# Patient Record
Sex: Female | Born: 1990 | Race: White | Hispanic: No | Marital: Single | Smoking: Current every day smoker
Health system: Southern US, Community
[De-identification: ages and names within clinical notes are randomized; demographics above are authoritative.]

## PROBLEM LIST (undated history)

## (undated) ENCOUNTER — Emergency Department (HOSPITAL_BASED_OUTPATIENT_CLINIC_OR_DEPARTMENT_OTHER): Admission: EM | Payer: Self-pay | Source: Home / Self Care

## (undated) DIAGNOSIS — F4310 Post-traumatic stress disorder, unspecified: Secondary | ICD-10-CM

## (undated) DIAGNOSIS — R569 Unspecified convulsions: Secondary | ICD-10-CM

## (undated) DIAGNOSIS — G039 Meningitis, unspecified: Secondary | ICD-10-CM

## (undated) DIAGNOSIS — G9389 Other specified disorders of brain: Secondary | ICD-10-CM

## (undated) DIAGNOSIS — F319 Bipolar disorder, unspecified: Secondary | ICD-10-CM

## (undated) DIAGNOSIS — F419 Anxiety disorder, unspecified: Secondary | ICD-10-CM

## (undated) HISTORY — PX: WISDOM TOOTH EXTRACTION: SHX21

---
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
## 2016-10-10 ENCOUNTER — Emergency Department (HOSPITAL_COMMUNITY): Payer: BLUE CROSS/BLUE SHIELD

## 2016-10-10 ENCOUNTER — Encounter (HOSPITAL_COMMUNITY): Payer: Self-pay | Admitting: Emergency Medicine

## 2016-10-10 ENCOUNTER — Emergency Department (HOSPITAL_COMMUNITY)
Admission: EM | Admit: 2016-10-10 | Discharge: 2016-10-11 | Disposition: A | Discharge status: Home / Self Care | Payer: BLUE CROSS/BLUE SHIELD | Attending: Emergency Medicine | Admitting: Emergency Medicine

## 2016-10-10 DIAGNOSIS — Y92410 Unspecified street and highway as the place of occurrence of the external cause: Secondary | ICD-10-CM | POA: Insufficient documentation

## 2016-10-10 DIAGNOSIS — M542 Cervicalgia: Secondary | ICD-10-CM

## 2016-10-10 DIAGNOSIS — M546 Pain in thoracic spine: Secondary | ICD-10-CM | POA: Insufficient documentation

## 2016-10-10 DIAGNOSIS — R1011 Right upper quadrant pain: Secondary | ICD-10-CM | POA: Diagnosis not present

## 2016-10-10 DIAGNOSIS — R0789 Other chest pain: Secondary | ICD-10-CM | POA: Insufficient documentation

## 2016-10-10 DIAGNOSIS — Y9389 Activity, other specified: Secondary | ICD-10-CM | POA: Insufficient documentation

## 2016-10-10 DIAGNOSIS — S7001XA Contusion of right hip, initial encounter: Secondary | ICD-10-CM | POA: Insufficient documentation

## 2016-10-10 DIAGNOSIS — Y999 Unspecified external cause status: Secondary | ICD-10-CM | POA: Insufficient documentation

## 2016-10-10 DIAGNOSIS — R1031 Right lower quadrant pain: Secondary | ICD-10-CM | POA: Diagnosis not present

## 2016-10-10 DIAGNOSIS — J982 Interstitial emphysema: Secondary | ICD-10-CM | POA: Diagnosis not present

## 2016-10-10 DIAGNOSIS — S0990XA Unspecified injury of head, initial encounter: Secondary | ICD-10-CM | POA: Diagnosis present

## 2016-10-10 HISTORY — DX: Anxiety disorder, unspecified: F41.9

## 2016-10-10 HISTORY — DX: Meningitis, unspecified: G03.9

## 2016-10-10 LAB — PREGNANCY, URINE: Preg Test, Ur: NEGATIVE
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
## 2016-10-10 MED ORDER — MORPHINE SULFATE (PF) 4 MG/ML IV SOLN
4.0000 mg | Freq: Once | INTRAVENOUS | Status: AC
  Administered 2016-10-10: 4 mg via INTRAVENOUS
  Filled 2016-10-10: qty 1

## 2016-10-10 MED ORDER — IOPAMIDOL (ISOVUE-300) INJECTION 61%
100.0000 mL | Freq: Once | INTRAVENOUS | Status: AC | PRN
  Administered 2016-10-10: 100 mL via INTRAVENOUS
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
## 2016-10-11 MED ORDER — HYDROCODONE-ACETAMINOPHEN 5-325 MG PO TABS: 2.0000 | ORAL_TABLET | ORAL | 0 refills | Status: DC | PRN

## 2016-10-11 MED ORDER — NAPROXEN 500 MG PO TABS: 500.0000 mg | ORAL_TABLET | Freq: Two times a day (BID) | ORAL | 0 refills | Status: DC

## 2016-10-11 MED ORDER — CYCLOBENZAPRINE HCL 10 MG PO TABS: 10.0000 mg | ORAL_TABLET | Freq: Two times a day (BID) | ORAL | 0 refills | Status: DC | PRN

## 2016-10-11 MED ORDER — MORPHINE SULFATE (PF) 4 MG/ML IV SOLN
4.0000 mg | Freq: Once | INTRAVENOUS | Status: AC
  Administered 2016-10-11: 4 mg via INTRAVENOUS
  Filled 2016-10-11: qty 1
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
## 2016-10-11 NOTE — Discharge Instructions (Signed)
Your xrays are negative for any sign of broken bones. You do have a small pneumomediastinum which should resolve on its own. Apply ice to affected area. Increase your mobility. Take Naprosyn and Flexeril as needed for pain and inflammation. Follow up with your primary care provider if your symptoms do not improve. Return to the ED if you experience loss of consciousness, blurry vision, vomiting, loss of control of your bowel and bladder, numbness or tingling in your lower extremities, difficulty breathing/swallowing, worsening chest pain.

## 2016-10-11 NOTE — ED Notes (Signed)
Pt ambulated in hallway with standby assist. Pt tolerated well. Pt had complaints of pain and stiffness on the left side of neck, but was able to move all extremities.

## 2017-03-16 ENCOUNTER — Emergency Department (HOSPITAL_COMMUNITY)
Admission: EM | Admit: 2017-03-16 | Discharge: 2017-03-17 | Disposition: A | Discharge status: Home / Self Care | Payer: BLUE CROSS/BLUE SHIELD | Attending: Emergency Medicine | Admitting: Emergency Medicine

## 2017-03-16 ENCOUNTER — Encounter (HOSPITAL_COMMUNITY): Payer: Self-pay | Admitting: Emergency Medicine

## 2017-03-16 DIAGNOSIS — S92424A Nondisplaced fracture of distal phalanx of right great toe, initial encounter for closed fracture: Secondary | ICD-10-CM | POA: Diagnosis not present

## 2017-03-16 DIAGNOSIS — Y929 Unspecified place or not applicable: Secondary | ICD-10-CM | POA: Diagnosis not present

## 2017-03-16 DIAGNOSIS — Y9389 Activity, other specified: Secondary | ICD-10-CM | POA: Insufficient documentation

## 2017-03-16 DIAGNOSIS — S99921A Unspecified injury of right foot, initial encounter: Secondary | ICD-10-CM | POA: Diagnosis present

## 2017-03-16 DIAGNOSIS — W2203XA Walked into furniture, initial encounter: Secondary | ICD-10-CM | POA: Diagnosis not present

## 2017-03-16 DIAGNOSIS — Y999 Unspecified external cause status: Secondary | ICD-10-CM | POA: Insufficient documentation

## 2017-03-16 MED ORDER — OXYCODONE-ACETAMINOPHEN 5-325 MG PO TABS
1.0000 | ORAL_TABLET | ORAL | Status: DC | PRN
  Administered 2017-03-17: 1 via ORAL

## 2017-03-16 MED ORDER — OXYCODONE-ACETAMINOPHEN 5-325 MG PO TABS
ORAL_TABLET | ORAL | Status: DC
Start: 2017-03-16 — End: 2017-03-17
  Filled 2017-03-16: qty 1
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
## 2017-03-17 ENCOUNTER — Emergency Department (HOSPITAL_COMMUNITY): Payer: BLUE CROSS/BLUE SHIELD

## 2017-03-17 IMAGING — DX DG FOOT COMPLETE 3+V*R*
3 series · 3 of 3 positions shown · non-contrast
Comparison: None.

CLINICAL DATA: Great toe injury

EXAM:
RIGHT FOOT COMPLETE - 3+ VIEW

[foot ap]
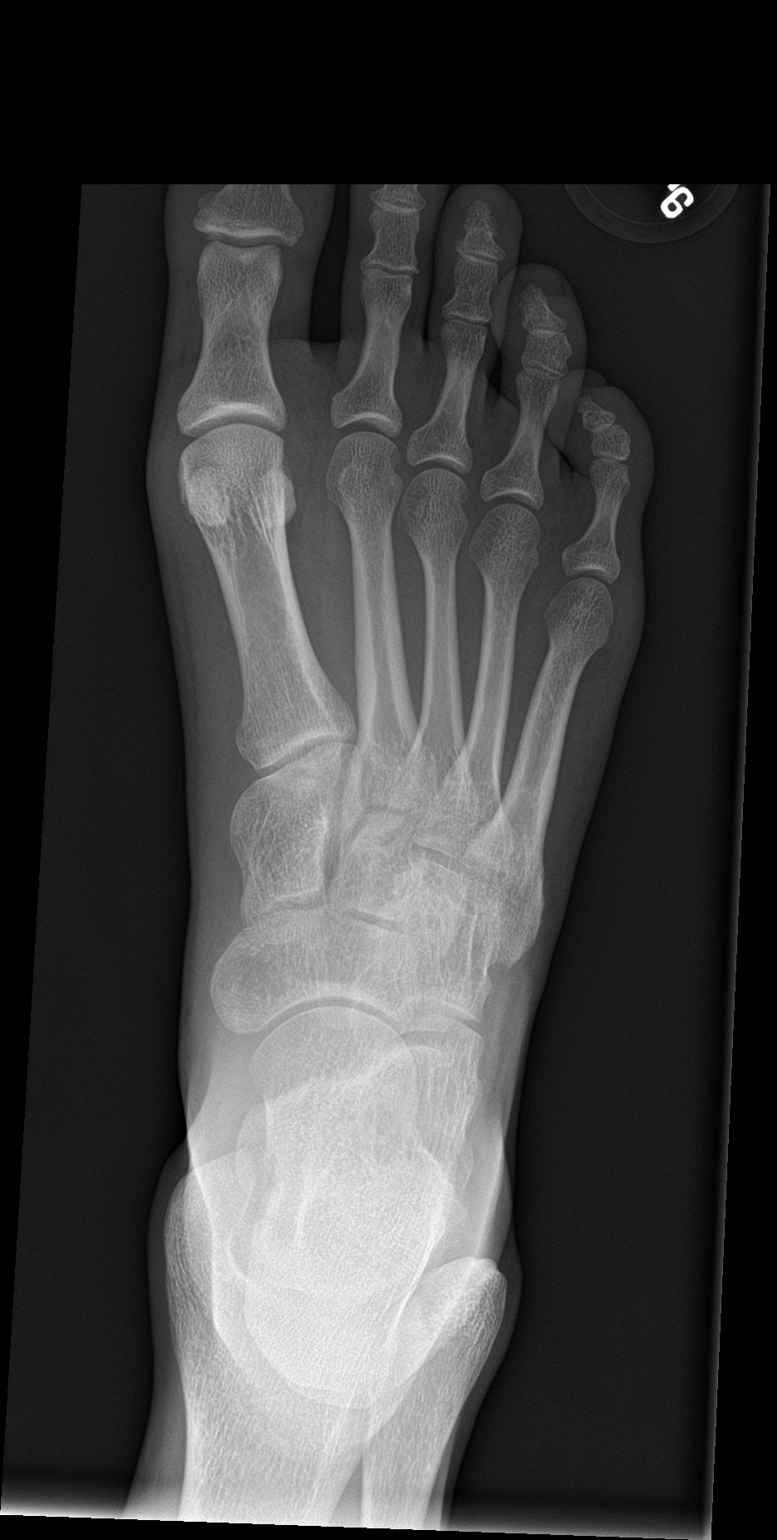

[foot obl]
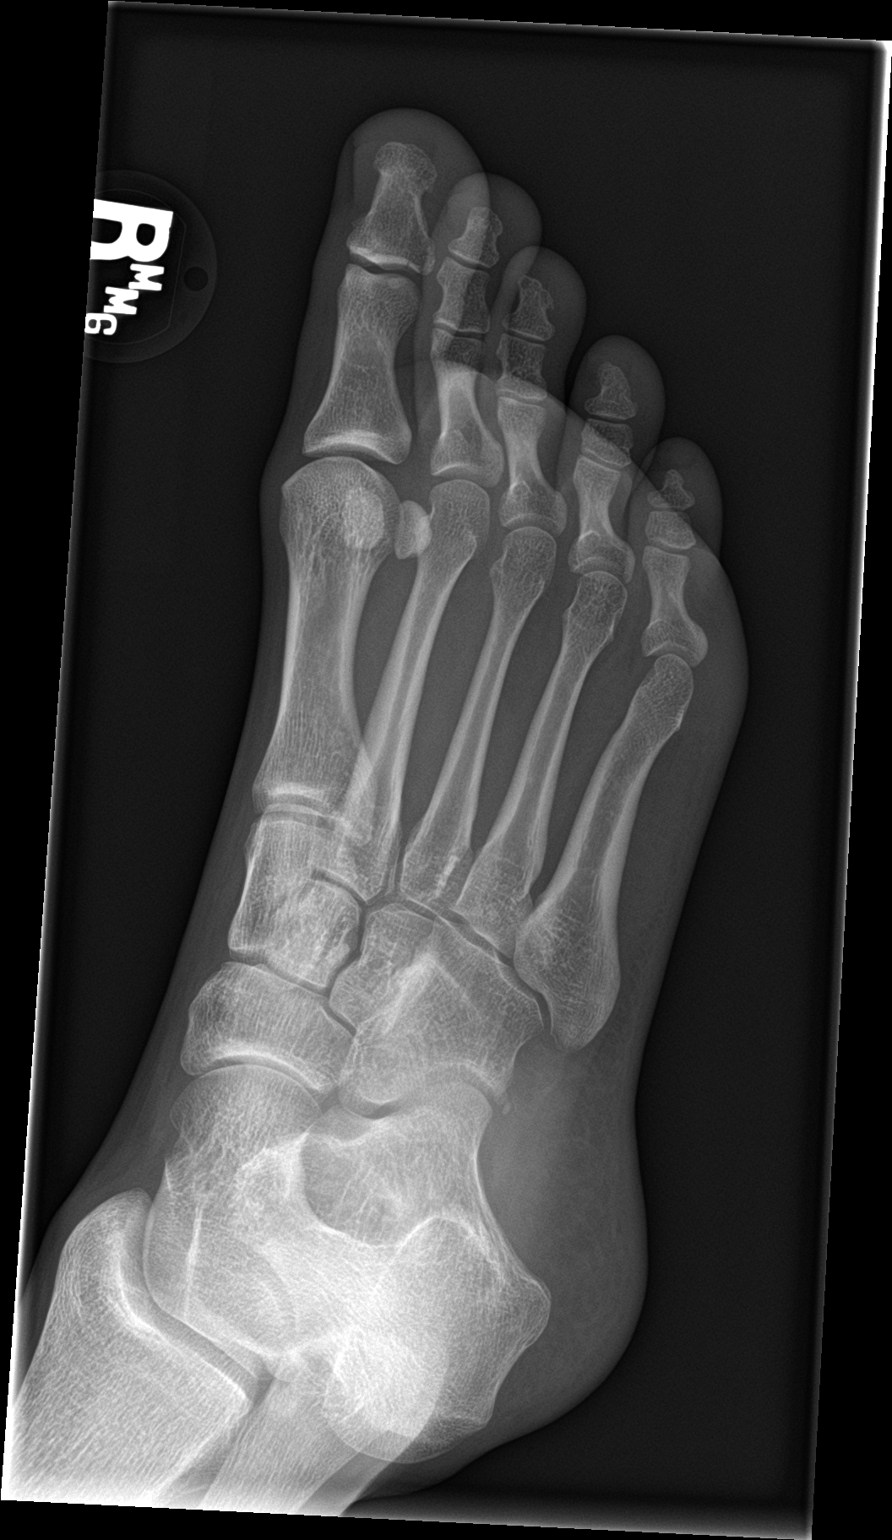

[foot lat]
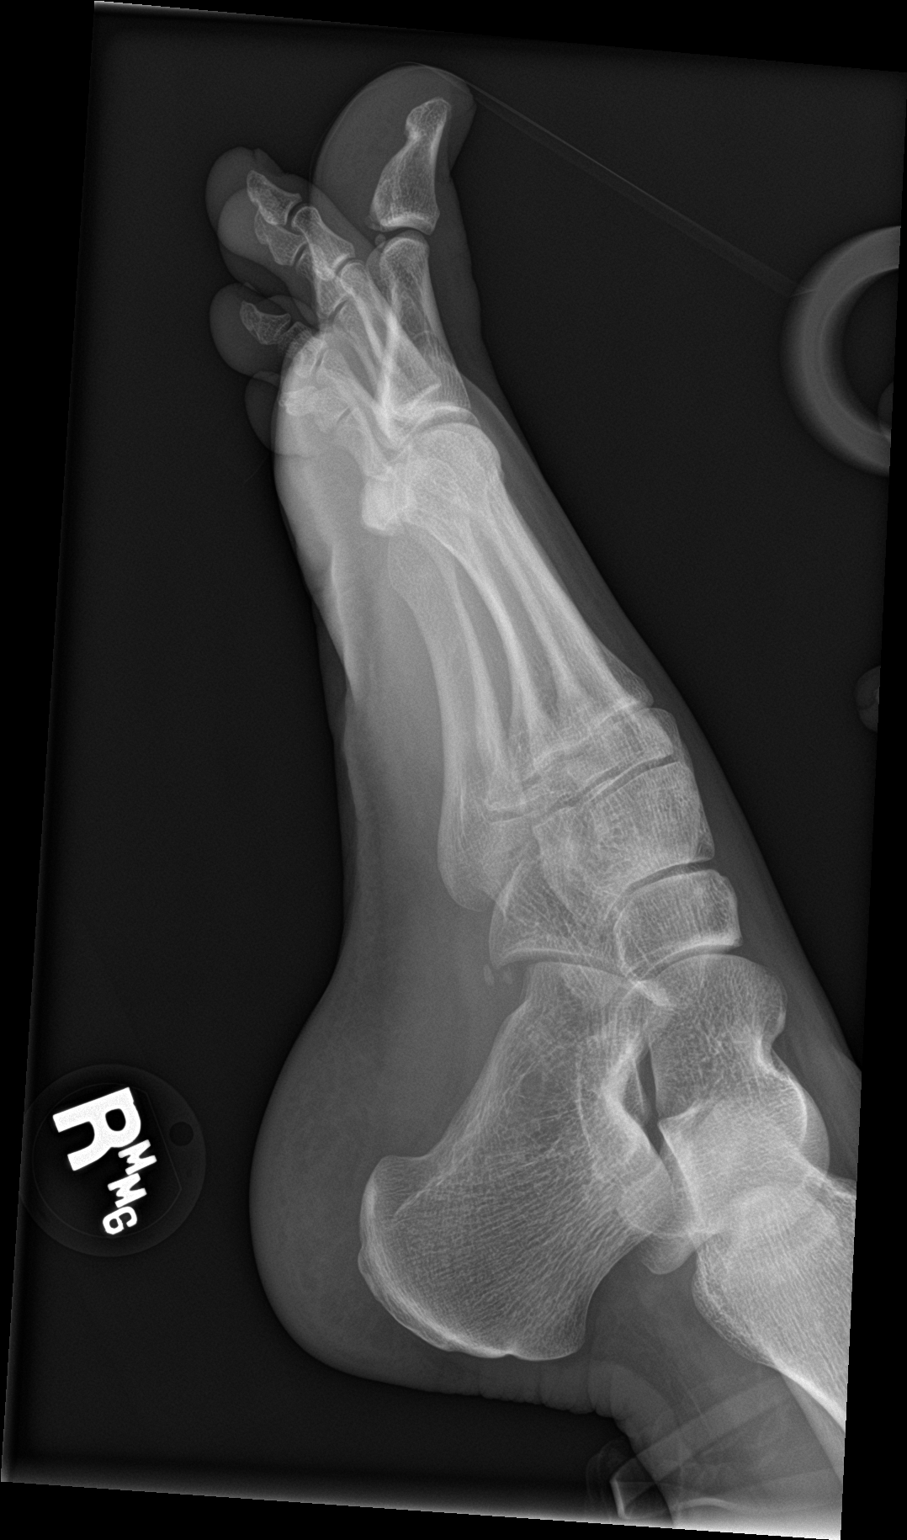

[3 of 3 positions shown; findings below may reference images not displayed]

FINDINGS: Nondisplaced intra-articular fracture involving the lateral aspect
of the first distal phalanx. No subluxation. No radiopaque foreign
body.
IMPRESSION: Nondisplaced intra-articular fracture involving the base of the
first distal phalanx

## 2017-03-17 MED ORDER — OXYCODONE-ACETAMINOPHEN 5-325 MG PO TABS: 1.0000 | ORAL_TABLET | Freq: Three times a day (TID) | ORAL | 0 refills | Status: DC | PRN

## 2017-03-17 NOTE — ED Notes (Signed)
Patient transported to X-ray 

## 2017-03-17 NOTE — ED Notes (Signed)
ED Provider at bedside. 

## 2017-03-17 NOTE — Progress Notes (Signed)
Orthopedic Tech Progress Note Patient Details:  Sonya Prince 17-Dec-1990 [id number]  Ortho Devices Type of Ortho Device: Buddy tape, Postop shoe/boot Ortho Device/Splint Location: rle Ortho Device/Splint Interventions: Ordered, Application   Karolee Stamps 03/17/2017, 2:01 AM

## 2017-03-17 NOTE — ED Notes (Signed)
Pt was wheeled to fast track; before pt got to room pt states she does not like hospitals and was going to leave; Pt took of BP cuff and signed AMA; Pt got half way down the hallway and Yasemia,RN brought pt back; pt is in tears and states she will stay; Pt redirected to assessment room;

## 2017-03-17 NOTE — ED Notes (Signed)
Pt refused crutches at discharge

## 2017-03-17 NOTE — ED Notes (Signed)
Pt request family member to stay in waiting room; Pt very tearful

## 2017-03-17 NOTE — ED Provider Notes (Signed)
[facility] DEPT Provider Note   CSN: [id number] Arrival date & time: 03/16/17  2341     History   Chief Complaint Chief Complaint  Patient presents with  . Toe Pain    HPI Sonya Prince is a 26 y.o. female.  The history is provided by the patient. No language interpreter was used.  Toe Pain  This is a new problem. The current episode started yesterday. The problem occurs constantly. The problem has not changed since onset.She has tried rest (naproxen) for the symptoms. The treatment provided no relief.    Past Medical History:  Diagnosis Date  . Anxiety   . Meningitis     There are no active problems to display for this patient.   Past Surgical History:  Procedure Laterality Date  . WISDOM TOOTH EXTRACTION      OB History    No data available       Home Medications    Prior to Admission medications   Medication Sig Start Date End Date Taking? Authorizing Provider  cyclobenzaprine (FLEXERIL) 10 MG tablet Take 1 tablet (10 mg total) by mouth 2 (two) times daily as needed for muscle spasms. 10/11/16   Samantha Tripp Dowless, PA-C  HYDROcodone-acetaminophen (NORCO/VICODIN) 5-325 MG tablet Take 2 tablets by mouth every 4 (four) hours as needed. 10/11/16   Samantha Tripp Dowless, PA-C  naproxen (NAPROSYN) 500 MG tablet Take 1 tablet (500 mg total) by mouth 2 (two) times daily. 10/11/16   Carlos Levering, PA-C    Family History History reviewed. No pertinent family history.  Social History Social History  Substance Use Topics  . Smoking status: Never Smoker  . Smokeless tobacco: Never Used  . Alcohol use No     Comment: occassionally     Allergies   Patient has no known allergies.   Review of Systems Review of Systems  Musculoskeletal: Positive for arthralgias.  All other systems reviewed and are negative.    Physical Exam Updated Vital Signs BP 121/79 (BP Location: Right Arm)   Pulse 88   Temp 98.3 F (36.8 C) (Oral)   Resp 18    Ht 5\' 2"  (1.575 m)   Wt 46.7 kg   LMP 02/03/2017   SpO2 100%   BMI 18.84 kg/m   Physical Exam  Constitutional: She is oriented to person, place, and time. She appears well-developed and well-nourished.  HENT:  Head: Normocephalic.  Eyes: Conjunctivae are normal.  Neck: Neck supple.  Cardiovascular: Normal rate and regular rhythm.   Pulmonary/Chest: Effort normal and breath sounds normal.  Abdominal: Soft.  Musculoskeletal: She exhibits tenderness.       Right foot: There is tenderness.       Feet:  Lymphadenopathy:    She has no cervical adenopathy.  Neurological: She is alert and oriented to person, place, and time.  Skin: Skin is warm and dry.  Psychiatric: She has a normal mood and affect.  Nursing note and vitals reviewed.    ED Treatments / Results  Labs (all labs ordered are listed, but only abnormal results are displayed) Labs Reviewed - No data to display  EKG  EKG Interpretation None       Radiology Dg Foot Complete Right  Result Date: 03/17/2017 CLINICAL DATA:  Great toe injury EXAM: RIGHT FOOT COMPLETE - 3+ VIEW COMPARISON:  None. FINDINGS: Nondisplaced intra-articular fracture involving the lateral aspect of the first distal phalanx. No subluxation. No radiopaque foreign body. IMPRESSION: Nondisplaced intra-articular fracture involving the base of  the first distal phalanx Electronically Signed   By: Donavan Foil M.D.   On: 03/17/2017 01:15    Procedures Procedures (including critical care time)  SPLINT APPLICATION Date/Time: 2:63 AM Authorized by: Norman Herrlich Consent: Verbal consent obtained. Risks and benefits: risks, benefits and alternatives were discussed Consent given by: patient Splint applied by: Orthopedic technician Location details: distal phalanx right great toe Splint type: buddy taped  1:54 AM  Post-procedure: The splinted body part was neurovascularly unchanged following the procedure. Patient tolerance: Patient tolerated  the procedure well with no immediate complications.    Medications Ordered in ED Medications  oxyCODONE-acetaminophen (PERCOCET/ROXICET) 5-325 MG per tablet 1 tablet (1 tablet Oral Given 03/17/17 0005)  oxyCODONE-acetaminophen (PERCOCET/ROXICET) 5-325 MG per tablet (not administered)     Initial Impression / Assessment and Plan / ED Course  I have reviewed the triage vital signs and the nursing notes.  Pertinent labs & imaging results that were available during my care of the patient were reviewed by me and considered in my medical decision making (see chart for details).     Patient with non-displaced intra-articular distal phalanx fracture of the right great toe. Buddy tape, post-op shoe, crutches with non-weight bearing.  Pt advised to follow up with orthopedics. Care instructions provided.  Patient will be discharged home & is agreeable with above plan. Returns precautions discussed. Pt appears safe for discharge.  Final Clinical Impressions(s) / ED Diagnoses   Final diagnoses:  Closed nondisplaced fracture of distal phalanx of right great toe, initial encounter    New Prescriptions New Prescriptions   OXYCODONE-ACETAMINOPHEN (PERCOCET/ROXICET) 5-325 MG TABLET    Take 1 tablet by mouth every 8 (eight) hours as needed for severe pain.     [name], NP 03/17/17 7858    Merrily Pew, MD 03/17/17 1154

## 2017-04-09 ENCOUNTER — Emergency Department (HOSPITAL_COMMUNITY)
Admission: EM | Admit: 2017-04-09 | Discharge: 2017-04-09 | Disposition: A | Discharge status: Home / Self Care | Payer: BLUE CROSS/BLUE SHIELD | Attending: Emergency Medicine | Admitting: Emergency Medicine

## 2017-04-09 ENCOUNTER — Encounter (HOSPITAL_COMMUNITY): Payer: Self-pay

## 2017-04-09 ENCOUNTER — Emergency Department (HOSPITAL_COMMUNITY): Payer: BLUE CROSS/BLUE SHIELD

## 2017-04-09 DIAGNOSIS — M79671 Pain in right foot: Secondary | ICD-10-CM

## 2017-04-09 DIAGNOSIS — Y92009 Unspecified place in unspecified non-institutional (private) residence as the place of occurrence of the external cause: Secondary | ICD-10-CM | POA: Insufficient documentation

## 2017-04-09 DIAGNOSIS — M25561 Pain in right knee: Secondary | ICD-10-CM

## 2017-04-09 DIAGNOSIS — Y999 Unspecified external cause status: Secondary | ICD-10-CM | POA: Insufficient documentation

## 2017-04-09 DIAGNOSIS — Y939 Activity, unspecified: Secondary | ICD-10-CM | POA: Insufficient documentation

## 2017-04-09 IMAGING — DX DG FOOT COMPLETE 3+V*R*
3 series · 3 of 3 positions shown · non-contrast
Comparison: Right foot radiograph [DATE]

CLINICAL DATA: Right toe pain.

EXAM:
RIGHT FOOT COMPLETE - 3+ VIEW

[foot ap]
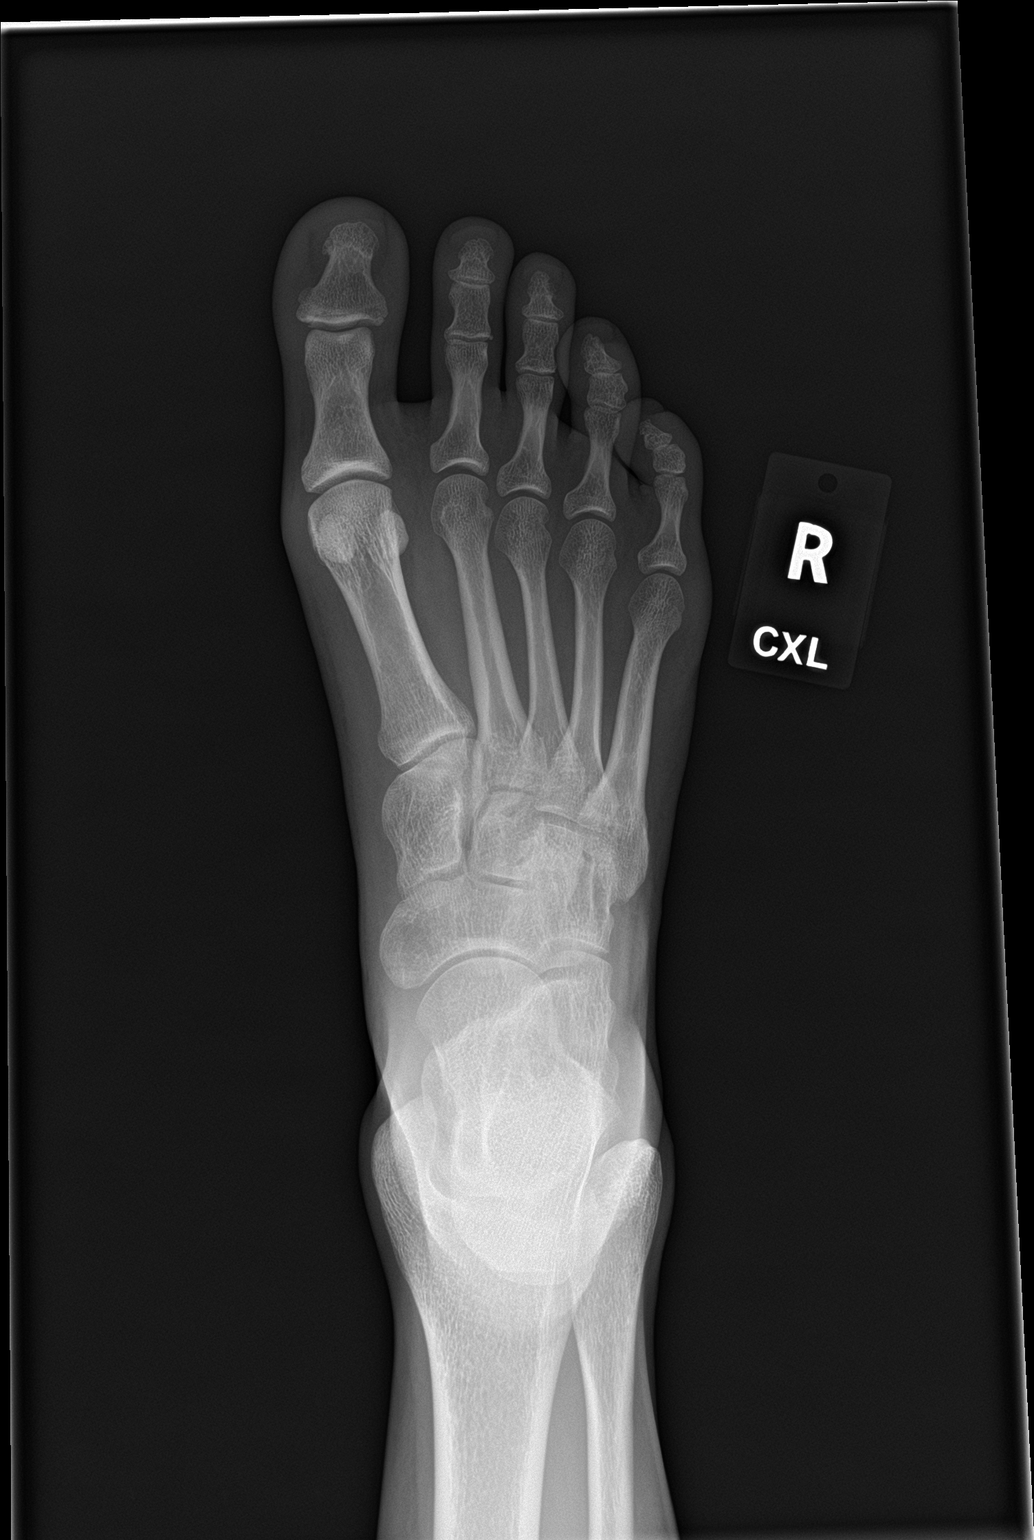

[foot obl]
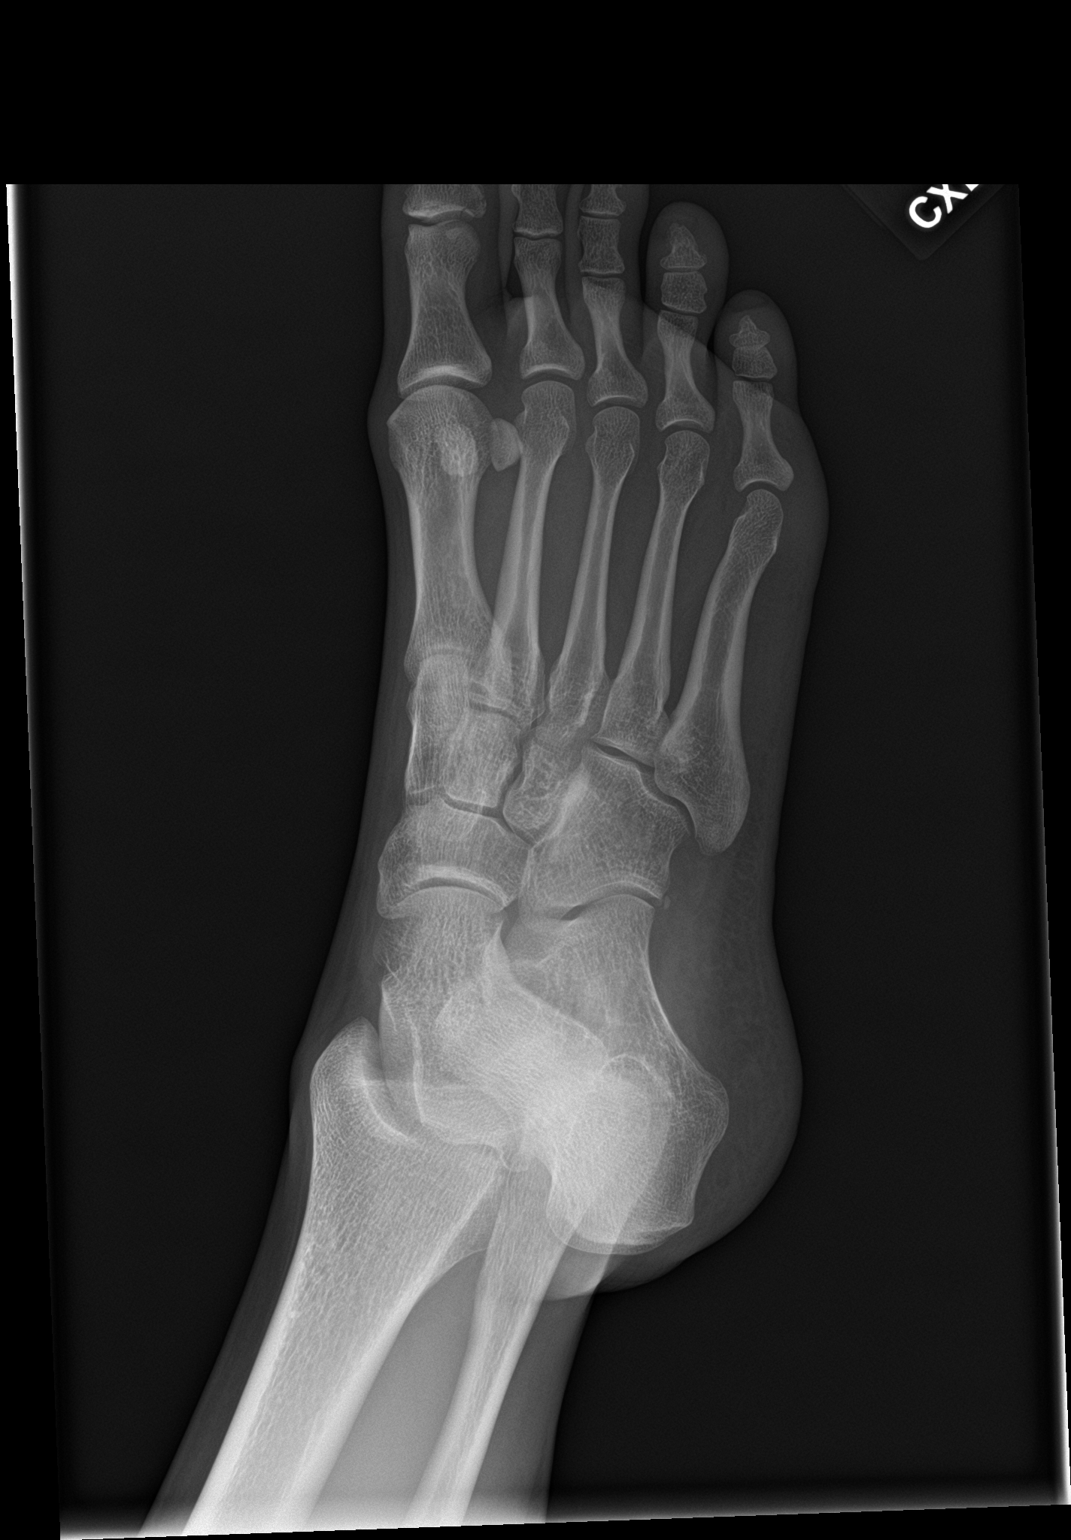

[foot lat]
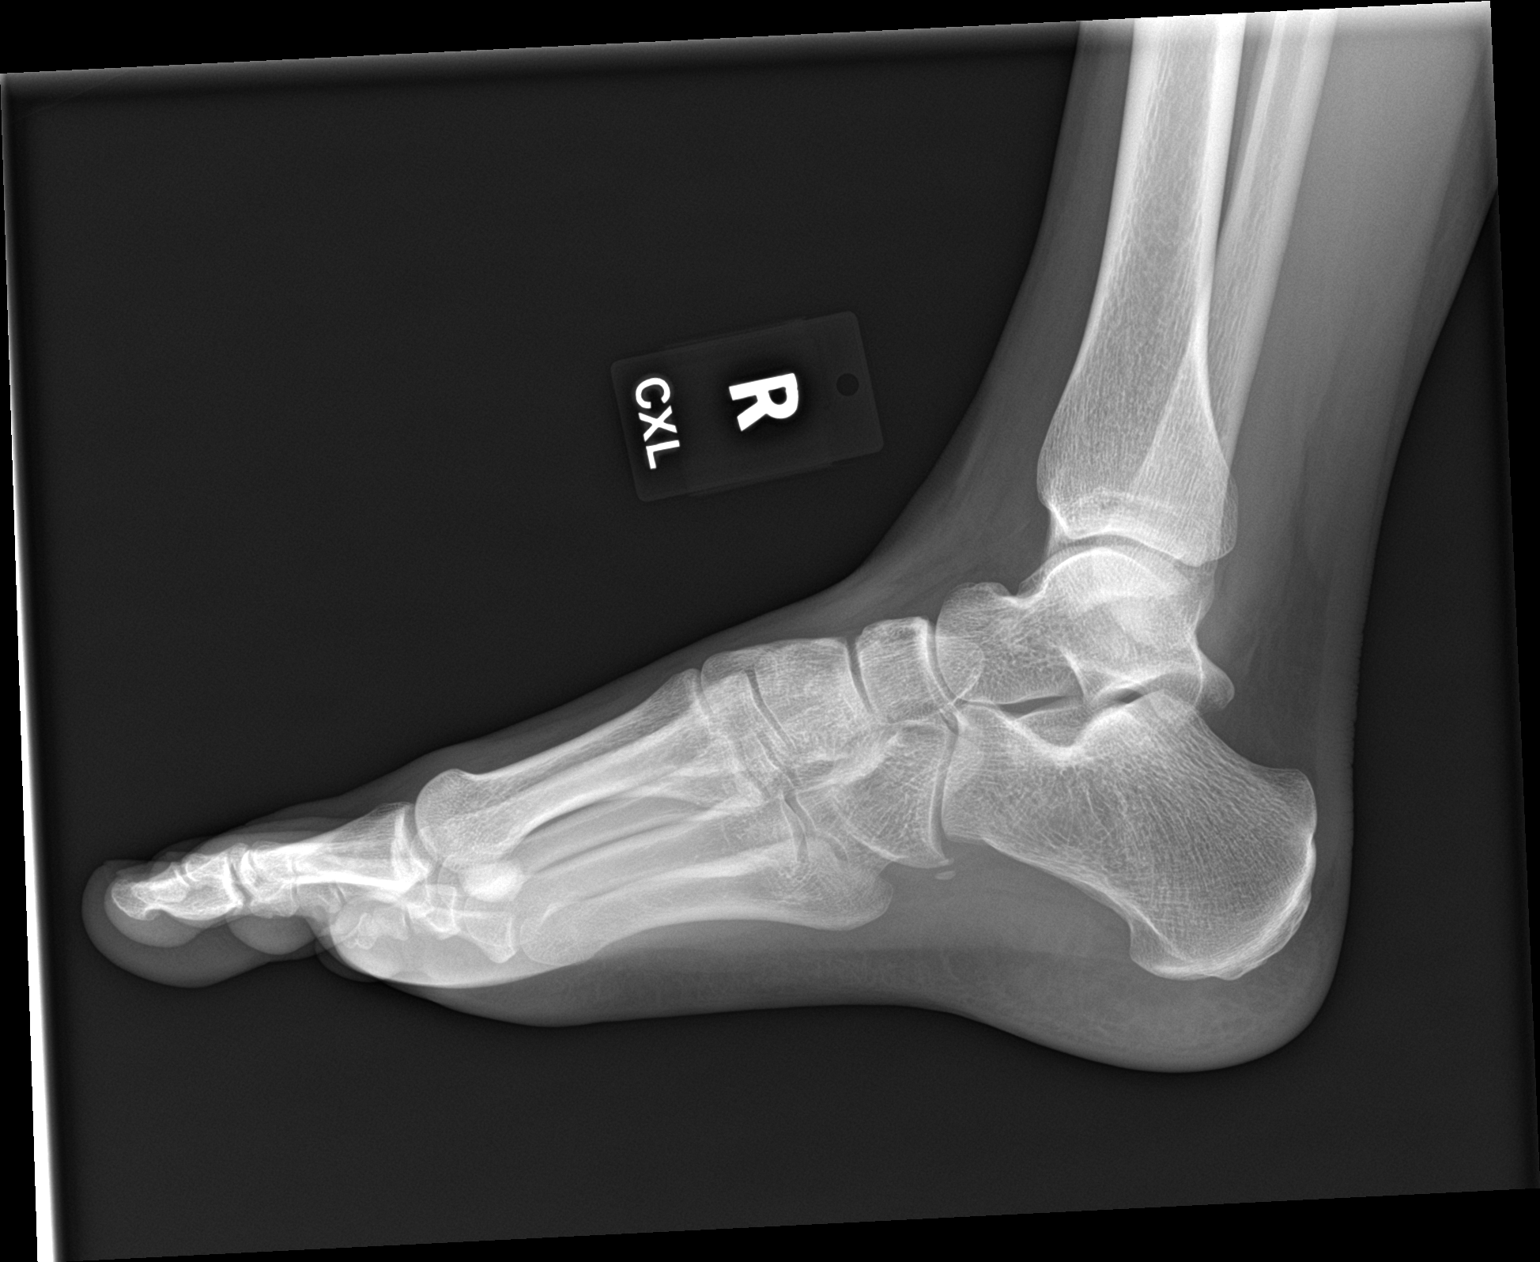

[3 of 3 positions shown; findings below may reference images not displayed]

FINDINGS: Nondisplaced intra-articular fracture at the base of the first
distal phalanx is unchanged. There is no new fracture. The remainder
of the bones of the right foot are normal.
IMPRESSION: Unchanged appearance of nondisplaced intra-articular fracture of the
base of the first distal phalanx of the right foot. No new fracture.
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
## 2017-04-09 MED ORDER — NAPROXEN 375 MG PO TABS: 375.0000 mg | ORAL_TABLET | Freq: Two times a day (BID) | ORAL | 0 refills | Status: DC

## 2017-04-09 MED ORDER — CYCLOBENZAPRINE HCL 10 MG PO TABS: 10.0000 mg | ORAL_TABLET | Freq: Two times a day (BID) | ORAL | 0 refills | Status: DC | PRN

## 2017-04-09 MED ORDER — HYDROCODONE-ACETAMINOPHEN 5-325 MG PO TABS: 1.0000 | ORAL_TABLET | Freq: Four times a day (QID) | ORAL | 0 refills | Status: DC | PRN

## 2017-04-09 MED ORDER — NAPROXEN 250 MG PO TABS
375.0000 mg | ORAL_TABLET | Freq: Once | ORAL | Status: AC
  Administered 2017-04-09: 375 mg via ORAL
  Filled 2017-04-09: qty 2
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
## 2017-04-09 NOTE — Progress Notes (Signed)
Orthopedic Tech Progress Note Patient Details:  Sonya Prince 1991/07/11 [id number]  Ortho Devices Type of Ortho Device: Knee Immobilizer Ortho Device/Splint Location: RLE Ortho Device/Splint Interventions: Ordered, Application   Braulio Bosch 04/09/2017, 5:33 PM
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
## 2017-04-09 NOTE — ED Notes (Addendum)
NP at bedside.

## 2017-04-16 ENCOUNTER — Ambulatory Visit (HOSPITAL_COMMUNITY)
Admission: EM | Admit: 2017-04-16 | Discharge: 2017-04-16 | Disposition: A | Discharge status: Home / Self Care | Payer: BLUE CROSS/BLUE SHIELD | Attending: Internal Medicine | Admitting: Internal Medicine

## 2017-04-16 ENCOUNTER — Encounter (HOSPITAL_COMMUNITY): Payer: Self-pay | Admitting: *Deleted

## 2017-04-16 DIAGNOSIS — S61411A Laceration without foreign body of right hand, initial encounter: Secondary | ICD-10-CM | POA: Diagnosis not present

## 2017-04-16 DIAGNOSIS — S61219A Laceration without foreign body of unspecified finger without damage to nail, initial encounter: Secondary | ICD-10-CM

## 2017-04-16 MED ORDER — LIDOCAINE-EPINEPHRINE-TETRACAINE (LET) SOLUTION
3.0000 mL | Freq: Once | NASAL | Status: AC
  Administered 2017-04-16: 3 mL via TOPICAL

## 2017-04-16 MED ORDER — LIDOCAINE-EPINEPHRINE-TETRACAINE (LET) SOLUTION
NASAL | Status: AC
  Filled 2017-04-16: qty 3

## 2017-04-16 MED ORDER — LIDOCAINE HCL 2 % IJ SOLN
INTRAMUSCULAR | Status: AC
  Filled 2017-04-16: qty 20
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
## 2017-04-16 NOTE — Discharge Instructions (Addendum)
Finger wounds were cleaned today.  The wounds are small and will heal fine.  Wash gently with soap/water 1-2 times daily and apply antibiotic ointment and bandage.  Press firmly for 5-10 minutes, without letting up, to control bleeding, if bleeding occurs in the next 24 hours or so.  Recheck for any increasing redness/swelling/drainage/pain or new fever >100.5.  Anticipate gradual healing over the couple weeks.

## 2017-04-19 NOTE — ED Provider Notes (Signed)
[facility]    CSN: [id number] Arrival date & time: 04/16/17  1443     History   Chief Complaint Chief Complaint  Patient presents with  . Laceration    HPI Sonya Prince is a 26 y.o. female.  Presents today with small lacerations to dorsal right 3rd and 4th PIPs with potato peeler.  Distraught.  No bruising/swelling.  Good ROM hand.  Expresses a lot of pain.  No other injuries reported.    HPI  Past Medical History:  Diagnosis Date  . Anxiety   . Meningitis     Past Surgical History:  Procedure Laterality Date  . WISDOM TOOTH EXTRACTION       Home Medications    Prior to Admission medications   Medication Sig Start Date End Date Taking? Authorizing Provider  cyclobenzaprine (FLEXERIL) 10 MG tablet Take 1 tablet (10 mg total) by mouth 2 (two) times daily as needed for muscle spasms. 04/09/17   [name], NP  HYDROcodone-acetaminophen (NORCO/VICODIN) 5-325 MG tablet Take 1 tablet by mouth every 6 (six) hours as needed for severe pain. 04/09/17   [name], NP  naproxen (NAPROSYN) 375 MG tablet Take 1 tablet (375 mg total) by mouth 2 (two) times daily. 04/09/17   [name], NP  oxyCODONE-acetaminophen (PERCOCET/ROXICET) 5-325 MG tablet Take 1 tablet by mouth every 8 (eight) hours as needed for severe pain. 03/17/17   [name], NP    Family History History reviewed. No pertinent family history.  Social History Social History  Substance Use Topics  . Smoking status: Never Smoker  . Smokeless tobacco: Never Used  . Alcohol use No     Comment: occassionally     Allergies   Patient has no known allergies.   Review of Systems Review of Systems  All other systems reviewed and are negative.    Physical Exam Triage Vital Signs ED Triage Vitals  Enc Vitals Group     BP 04/16/17 1538 (!) 115/94     Pulse Rate 04/16/17 1538 97     Resp 04/16/17 1538 16     Temp 04/16/17 1538 98.3 F (36.8 C)     Temp Source 04/16/17 1538 Oral   SpO2 04/16/17 1538 100 %     Weight --      Height --      Pain Score 04/16/17 1543 3     Pain Loc --    Updated Vital Signs BP (!) 115/94 (BP Location: Left Arm) Comment: rn notified  Pulse 97   Temp 98.3 F (36.8 C) (Oral)   Resp 16   LMP 04/02/2017 (Approximate)   SpO2 100%   Physical Exam  Constitutional: She is oriented to person, place, and time. No distress.  HENT:  Head: Atraumatic.  Eyes:  Conjugate gaze observed, no eye redness/discharge  Neck: Neck supple.  Cardiovascular: Normal rate.   Pulmonary/Chest: No respiratory distress.  Abdominal: She exhibits no distension.  Musculoskeletal: Normal range of motion.  Neurological: She is alert and oriented to person, place, and time.  Skin: Skin is warm and dry.  Able to move hand/fingers/wrist.  Fingers warm with good cap refill.  No bruising/swelling.   Small full thickness avulsions to dorsal right 3rd, 4th PIPs, 81mm at 4th finger, 7-58mm at 3rd finger.    Nursing note and vitals reviewed.    UC Treatments / Results   Procedures Procedures (including critical care time)  Medications Ordered in UC Medications  lidocaine-EPINEPHrine-tetracaine (LET) solution (3 mLs  Topical Given 04/16/17 1608)   Wounds washed thoroughly with hibiclens/saline.  Scant debris removed from 3rd finger wound.  Patient not able to tolerate wound infiltration with lidocaine/suturing.  Antibiotic ointment/bulky dressings applied.    Final Clinical Impressions(s) / UC Diagnoses   Final diagnoses:  Finger laceration, initial encounter   Finger wounds were cleaned today.  The wounds are small and will heal fine.  Wash gently with soap/water 1-2 times daily and apply antibiotic ointment and bandage.  Press firmly for 5-10 minutes, without letting up, to control bleeding, if bleeding occurs in the next 24 hours or so.  Recheck for any increasing redness/swelling/drainage/pain or new fever >100.5.  Anticipate gradual healing over the couple  weeks.   Sherlene Shams, MD 04/19/17 [phone]

## 2017-07-04 ENCOUNTER — Encounter (HOSPITAL_COMMUNITY): Payer: Self-pay | Admitting: Emergency Medicine

## 2017-07-04 ENCOUNTER — Ambulatory Visit (HOSPITAL_COMMUNITY)
Admission: AD | Admit: 2017-07-04 | Discharge: 2017-07-04 | Disposition: A | Discharge status: Home / Self Care | Payer: Self-pay | Attending: Psychiatry | Admitting: Psychiatry

## 2017-07-04 ENCOUNTER — Emergency Department (HOSPITAL_COMMUNITY)
Admission: EM | Admit: 2017-07-04 | Discharge: 2017-07-05 | Disposition: A | Discharge status: Home / Self Care | Payer: Self-pay | Attending: Emergency Medicine | Admitting: Emergency Medicine

## 2017-07-04 DIAGNOSIS — F309 Manic episode, unspecified: Secondary | ICD-10-CM | POA: Insufficient documentation

## 2017-07-04 DIAGNOSIS — R45851 Suicidal ideations: Secondary | ICD-10-CM | POA: Insufficient documentation

## 2017-07-04 DIAGNOSIS — Z79899 Other long term (current) drug therapy: Secondary | ICD-10-CM | POA: Insufficient documentation

## 2017-07-04 DIAGNOSIS — R109 Unspecified abdominal pain: Secondary | ICD-10-CM | POA: Insufficient documentation

## 2017-07-04 DIAGNOSIS — F329 Major depressive disorder, single episode, unspecified: Secondary | ICD-10-CM | POA: Insufficient documentation

## 2017-07-04 DIAGNOSIS — F909 Attention-deficit hyperactivity disorder, unspecified type: Secondary | ICD-10-CM | POA: Insufficient documentation

## 2017-07-04 DIAGNOSIS — F3181 Bipolar II disorder: Secondary | ICD-10-CM | POA: Diagnosis present

## 2017-07-04 HISTORY — DX: Post-traumatic stress disorder, unspecified: F43.10

## 2017-07-04 LAB — COMPREHENSIVE METABOLIC PANEL
ALBUMIN: 4.8 g/dL (ref 3.5–5.0)
ALT: 27 U/L (ref 14–54)
ANION GAP: 8 (ref 5–15)
AST: 24 U/L (ref 15–41)
Alkaline Phosphatase: 43 U/L (ref 38–126)
BILIRUBIN TOTAL: 0.5 mg/dL (ref 0.3–1.2)
BUN: 15 mg/dL (ref 6–20)
CO2: 26 mmol/L (ref 22–32)
Calcium: 9.6 mg/dL (ref 8.9–10.3)
Chloride: 105 mmol/L (ref 101–111)
Creatinine, Ser: 0.58 mg/dL (ref 0.44–1.00)
GFR calc Af Amer: >60 mL/min (ref >60)
GFR calc non Af Amer: >60 mL/min (ref >60)
GLUCOSE: 120 mg/dL — AB (ref 65–99)
POTASSIUM: 3.4 mmol/L — AB (ref 3.5–5.1)
Sodium: 139 mmol/L (ref 135–145)
TOTAL PROTEIN: 7.8 g/dL (ref 6.5–8.1)

## 2017-07-04 LAB — URINALYSIS, ROUTINE W REFLEX MICROSCOPIC
BILIRUBIN URINE: NEGATIVE
GLUCOSE, UA: NEGATIVE mg/dL
HGB URINE DIPSTICK: NEGATIVE
Ketones, ur: NEGATIVE mg/dL
Leukocytes, UA: NEGATIVE
Nitrite: NEGATIVE
PROTEIN: NEGATIVE mg/dL
Specific Gravity, Urine: 1.014 (ref 1.005–1.030)
pH: 6 (ref 5.0–8.0)

## 2017-07-04 LAB — WET PREP, GENITAL
Clue Cells Wet Prep HPF POC: NONE SEEN
Sperm: NONE SEEN
Trich, Wet Prep: NONE SEEN
Yeast Wet Prep HPF POC: NONE SEEN

## 2017-07-04 LAB — RAPID URINE DRUG SCREEN, HOSP PERFORMED
Amphetamines: NOT DETECTED
Barbiturates: NOT DETECTED
Benzodiazepines: NOT DETECTED
Cocaine: NOT DETECTED
Opiates: NOT DETECTED
Tetrahydrocannabinol: NOT DETECTED

## 2017-07-04 LAB — CBC
HCT: 40.7 % (ref 36.0–46.0)
Hemoglobin: 14.2 g/dL (ref 12.0–15.0)
MCH: 32.2 pg (ref 26.0–34.0)
MCHC: 34.9 g/dL (ref 30.0–36.0)
MCV: 92.3 fL (ref 78.0–100.0)
Platelets: 222 10*3/uL (ref 150–400)
RBC: 4.41 MIL/uL (ref 3.87–5.11)
RDW: 12.1 % (ref 11.5–15.5)
WBC: 10 10*3/uL (ref 4.0–10.5)

## 2017-07-04 LAB — ACETAMINOPHEN LEVEL: Acetaminophen (Tylenol), Serum: <10 ug/mL — ABNORMAL LOW (ref 10–30)

## 2017-07-04 LAB — SYPHILIS: RPR W/REFLEX TO RPR TITER AND TREPONEMAL ANTIBODIES, TRADITIONAL SCREENING AND DIAGNOSIS ALGORITHM: RPR Ser Ql: NONREACTIVE

## 2017-07-04 LAB — SALICYLATE LEVEL

## 2017-07-04 LAB — POC URINE PREG, ED: Preg Test, Ur: NEGATIVE

## 2017-07-04 LAB — ETHANOL: Alcohol, Ethyl (B): <5 mg/dL

## 2017-07-04 MED ORDER — OLANZAPINE 2.5 MG PO TABS
2.5000 mg | ORAL_TABLET | Freq: Once | ORAL | Status: AC
  Administered 2017-07-04: 2.5 mg via ORAL
  Filled 2017-07-04: qty 1
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
## 2017-07-04 NOTE — ED Notes (Signed)
SBAR Report received from previous nurse. Pt received calm and visible on unit. Pt denies current SI/ HI, A/V H, depression, anxiety, or pain at this time, and appears otherwise stable and free of distress though pt speaking rapidly and pressured. Pt reminded of camera surveillance, q 15 min rounds, and rules of the milieu. Will continue to assess.

## 2017-07-04 NOTE — ED Provider Notes (Addendum)
TIME SEEN: 5:36 AM  CHIEF COMPLAINT: Medical clearance  HPI: Patient is a 26 year old female with history of PTSD, anxiety who was sent over from behavioral health hospital for medical clearance.  Patient went to behavioral health because she states she has had a lot of stress in her life. She clearly appears to be manic and they have recommended inpatient psychiatric treatment which she agrees to voluntarily. Was sent here for medical clearance. She states she has had some lower pelvic pain over the past several days, abnormal vaginal discharge. Was just treated for bacterial vaginosis and finished antibodies 2 days ago. Denies fevers, chills, dysuria, vomiting, diarrhea, vaginal bleeding. No abdominal surgeries.  ROS: See HPI Constitutional: no fever  Eyes: no drainage  ENT: no runny nose   Cardiovascular:  no chest pain  Resp: no SOB  GI: no vomiting GU: no dysuria Integumentary: no rash  Allergy: no hives  Musculoskeletal: no leg swelling  Neurological: no slurred speech ROS otherwise negative  PAST MEDICAL HISTORY/PAST SURGICAL HISTORY:  Past Medical History:  Diagnosis Date  . Anxiety   . Meningitis   . PTSD (post-traumatic stress disorder)     MEDICATIONS:  Prior to Admission medications   Medication Sig Start Date End Date Taking? Authorizing Provider  cyclobenzaprine (FLEXERIL) 10 MG tablet Take 1 tablet (10 mg total) by mouth 2 (two) times daily as needed for muscle spasms. 04/09/17   [name], NP  HYDROcodone-acetaminophen (NORCO/VICODIN) 5-325 MG tablet Take 1 tablet by mouth every 6 (six) hours as needed for severe pain. 04/09/17   [name], NP  naproxen (NAPROSYN) 375 MG tablet Take 1 tablet (375 mg total) by mouth 2 (two) times daily. 04/09/17   [name], NP  oxyCODONE-acetaminophen (PERCOCET/ROXICET) 5-325 MG tablet Take 1 tablet by mouth every 8 (eight) hours as needed for severe pain. 03/17/17   [name], NP    ALLERGIES:  No Known  Allergies  SOCIAL HISTORY:  Social History  Substance Use Topics  . Smoking status: Never Smoker  . Smokeless tobacco: Never Used  . Alcohol use Yes     Comment: occassionally    FAMILY HISTORY: History reviewed. No pertinent family history.  EXAM: BP 127/76 (BP Location: Right Arm)   Pulse 79   Temp 98.2 F (36.8 C) (Oral)   Resp 18   SpO2 96%  CONSTITUTIONAL: Alert and oriented and responds appropriately to questions. Well-appearing; well-nourished HEAD: Normocephalic EYES: Conjunctivae clear, pupils appear equal, EOMI ENT: normal nose; moist mucous membranes NECK: Supple, no meningismus, no nuchal rigidity, no LAD  CARD: RRR; S1 and S2 appreciated; no murmurs, no clicks, no rubs, no gallops RESP: Normal chest excursion without splinting or tachypnea; breath sounds clear and equal bilaterally; no wheezes, no rhonchi, no rales, no hypoxia or respiratory distress, speaking full sentences ABD/GI: Normal bowel sounds; non-distended; soft, non-tender, no rebound, no guarding, no peritoneal signs, no hepatosplenomegaly GU:  Normal external genitalia. No lesions, rashes noted. Patient has no vaginal bleeding on exam. Mild amount of thin white vaginal discharge.  No adnexal tenderness, mass or fullness, no cervical motion tenderness. Cervix is not appear friable.  Cervix is closed.  Chaperone present for exam. BACK:  The back appears normal and is non-tender to palpation, there is no CVA tenderness EXT: Normal ROM in all joints; non-tender to palpation; no edema; normal capillary refill; no cyanosis, no calf tenderness or swelling    SKIN: Normal color for age and race; warm; no rash NEURO: Moves  all extremities equally PSYCH: Patient has poor insight. She has tangential thought process, rapid speech and appears manic. She denies SI, HI or hallucinations.  MEDICAL DECISION MAKING: Patient several here from behavioral health for medical clearance. She was seen there for manic behavior  and they recommended inpatient treatment. She agrees to go voluntarily and she is comfort with this plan. She is complaining of pain with vaginal discharge. Agrees to pelvic exam. Will obtain screening labs, urine and perform pelvic cultures, HIV and syphilis testing today. Abdominal exam is benign. Doubt appendicitis, colitis, diverticulitis, bowel obstruction, perforation based on her benign exam.  ED PROGRESS: Patient's pelvic exam is unremarkable. Labs also normal. Urine shows no sign of infection. Pregnancy test negative. Wet prep is unremarkable. I do not think that she has a TOA, PID, torsion based on her benign exam. She had no cervical motion tenderness, adnexal tenderness. I feel she is medically cleared and can be placed to inpatient psychiatric facility. Recommended follow up with her outpatient OBGYN follow up.  I do not feel any life threatening process is present.  She is here voluntarily. She states she wants psychiatric placement. She states she feels that she does not get psychiatric help she will "snap".  I reviewed all nursing notes, vitals, pertinent previous records, EKGs, lab and urine results, imaging (as available).      Katrinna Travieso, Delice Bison, DO 07/04/17 [place], Delice Bison, DO 07/04/17 [phone]

## 2017-07-04 NOTE — ED Notes (Signed)
Pt tangential on approach, speech rapid, pressured,tearful at times, laughing inappropriately at times. Pt requesting inpatient admission.  Pt reports "I'm so stressed out." Encouragement and support provided. When this nurse was exiting pt room pt states "I just want some chocolate." Snack offered to pt. Will continue to monitor.

## 2017-07-04 NOTE — H&P (Signed)
Behavioral Health Medical Screening Exam  Sonya Prince is an 26 y.o. female.  Total Time spent with patient: 30 minutes  Psychiatric Specialty Exam: Physical Exam  Constitutional: She is oriented to person, place, and time. She appears well-developed and well-nourished. No distress.  HENT:  Head: Normocephalic and atraumatic.  Right Ear: External ear normal.  Left Ear: External ear normal.  Eyes: Conjunctivae are normal. Right eye exhibits no discharge. Left eye exhibits no discharge. No scleral icterus.  Neck: Normal range of motion.  Cardiovascular: Normal rate.   Respiratory: Effort normal. No respiratory distress.  GI: There is tenderness.  Musculoskeletal: Normal range of motion.  Neurological: She is alert and oriented to person, place, and time.  Skin: Skin is warm and dry. She is not diaphoretic.  Psychiatric: Her mood appears anxious. Her affect is labile. Her speech is rapid and/or pressured. She is hyperactive. She is not actively hallucinating. Thought content is not paranoid and not delusional. Cognition and memory are normal. She expresses impulsivity and inappropriate judgment. She exhibits a depressed mood. She expresses suicidal ideation. She expresses no homicidal ideation. She expresses no suicidal plans.    Review of Systems  Constitutional: Negative for chills and fever.  Gastrointestinal: Positive for abdominal pain. Negative for nausea and vomiting.       Patient reports lower abdominal and vaginal pain. States that she completed treatment for BV two days ago, but feels she may have waited too long to seek treatment.   Psychiatric/Behavioral: Positive for depression and suicidal ideas. Negative for hallucinations, memory loss and substance abuse. The patient is nervous/anxious and has insomnia.   All other systems reviewed and are negative.   Blood pressure 127/89, pulse 84, temperature 98.6 F (37 C), resp. rate 20, SpO2 100 %.There is no height or weight on  file to calculate BMI.  General Appearance: Disheveled  Eye Contact:  Fair  Speech:  Pressured  Volume:  Increased  Mood:  Angry, Anxious, Depressed, Hopeless, Irritable and Worthless  Affect:  Congruent and Labile  Thought Process:  Disorganized  Orientation:  Full (Time, Place, and Person)  Thought Content:  Hallucinations: None, Rumination and Tangential  Suicidal Thoughts:  Denies current suicidal thoughts. reprots passive suicidal thoughts with no intent or plan.   Homicidal Thoughts:  No  Memory:  Immediate;   Good Recent;   Fair Remote;   Fair  Judgement:  Fair  Insight:  Lacking  Psychomotor Activity:  Increased and Restlessness  Concentration: Concentration: Poor and Attention Span: Poor  Recall:  [employer] of Knowledge:Good  Language: Good  Akathisia:  NA  Handed:  Right  AIMS (if indicated):     Assets:  Communication Skills Desire for Improvement Housing Physical Health Transportation  Sleep:       Musculoskeletal: Strength & Muscle Tone: within normal limits Gait & Station: normal   Blood pressure 127/89, pulse 84, temperature 98.6 F (37 C), resp. rate 20, SpO2 100 %.  Recommendations:  Based on my evaluation the patient does not appear to have an emergency medical condition. Recommend inpatient after medical clearance.  Rozetta Nunnery, NP 07/04/2017, 4:54 AM

## 2017-07-04 NOTE — Progress Notes (Signed)
Per Lindon Romp, NP pt meets criteria for inpt hospitalization. Pt will need to be sent to [facility] for medical clearance and to await placement. Charge nurse Terri, RN contacted and notified of the pt being sent to [facility]. Per [facility], RN [facility] does not currently have any beds available for the pt. TTS to seek placement.  Lind Covert, MSW, [place] TTS Specialist [phone]

## 2017-07-04 NOTE — ED Notes (Signed)
Pt b/f at bedside visiting with pt.
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
## 2017-07-04 NOTE — ED Notes (Signed)
She is awake, alert and in no distress. She had been napping. She eats breakfast without difficulty. 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

## 2017-07-04 NOTE — ED Notes (Signed)
Pt admitted to room #39. Pt drowsy, reports she is tired. Pt reports she has not been sleeping good. "I need a new fuckin life." Pt reports "having difficulty thinking clearly.". Speech pressured, tangential on approach. Pt denies SI/HI/AVH. Special checks Q 15 mins in place for safety, Video monitoring in place. Will continue to monitor.

## 2017-07-05 ENCOUNTER — Encounter (HOSPITAL_COMMUNITY): Payer: Self-pay | Admitting: Registered Nurse

## 2017-07-05 DIAGNOSIS — F309 Manic episode, unspecified: Secondary | ICD-10-CM

## 2017-07-05 DIAGNOSIS — F3181 Bipolar II disorder: Secondary | ICD-10-CM

## 2017-07-05 DIAGNOSIS — F1290 Cannabis use, unspecified, uncomplicated: Secondary | ICD-10-CM

## 2017-07-05 DIAGNOSIS — F1099 Alcohol use, unspecified with unspecified alcohol-induced disorder: Secondary | ICD-10-CM

## 2017-07-05 MED ORDER — LAMOTRIGINE 25 MG PO TABS
25.0000 mg | ORAL_TABLET | Freq: Every day | ORAL | Status: DC
  Administered 2017-07-05: 25 mg via ORAL
  Filled 2017-07-05: qty 1

## 2017-07-05 MED ORDER — OLANZAPINE 5 MG PO TABS: 5.0000 mg | ORAL_TABLET | Freq: Every day | ORAL | Status: DC

## 2017-07-05 NOTE — Consult Note (Signed)
[facility] Consult   Reason for Consult:  Anxious/Depression Referring Physician:  EDP Patient Identification: Sonya Prince MRN:  [id number] Principal Diagnosis: Bipolar 2 disorder, major depressive episode [facility]) Diagnosis:   Patient Active Problem List   Diagnosis Date Noted  . Bipolar 2 disorder, major depressive episode ([place]) [F31.81] 07/05/2017    Total Time spent with patient: 45 minutes  Subjective:   Sonya Prince is a 26 y.o. female patient presents to [facility] with complaints depression and anxiety.  HPI:  patient seen by Dr. Darleene Cleaver and this provider.  Chart reviewed and face to face evaluation on 07/05/17.   On evaluation:  Sonya Prince reports that she was feeling overwhelmed related to "I am just under a lot of stress,  feeling overwhelmed and burnt out on my job."  Patient reports that she wants to have adjustments done to her medications.  Outpatient services with Dr Toy Care and has an upcoming appointment in one month but doesn't want to wait that long.  Wants to be started on Zyprexa.  Patient denies suicidal/homicidal ideation, psychosis, and paranoia.     Past Psychiatric History: Prior history of substance abuse, ADHD, PTSD, PMDD, Bipolar disorder  Risk to Self: Is patient at risk for suicide?: No, but patient needs Medical Clearance Risk to Others:   Prior Inpatient Therapy:   Prior Outpatient Therapy:    Past Medical History:  Past Medical History:  Diagnosis Date  . Anxiety   . Meningitis   . PTSD (post-traumatic stress disorder)     Past Surgical History:  Procedure Laterality Date  . WISDOM TOOTH EXTRACTION     Family History: History reviewed. No pertinent family history. Family Psychiatric  History: Denies Social History:  History  Alcohol Use  . Yes    Comment: occassionally     History  Drug Use  . Types: Marijuana    Social History   Social History  . Marital status: Single    Spouse name: N/A  . Number of  children: N/A  . Years of education: N/A   Social History Main Topics  . Smoking status: Never Smoker  . Smokeless tobacco: Never Used  . Alcohol use Yes     Comment: occassionally  . Drug use: Yes    Types: Marijuana  . Sexual activity: Yes   Other Topics Concern  . None   Social History Narrative  . None   Additional Social History:    Allergies:  No Known Allergies  Labs:  Results for orders placed or performed during the hospital encounter of 07/04/17 (from the past 48 hour(s))  Comprehensive metabolic panel     Status: Abnormal   Collection Time: 07/04/17  5:41 AM  Result Value Ref Range   Sodium 139 135 - 145 mmol/L   Potassium 3.4 (L) 3.5 - 5.1 mmol/L   Chloride 105 101 - 111 mmol/L   CO2 26 22 - 32 mmol/L   Glucose, Bld 120 (H) 65 - 99 mg/dL   BUN 15 6 - 20 mg/dL   Creatinine, Ser 0.58 0.44 - 1.00 mg/dL   Calcium 9.6 8.9 - 10.3 mg/dL   Total Protein 7.8 6.5 - 8.1 g/dL   Albumin 4.8 3.5 - 5.0 g/dL   AST 24 15 - 41 U/L   ALT 27 14 - 54 U/L   Alkaline Phosphatase 43 38 - 126 U/L   Total Bilirubin 0.5 0.3 - 1.2 mg/dL   GFR calc non Af Amer >60 >60 mL/min  GFR calc Af Amer >60 >60 mL/min    Comment: (NOTE) The eGFR has been calculated using the CKD EPI equation. This calculation has not been validated in all clinical situations. eGFR's persistently <60 mL/min signify possible Chronic Kidney Disease.    Anion gap 8 5 - 15  Ethanol     Status: None   Collection Time: 07/04/17  5:41 AM  Result Value Ref Range   Alcohol, Ethyl (B) <5 <5 mg/dL    Comment:        LOWEST DETECTABLE LIMIT FOR SERUM ALCOHOL IS 5 mg/dL FOR MEDICAL PURPOSES ONLY   Salicylate level     Status: None   Collection Time: 07/04/17  5:41 AM  Result Value Ref Range   Salicylate Lvl <6.6 2.8 - 30.0 mg/dL  Acetaminophen level     Status: Abnormal   Collection Time: 07/04/17  5:41 AM  Result Value Ref Range   Acetaminophen (Tylenol), Serum <10 (L) 10 - 30 ug/mL    Comment:         THERAPEUTIC CONCENTRATIONS VARY SIGNIFICANTLY. A RANGE OF 10-30 ug/mL MAY BE AN EFFECTIVE CONCENTRATION FOR MANY PATIENTS. HOWEVER, SOME ARE BEST TREATED AT CONCENTRATIONS OUTSIDE THIS RANGE. ACETAMINOPHEN CONCENTRATIONS >150 ug/mL AT 4 HOURS AFTER INGESTION AND >50 ug/mL AT 12 HOURS AFTER INGESTION ARE OFTEN ASSOCIATED WITH TOXIC REACTIONS.   cbc     Status: None   Collection Time: 07/04/17  5:41 AM  Result Value Ref Range   WBC 10.0 4.0 - 10.5 K/uL   RBC 4.41 3.87 - 5.11 MIL/uL   Hemoglobin 14.2 12.0 - 15.0 g/dL   HCT 40.7 36.0 - 46.0 %   MCV 92.3 78.0 - 100.0 fL   MCH 32.2 26.0 - 34.0 pg   MCHC 34.9 30.0 - 36.0 g/dL   RDW 12.1 11.5 - 15.5 %   Platelets 222 150 - 400 K/uL  RPR     Status: None   Collection Time: 07/04/17  5:41 AM  Result Value Ref Range   RPR Ser Ql Non Reactive Non Reactive    Comment: (NOTE) Performed At: [facility] [place], [place] [id number] Lindon Romp MD [id number]   Rapid urine drug screen (hospital performed)     Status: None   Collection Time: 07/04/17  5:44 AM  Result Value Ref Range   Opiates NONE DETECTED NONE DETECTED   Cocaine NONE DETECTED NONE DETECTED   Benzodiazepines NONE DETECTED NONE DETECTED   Amphetamines NONE DETECTED NONE DETECTED   Tetrahydrocannabinol NONE DETECTED NONE DETECTED   Barbiturates NONE DETECTED NONE DETECTED    Comment:        DRUG SCREEN FOR MEDICAL PURPOSES ONLY.  IF CONFIRMATION IS NEEDED FOR ANY PURPOSE, NOTIFY LAB WITHIN 5 DAYS.        LOWEST DETECTABLE LIMITS FOR URINE DRUG SCREEN Drug Class       Cutoff (ng/mL) Amphetamine      1000 Barbiturate      200 Benzodiazepine   951 Tricyclics       884 Opiates          300 Cocaine          300 THC              50   Urinalysis, Routine w reflex microscopic     Status: None   Collection Time: 07/04/17  5:44 AM  Result Value Ref Range   Color, Urine YELLOW YELLOW   APPearance CLEAR CLEAR  Specific Gravity, Urine  1.014 1.005 - 1.030   pH 6.0 5.0 - 8.0   Glucose, UA NEGATIVE NEGATIVE mg/dL   Hgb urine dipstick NEGATIVE NEGATIVE   Bilirubin Urine NEGATIVE NEGATIVE   Ketones, ur NEGATIVE NEGATIVE mg/dL   Protein, ur NEGATIVE NEGATIVE mg/dL   Nitrite NEGATIVE NEGATIVE   Leukocytes, UA NEGATIVE NEGATIVE  POC urine preg, ED     Status: None   Collection Time: 07/04/17  5:54 AM  Result Value Ref Range   Preg Test, Ur NEGATIVE NEGATIVE    Comment:        THE SENSITIVITY OF THIS METHODOLOGY IS >24 mIU/mL   Wet prep, genital     Status: Abnormal   Collection Time: 07/04/17  6:20 AM  Result Value Ref Range   Yeast Wet Prep HPF POC NONE SEEN NONE SEEN   Trich, Wet Prep NONE SEEN NONE SEEN   Clue Cells Wet Prep HPF POC NONE SEEN NONE SEEN   WBC, Wet Prep HPF POC RARE (A) NONE SEEN   Sperm NONE SEEN     Current Facility-Administered Medications  Medication Dose Route Frequency Provider Last Rate Last Dose  . lamoTRIgine (LAMICTAL) tablet 25 mg  25 mg Oral Daily Kora Groom, MD   25 mg at 07/05/17 1223  . OLANZapine (ZYPREXA) tablet 5 mg  5 mg Oral QHS Corena Pilgrim, MD       Current Outpatient Prescriptions  Medication Sig Dispense Refill  . ALPRAZolam (XANAX) 0.5 MG tablet Take 0.5 mg by mouth 3 (three) times daily as needed for anxiety.      Musculoskeletal: Strength & Muscle Tone: within normal limits Gait & Station: normal Patient leans: N/A  Psychiatric Specialty Exam: Physical Exam  Nursing note and vitals reviewed. Constitutional: She is oriented to person, place, and time.  Neck: Normal range of motion.  Respiratory: Effort normal.  Musculoskeletal: Normal range of motion.  Neurological: She is alert and oriented to person, place, and time.  Psychiatric: Her mood appears anxious. Her speech is rapid and/or pressured. She is not agitated, not aggressive and not actively hallucinating. Thought content is not paranoid and not delusional. Cognition and memory are normal. She  expresses impulsivity. She expresses no homicidal and no suicidal ideation.    Review of Systems  Psychiatric/Behavioral: Positive for depression. Negative for hallucinations, memory loss, substance abuse (Past history of polysubstance abuse) and suicidal ideas. The patient is nervous/anxious. The patient does not have insomnia.   All other systems reviewed and are negative.   Blood pressure (!) 98/56, pulse 64, temperature 98.7 F (37.1 C), temperature source Oral, resp. rate 16, SpO2 100 %.There is no height or weight on file to calculate BMI.  General Appearance: Casual  Eye Contact:  Good  Speech:  Pressured  Volume:  Normal  Mood:  Anxious  Affect:  Appropriate  Thought Process:  Goal Directed  Orientation:  Full (Time, Place, and Person)  Thought Content:  Logical  Suicidal Thoughts:  No  Homicidal Thoughts:  No  Memory:  Immediate;   Good Recent;   Good Remote;   Good  Judgement:  Fair  Insight:  Fair  Psychomotor Activity:  Normal  Concentration:  Concentration: Good and Attention Span: Good  Recall:  Good  Fund of Knowledge:  Fair  Language:  Good  Akathisia:  No  Handed:  Right  AIMS (if indicated):     Assets:  Communication Skills Desire for Improvement Housing Social Support  ADL's:  Intact  Cognition:  WNL  Sleep:        Treatment Plan Summary: Daily contact with patient to assess and evaluate symptoms and progress in treatment and Plan Discharge home follow up with primary outpatient psychiatrist  Disposition: No evidence of imminent risk to self or others at present.   Patient does not meet criteria for psychiatric inpatient admission.  Rankin, Delphia Grates, NP 07/05/2017 12:47 PM  Patient seen face-to-face for psychiatric evaluation, chart reviewed and case discussed with the physician extender and developed treatment plan. Reviewed the information documented and agree with the treatment plan. Corena Pilgrim, MD

## 2017-07-05 NOTE — ED Notes (Signed)
Boyfriend visiting.  Patient is sobbing.  Patient states, "now my boyfriend has lost his job because he is up here taking care of me!"  Explained to patient that if she decides to be hospitalized, she should let us know because Dr. Loni Muse. Is not going to be here all day.  Patient again complained about how she could not make a decision in such a short amount of time.

## 2017-07-05 NOTE — ED Notes (Signed)
Patient is undecided if she wants to go to the hospital or be discharged.  Patient was extremely talkative with doctor and would not let him get a word in.  Patient is irritable and states, "that doctor is an asshole.  He expects me to make up my mind just like that.  I need to think about this.  When would I go?  In the next 30 minutes?"  Explained to patient that I don't have a time that she would be transferred.  Informed patient that she could let us know what she wants to do, as she originally stated that she wanted to be hospitalized.  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

## 2017-07-05 NOTE — ED Notes (Signed)
Patient has rapid, pressured speech.  Patient states she "has lots of stressors."  She states, "I'm always like this.  I'm not bipolar.  I brought myself in.  I've been on a lot of different medications."  Patient thought process appears organized.  She is talkative and fidgety at times.  She denies any thoughts of self harm.

## 2017-07-05 NOTE — [facility] Suicide Risk Assessment (Cosign Needed)
Suicide Risk Assessment  Discharge Assessment   [facility] Discharge Suicide Risk Assessment   Principal Problem: Bipolar 2 disorder, major depressive episode [facility]) Discharge Diagnoses:  Patient Active Problem List   Diagnosis Date Noted  . Bipolar 2 disorder, major depressive episode ([place]) [F31.81] 07/05/2017  . Mania ([place]) [F30.9]     Total Time spent with patient: 30 minutes  Musculoskeletal: Strength & Muscle Tone: within normal limits Gait & Station: normal Patient leans: N/A  Psychiatric Specialty Exam: General Appearance: Casual  Eye Contact:  Good  Speech:  Pressured  Volume:  Normal  Mood:  Anxious  Affect:  Appropriate  Thought Process:  Goal Directed  Orientation:  Full (Time, Place, and Person)  Thought Content:  Logical  Suicidal Thoughts:  No  Homicidal Thoughts:  No  Memory:  Immediate;   Good Recent;   Good Remote;   Good  Judgement:  Fair  Insight:  Fair  Psychomotor Activity:  Normal  Concentration:  Concentration: Good and Attention Span: Good  Recall:  Good  Fund of Knowledge:  Fair  Language:  Good  Akathisia:  No  Handed:  Right  AIMS (if indicated):     Assets:  Communication Skills Desire for Improvement Housing Social Support  ADL's:  Intact  Cognition:  WNL  Sleep:        Mental Status Per Nursing Assessment::   On Admission:     Demographic Factors:  Caucasian  Loss Factors: NA  Historical Factors: NA  Risk Reduction Factors:   Positive social support and Positive therapeutic relationship  Continued Clinical Symptoms:  Previous Psychiatric Diagnoses and Treatments  Cognitive Features That Contribute To Risk:  None    Suicide Risk:  Minimal: No identifiable suicidal ideation.  Patients presenting with no risk factors but with morbid ruminations; may be classified as minimal risk based on the severity of the depressive symptoms    Plan Of Care/Follow-up recommendations:  Activity:  As tolerated Diet:  Heart  healthy  Coretha Creswell, NP 07/05/2017, 1:39 PM

## 2017-07-05 NOTE — Progress Notes (Signed)
TTS faxed the pt to the following inpt facilities for admission:  [facility], [place], [place], [place], [place], [place], [place], [place]  Lind Covert, MSW, [employer] TTS Specialist [phone]

## 2017-07-05 NOTE — ED Notes (Signed)
Patient decided she wanted to be discharged home.  Patient's BF is lying across the bed snoring.  Informed MD and NP that patient requests discharge.  Also informed patient that BF will have to wake up because he cannot sleep in her bed.

## 2017-07-06 LAB — GC/CHLAMYDIA PROBE AMP (~~LOC~~) NOT AT ARMC
Chlamydia: NEGATIVE
Neisseria Gonorrhea: NEGATIVE

## 2017-07-06 LAB — HIV ANTIBODY (ROUTINE TESTING W REFLEX): HIV Screen 4th Generation wRfx: NONREACTIVE

## 2017-09-22 ENCOUNTER — Ambulatory Visit (HOSPITAL_COMMUNITY)
Admission: EM | Admit: 2017-09-22 | Discharge: 2017-09-22 | Disposition: A | Discharge status: Home / Self Care | Payer: Self-pay | Attending: Emergency Medicine | Admitting: Emergency Medicine

## 2017-09-22 ENCOUNTER — Encounter (HOSPITAL_COMMUNITY): Payer: Self-pay | Admitting: Emergency Medicine

## 2017-09-22 DIAGNOSIS — S86911A Strain of unspecified muscle(s) and tendon(s) at lower leg level, right leg, initial encounter: Secondary | ICD-10-CM

## 2017-09-22 DIAGNOSIS — B373 Candidiasis of vulva and vagina: Secondary | ICD-10-CM | POA: Insufficient documentation

## 2017-09-22 DIAGNOSIS — Z113 Encounter for screening for infections with a predominantly sexual mode of transmission: Secondary | ICD-10-CM

## 2017-09-22 DIAGNOSIS — N898 Other specified noninflammatory disorders of vagina: Secondary | ICD-10-CM

## 2017-09-22 DIAGNOSIS — B9689 Other specified bacterial agents as the cause of diseases classified elsewhere: Secondary | ICD-10-CM | POA: Insufficient documentation

## 2017-09-22 MED ORDER — FLUCONAZOLE 200 MG PO TABS: 200.0000 mg | ORAL_TABLET | Freq: Once | ORAL | 0 refills | Status: AC
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
## 2017-09-23 LAB — CERVICOVAGINAL ANCILLARY ONLY
BACTERIAL VAGINITIS: NEGATIVE
Candida vaginitis: POSITIVE — AB
Chlamydia: NEGATIVE
Neisseria Gonorrhea: NEGATIVE
TRICH (WINDOWPATH): NEGATIVE

## 2017-11-25 ENCOUNTER — Ambulatory Visit: Payer: Self-pay | Admitting: Emergency Medicine

## 2017-11-25 VITALS — BP 102/70 | HR 98 | Temp 98.3°F | Resp 20

## 2017-11-25 DIAGNOSIS — R112 Nausea with vomiting, unspecified: Secondary | ICD-10-CM

## 2017-11-25 DIAGNOSIS — J0101 Acute recurrent maxillary sinusitis: Secondary | ICD-10-CM

## 2017-11-25 MED ORDER — DOXYCYCLINE HYCLATE 100 MG PO TABS: 100.0000 mg | ORAL_TABLET | Freq: Two times a day (BID) | ORAL | 0 refills | Status: DC

## 2017-11-25 MED ORDER — ONDANSETRON HCL 4 MG PO TABS: 4.0000 mg | ORAL_TABLET | Freq: Three times a day (TID) | ORAL | 1 refills | Status: DC | PRN

## 2017-11-25 MED ORDER — FLUCONAZOLE 200 MG PO TABS: ORAL_TABLET | ORAL | 1 refills | Status: DC

## 2017-11-25 MED ORDER — PREDNISONE 50 MG PO TABS: ORAL_TABLET | ORAL | 0 refills | Status: DC

## 2017-11-25 NOTE — Progress Notes (Signed)
Sonya Prince is a 26 y.o. female who presents for evaluation of possible sinusitis.  Symptoms include congestion, facial pain, fever: low grade fevers, nausea with vomiting, non productive cough and diarrhea.  Onset of symptoms was 10 days ago, and has been gradually worsening since that time.  Treatment to date:  antibiotics, antihistamines, cough suppressants, decongestants and rest.  Was previously seen at [facility] for similar symptoms in October, reports she never got 100% better from that visit. High risk factors for influenza complications:  none.  The following portions of the patient's history were reviewed and updated as appropriate:  allergies, current medications and past medical history.  Review of Systems  Constitutional: Positive for chills and fever.  HENT: Positive for ear pain and sinus pain.   Respiratory: Positive for cough. Negative for shortness of breath and wheezing.   Cardiovascular: Negative for chest pain and palpitations.  Gastrointestinal: Positive for diarrhea, nausea and vomiting. Negative for constipation.  Genitourinary: Negative.   Musculoskeletal: Negative.   Skin: Negative.     Objective:   Vitals:   11/25/17 1827  BP: 102/70  Pulse: 98  Resp: 20  Temp: 98.3 F (36.8 C)  SpO2: 99%    Physical Exam  Constitutional: She is well-developed, well-nourished, and in no distress. No distress.  HENT:  Head: Normocephalic and atraumatic.  Right Ear: Tympanic membrane and external ear normal.  Left Ear: Tympanic membrane normal.  Nose: Right sinus exhibits maxillary sinus tenderness. Left sinus exhibits maxillary sinus tenderness.  Mouth/Throat: Uvula is midline and oropharynx is clear and moist. No oropharyngeal exudate.  Eyes: Conjunctivae are normal.  Neck: Normal range of motion. Neck supple.  Cardiovascular: Normal rate and normal heart sounds.  Pulmonary/Chest: Effort normal and breath sounds normal.  Abdominal: Soft.  Lymphadenopathy:    She  has no cervical adenopathy.  Neurological: She is alert.  Skin: Skin is warm and dry. She is not diaphoretic.  Nursing note and vitals reviewed.    Assessment:   1. Acute recurrent maxillary sinusitis   2. Non-intractable vomiting with nausea, unspecified vomiting type       Plan:   1. Acute recurrent maxillary sinusitis  - doxycycline (VIBRA-TABS) 100 MG tablet; Take 1 tablet (100 mg total) by mouth 2 (two) times daily.  Dispense: 20 tablet; Refill: 0 - predniSONE (DELTASONE) 50 MG tablet; Take 1 tablet daily with food  Dispense: 5 tablet; Refill: 0 -Provided Rx for Diflucan due to potential for yeast vaginitis, provided counseling on its use and indication.  2. Non-intractable vomiting with nausea, unspecified vomiting type  - ondansetron (ZOFRAN) 4 MG tablet; Take 1 tablet (4 mg total) by mouth every 8 (eight) hours as needed for nausea or vomiting.  Dispense: 30 tablet; Refill: 1

## 2017-11-25 NOTE — Patient Instructions (Addendum)
Nausea, Adult Feeling sick to your stomach (nausea) means that your stomach is upset or you feel like you have to throw up (vomit). Feeling sick to your stomach is usually not serious, but it may be an early sign of a more serious medical problem. As you feel sicker to your stomach, it can lead to throwing up (vomiting). If you throw up, or if you are not able to drink enough fluids, there is a risk of dehydration. Dehydration can make you feel tired and thirsty, have a dry mouth, and pee (urinate) less often. Older adults and people who have other diseases or a weak defense (immune) system have a higher risk of dehydration. The main goal of treating this condition is to:  Limit how often you feel sick to your stomach.  Prevent throwing up and dehydration.  Follow these instructions at home: Follow instructions from your doctor about how to care for yourself at home. Eating and drinking Follow these recommendations as told by your doctor:  Take an oral rehydration solution (ORS). This is a drink that is sold at pharmacies and stores.  Drink clear fluids in small amounts as you are able, such as: ? Water. ? Ice chips. ? Fruit juice that has water added (diluted fruit juice). ? Low-calorie sports drinks.  Eat bland, easy to digest foods in small amounts as you are able, such as: ? Bananas. ? Applesauce. ? Rice. ? Lean meats. ? Toast. ? Crackers.  Avoid drinking fluids that contain a lot of sugar or caffeine.  Avoid alcohol.  Avoid spicy or fatty foods.  General instructions  Drink enough fluid to keep your pee (urine) clear or pale yellow.  Wash your hands often. If you cannot use soap and water, use hand sanitizer.  Make sure that all people in your household wash their hands well and often.  Rest at home while you get better.  Take over-the-counter and prescription medicines only as told by your doctor.  Breathe slowly and deeply when you feel sick to your  stomach.  Watch your condition for any changes.  Keep all follow-up visits as told by your doctor. This is important. Contact a doctor if:  You have a headache.  You have new symptoms.  You feel sicker to your stomach.  You have a fever.  You feel light-headed or dizzy.  You throw up.  You are not able to keep fluids down. Get help right away if:  You have pain in your chest, neck, arm, or jaw.  You feel very weak or you pass out (faint).  You have throw up that is bright red or looks like coffee grounds.  You have bloody or black poop (stools), or poop that looks like tar.  You have a very bad headache, a stiff neck, or both.  You have very bad pain, cramping, or bloating in your belly.  You have a rash.  You have trouble breathing or you are breathing very quickly.  Your heart is beating very quickly.  Your skin feels cold and clammy.  You feel confused.  You have pain while peeing.  You have signs of dehydration, such as: ? Dark pee, or very little or no pee. ? Cracked lips. ? Dry mouth. ? Sunken eyes. ? Sleepiness. ? Weakness. These symptoms may be an emergency. Do not wait to see if the symptoms will go away. Get medical help right away. Call your local emergency services (911 in the [place] Do not drive yourself to   the hospital. This information is not intended to replace advice given to you by your health care provider. Make sure you discuss any questions you have with your health care provider. Document Released: 11/06/2011 Document Revised: 04/24/2016 Document Reviewed: 07/24/2015 Elsevier Interactive Patient Education  2018 [employer]. Sinusitis, Adult Sinusitis is soreness and inflammation of your sinuses. Sinuses are hollow spaces in the bones around your face. They are located:  Around your eyes.  In the middle of your forehead.  Behind your nose.  In your cheekbones.  Your sinuses and nasal passages are lined with a stringy fluid  (mucus). Mucus normally drains out of your sinuses. When your nasal tissues get inflamed or swollen, the mucus can get trapped or blocked so air cannot flow through your sinuses. This lets bacteria, viruses, and funguses grow, and that leads to infection. Follow these instructions at home: Medicines  Take, use, or apply over-the-counter and prescription medicines only as told by your doctor. These may include nasal sprays.  If you were prescribed an antibiotic medicine, take it as told by your doctor. Do not stop taking the antibiotic even if you start to feel better. Hydrate and Humidify  Drink enough water to keep your pee (urine) clear or pale yellow.  Use a cool mist humidifier to keep the humidity level in your home above 50%.  Breathe in steam for 10-15 minutes, 3-4 times a day or as told by your doctor. You can do this in the bathroom while a hot shower is running.  Try not to spend time in cool or dry air. Rest  Rest as much as possible.  Sleep with your head raised (elevated).  Make sure to get enough sleep each night. General instructions  Put a warm, moist washcloth on your face 3-4 times a day or as told by your doctor. This will help with discomfort.  Wash your hands often with soap and water. If there is no soap and water, use hand sanitizer.  Do not smoke. Avoid being around people who are smoking (secondhand smoke).  Keep all follow-up visits as told by your doctor. This is important. Contact a doctor if:  You have a fever.  Your symptoms get worse.  Your symptoms do not get better within 10 days. Get help right away if:  You have a very bad headache.  You cannot stop throwing up (vomiting).  You have pain or swelling around your face or eyes.  You have trouble seeing.  You feel confused.  Your neck is stiff.  You have trouble breathing. This information is not intended to replace advice given to you by your health care provider. Make sure you  discuss any questions you have with your health care provider. Document Released: 05/05/2008 Document Revised: 07/13/2016 Document Reviewed: 09/12/2015 Elsevier Interactive Patient Education  [employer].

## 2017-11-28 ENCOUNTER — Telehealth: Payer: Self-pay

## 2017-11-28 NOTE — Telephone Encounter (Signed)
I spoke to the patient on the phone and she  said, shes going to call back later.

## 2018-02-03 ENCOUNTER — Other Ambulatory Visit: Payer: Self-pay

## 2018-02-03 ENCOUNTER — Emergency Department (HOSPITAL_COMMUNITY): Admission: EM | Admit: 2018-02-03 | Discharge: 2018-02-03 | Discharge status: Against Medical Advice | Payer: Self-pay

## 2018-02-03 NOTE — ED Notes (Signed)
No answer in the waiting room

## 2018-02-03 NOTE — ED Notes (Signed)
Pt's name called for triage no answer 

## 2018-02-04 ENCOUNTER — Encounter (HOSPITAL_COMMUNITY): Payer: Self-pay

## 2018-02-04 ENCOUNTER — Other Ambulatory Visit: Payer: Self-pay

## 2018-02-04 ENCOUNTER — Emergency Department (HOSPITAL_COMMUNITY)
Admission: EM | Admit: 2018-02-04 | Discharge: 2018-02-04 | Disposition: A | Discharge status: Home / Self Care | Payer: Self-pay | Attending: Physician Assistant | Admitting: Physician Assistant

## 2018-02-04 DIAGNOSIS — Z79899 Other long term (current) drug therapy: Secondary | ICD-10-CM | POA: Insufficient documentation

## 2018-02-04 DIAGNOSIS — R112 Nausea with vomiting, unspecified: Secondary | ICD-10-CM | POA: Insufficient documentation

## 2018-02-04 DIAGNOSIS — R1013 Epigastric pain: Secondary | ICD-10-CM | POA: Insufficient documentation

## 2018-02-04 LAB — CBC
HCT: 43.8 % (ref 36.0–46.0)
HEMOGLOBIN: 14.7 g/dL (ref 12.0–15.0)
MCH: 32.5 pg (ref 26.0–34.0)
MCHC: 33.6 g/dL (ref 30.0–36.0)
MCV: 96.7 fL (ref 78.0–100.0)
PLATELETS: 239 10*3/uL (ref 150–400)
RBC: 4.53 MIL/uL (ref 3.87–5.11)
RDW: 13 % (ref 11.5–15.5)
WBC: 10 10*3/uL (ref 4.0–10.5)

## 2018-02-04 LAB — COMPREHENSIVE METABOLIC PANEL
ALT: 24 U/L (ref 14–54)
AST: 28 U/L (ref 15–41)
Albumin: 4.3 g/dL (ref 3.5–5.0)
Alkaline Phosphatase: 49 U/L (ref 38–126)
Anion gap: 10 (ref 5–15)
BUN: 8 mg/dL (ref 6–20)
CHLORIDE: 104 mmol/L (ref 101–111)
CO2: 25 mmol/L (ref 22–32)
CREATININE: 0.63 mg/dL (ref 0.44–1.00)
Calcium: 9.6 mg/dL (ref 8.9–10.3)
GFR calc Af Amer: >60 mL/min (ref >60)
GFR calc non Af Amer: >60 mL/min (ref >60)
Glucose, Bld: 78 mg/dL (ref 65–99)
Potassium: 3.9 mmol/L (ref 3.5–5.1)
SODIUM: 139 mmol/L (ref 135–145)
Total Bilirubin: 0.7 mg/dL (ref 0.3–1.2)
Total Protein: 7.1 g/dL (ref 6.5–8.1)

## 2018-02-04 LAB — URINALYSIS, ROUTINE W REFLEX MICROSCOPIC
Bilirubin Urine: NEGATIVE
GLUCOSE, UA: NEGATIVE mg/dL
HGB URINE DIPSTICK: NEGATIVE
Ketones, ur: NEGATIVE mg/dL
Leukocytes, UA: NEGATIVE
Nitrite: NEGATIVE
Protein, ur: NEGATIVE mg/dL
SPECIFIC GRAVITY, URINE: 1.01 (ref 1.005–1.030)
pH: 8 (ref 5.0–8.0)

## 2018-02-04 LAB — I-STAT BETA HCG BLOOD, ED (MC, WL, AP ONLY): I-stat hCG, quantitative: <5 m[IU]/mL (ref <5)

## 2018-02-04 LAB — LIPASE, BLOOD: LIPASE: 26 U/L (ref 11–51)

## 2018-02-04 NOTE — ED Notes (Signed)
Patient left prior to PA completion of exam/prior to being discharged by EDP and receiving discharge papers.

## 2018-02-04 NOTE — ED Provider Notes (Signed)
[facility] EMERGENCY DEPARTMENT Provider Note   CSN: [id number] Arrival date & time: 02/04/18  1406     History   Chief Complaint Chief Complaint  Patient presents with  . Emesis    HPI Sonya Prince is a 27 y.o. female.  HPI   Sonya Prince is a 27yo female with a history of bipolar disorder who presents to the emergency department for evaluation of nausea and vomiting for the past two months. She states that she intermittently has epigastric abdominal pain which feels "like acid." She has associated nausea and occasionally vomits. States that she vomits about twice a week with the nausea. Last episode of vomiting was yesterday. She also states that she has regular daily bowel movements which seem softer than usual. She tried taking tums and has had some relief. She denies nausea/vomiting at this time and is drinking a cup of water. Currently she denies abdominal pain, dysuria, urinary frequency, vaginal discharge, fever/chills, chest pain, shortness of breath. States that she does not have a PCP due to cost issues. She denies previous abdominal surgeries. LMP a week ago and normal. States "are you going to tell me I'm okay because I'm ready to leave."   Past Medical History:  Diagnosis Date  . Anxiety   . Meningitis   . PTSD (post-traumatic stress disorder)     Patient Active Problem List   Diagnosis Date Noted  . Bipolar 2 disorder, major depressive episode ([place]) 07/05/2017  . [facility])     Past Surgical History:  Procedure Laterality Date  . WISDOM TOOTH EXTRACTION      OB History    No data available       Home Medications    Prior to Admission medications   Medication Sig Start Date End Date Taking? Authorizing Provider  ALPRAZolam Duanne Moron) 0.5 MG tablet Take 0.5 mg by mouth 3 (three) times daily as needed for anxiety.    [provider]  doxycycline (VIBRA-TABS) 100 MG tablet Take 1 tablet (100 mg total) by mouth 2 (two) times  daily. 11/25/17   Barnet Glasgow, NP  fluconazole (DIFLUCAN) 200 MG tablet Take 1 tablet now, than wait 3 days and take the second tablet. 11/25/17   Barnet Glasgow, NP  ondansetron (ZOFRAN) 4 MG tablet Take 1 tablet (4 mg total) by mouth every 8 (eight) hours as needed for nausea or vomiting. 11/25/17   Barnet Glasgow, NP  predniSONE (DELTASONE) 50 MG tablet Take 1 tablet daily with food 11/25/17   Barnet Glasgow, NP    Family History History reviewed. No pertinent family history.  Social History Social History   Tobacco Use  . Smoking status: Never Smoker  . Smokeless tobacco: Never Used  Substance Use Topics  . Alcohol use: Yes    Comment: occassionally  . Drug use: Yes    Types: Marijuana     Allergies   Doxycycline   Review of Systems Review of Systems  Constitutional: Negative for chills and fever.  Eyes: Negative for visual disturbance.  Respiratory: Negative for shortness of breath.   Cardiovascular: Negative for chest pain.  Gastrointestinal: Positive for abdominal pain (epigastric discomfort at times), nausea (intermittently, last reported yesterday) and vomiting (intermittently, last reported yesterday).  Genitourinary: Negative for difficulty urinating, dysuria, flank pain, menstrual problem, vaginal bleeding and vaginal discharge.  Musculoskeletal: Negative for gait problem.  Skin: Negative for rash.  Neurological: Negative for headaches.  Psychiatric/Behavioral: Negative for agitation.     Physical Exam Updated  Vital Signs BP 112/75 (BP Location: Right Arm)   Pulse 98   Temp 98.6 F (37 C) (Oral)   Resp 14   Ht 5' 2.5" (1.588 m)   LMP 01/28/2018 (Within Days)   SpO2 100%   BMI 18.00 kg/m   Physical Exam  Constitutional: She is oriented to person, place, and time. She appears well-developed and well-nourished. No distress.  HENT:  Head: Normocephalic and atraumatic.  Mouth/Throat: Oropharynx is clear and moist. No oropharyngeal  exudate.  Mucous membranes moist  Eyes: Conjunctivae are normal. Pupils are equal, round, and reactive to light. Right eye exhibits no discharge. Left eye exhibits no discharge.  Neck: Normal range of motion. Neck supple.  Cardiovascular: Normal rate, regular rhythm and intact distal pulses. Exam reveals no friction rub.  No murmur heard. Pulmonary/Chest: Effort normal and breath sounds normal. No stridor. No respiratory distress. She has no wheezes. She has no rales.  Abdominal: Soft. Bowel sounds are normal. There is no tenderness. There is no guarding.  Abdomen soft and non-tender  Musculoskeletal: Normal range of motion.  Neurological: She is alert and oriented to person, place, and time. Coordination normal.  Skin: Skin is warm and dry. She is not diaphoretic.  Psychiatric: She has a normal mood and affect. Her behavior is normal.  Nursing note and vitals reviewed.    ED Treatments / Results  Labs (all labs ordered are listed, but only abnormal results are displayed) Labs Reviewed  URINALYSIS, ROUTINE W REFLEX MICROSCOPIC - Abnormal; Notable for the following components:      Result Value   Color, Urine STRAW (*)    All other components within normal limits  LIPASE, BLOOD  COMPREHENSIVE METABOLIC PANEL  CBC  I-STAT BETA HCG BLOOD, ED ([facility], [facility], [facility] ONLY)    EKG  EKG Interpretation None       Radiology No results found.  Procedures Procedures (including critical care time)  Medications Ordered in ED Medications - No data to display   Initial Impression / Assessment and Plan / ED Course  I have reviewed the triage vital signs and the nursing notes.  Pertinent labs & imaging results that were available during my care of the patient were reviewed by me and considered in my medical decision making (see chart for details).     Presents with intermittent vomiting for the past two months associated with epigastric discomfort. Has tried TUMs and has had some relief.  Patient denies symptoms today. Is drinking a cup of water as we speak.   On exam patient afebrile, non-toxic and in NAD. Abdomen non-tender. Results reviewed. CMP, CBC and Lipase WNL. BhCG negative. UA WNL. Suspect her symptoms are related to gastritis given location of pain and improved with TUMs.   No concern for acute appendicitis, cholecystitis, pancreatitis, perforated viscus, bowel obstruction, diverticulitis, nephrolithiasis, ectopic pregnancy, ovarian torsion given exam and lab results.   Began to discuss results with patient who states "I'd like to just leave if all the blood work is fine." Have counseled patient that she can use Zantac for further acid control. She declines information to find PCP. She walks out of the department prior to receiving her discharge paperwork.   Final Clinical Impressions(s) / ED Diagnoses   Final diagnoses:  Non-intractable vomiting with nausea, unspecified vomiting type    ED Discharge Orders    None       Glyn Ade, PA-C 02/04/18 1732    Macarthur Critchley, MD 02/04/18 2354

## 2018-02-04 NOTE — ED Notes (Signed)
PA at bedside at this time.  

## 2018-02-04 NOTE — ED Notes (Signed)
Lab called and reports blood tubes spilled in bag when tubed. Need to be recollected.

## 2018-02-04 NOTE — ED Triage Notes (Signed)
Pt states she has been vomiting in the morning X1 month, reports it is like acid. LMP: last week. Pt also reports some intermittent diarrhea. Pt states she feels as though she could have a UTI.

## 2018-02-04 NOTE — Discharge Instructions (Signed)
Return to the ER if you have fever, vomiting that will not stop or have abdominal pain.

## 2018-08-30 ENCOUNTER — Other Ambulatory Visit: Payer: Self-pay

## 2018-08-30 ENCOUNTER — Encounter (HOSPITAL_COMMUNITY): Payer: Self-pay | Admitting: Emergency Medicine

## 2018-08-30 ENCOUNTER — Emergency Department (HOSPITAL_COMMUNITY)
Admission: EM | Admit: 2018-08-30 | Discharge: 2018-08-31 | Disposition: A | Discharge status: Home / Self Care | Payer: Self-pay | Attending: Emergency Medicine | Admitting: Emergency Medicine

## 2018-08-30 DIAGNOSIS — S01512A Laceration without foreign body of oral cavity, initial encounter: Secondary | ICD-10-CM | POA: Insufficient documentation

## 2018-08-30 DIAGNOSIS — F209 Schizophrenia, unspecified: Secondary | ICD-10-CM | POA: Insufficient documentation

## 2018-08-30 DIAGNOSIS — Y939 Activity, unspecified: Secondary | ICD-10-CM | POA: Insufficient documentation

## 2018-08-30 DIAGNOSIS — R569 Unspecified convulsions: Secondary | ICD-10-CM | POA: Insufficient documentation

## 2018-08-30 DIAGNOSIS — Y999 Unspecified external cause status: Secondary | ICD-10-CM | POA: Insufficient documentation

## 2018-08-30 DIAGNOSIS — F3289 Other specified depressive episodes: Secondary | ICD-10-CM | POA: Insufficient documentation

## 2018-08-30 DIAGNOSIS — Y929 Unspecified place or not applicable: Secondary | ICD-10-CM | POA: Insufficient documentation

## 2018-08-30 DIAGNOSIS — X58XXXA Exposure to other specified factors, initial encounter: Secondary | ICD-10-CM | POA: Insufficient documentation

## 2018-08-30 LAB — CBC
HCT: 41.6 % (ref 36.0–46.0)
Hemoglobin: 13.7 g/dL (ref 12.0–15.0)
MCH: 32.3 pg (ref 26.0–34.0)
MCHC: 32.9 g/dL (ref 30.0–36.0)
MCV: 98.1 fL (ref 78.0–100.0)
PLATELETS: 196 10*3/uL (ref 150–400)
RBC: 4.24 MIL/uL (ref 3.87–5.11)
RDW: 11.6 % (ref 11.5–15.5)
WBC: 13.6 10*3/uL — AB (ref 4.0–10.5)

## 2018-08-30 LAB — I-STAT BETA HCG BLOOD, ED (MC, WL, AP ONLY): I-stat hCG, quantitative: <5 m[IU]/mL (ref <5)

## 2018-08-30 NOTE — ED Triage Notes (Signed)
Per [facility], Pt was at work when she started to stare off and said, "I need help." Pt then started to fall back, she was caught by her coworkers. Pt started convulsing for approximately 1 minute. When EMS arrived pt had another seizure as she was being put onto the stretcher. EMS reports it lasted for 30-40 seconds. Pt has been post-ictal since. Pt confused, disoriented to time and situation. Pt has oral trauma. Pt has no history of seizures in past.

## 2018-08-30 NOTE — ED Provider Notes (Signed)
[facility] EMERGENCY DEPARTMENT Provider Note  CSN: [id number] Arrival date & time: 08/30/18 2233  Chief Complaint(s) Seizures  Triage Note 09/30 2233 Per [facility], Pt was at work when she started to stare off and said, "I need help." Pt then started to fall back, she was caught by her coworkers. Pt started convulsing for approximately 1 minute. When EMS arrived pt had another seizure as she was being put onto the stretcher. EMS reports it lasted for 30-40 seconds. Pt has been post-ictal since. Pt confused, disoriented to time and situation. Pt has oral trauma. Pt has no history of seizures in past.    HPI Sonya Prince is a 27 y.o. female who presents after seizure like episode.  Patient has no recollection of the episodes.  Currently she is alert and oriented x3.  She denies any recent fevers or infections.  No recent runny nose, cough, congestion.  No nausea or vomiting.  No chest pain or shortness of breath.  No abdominal pain.  She does endorse loose bowel movements for several weeks.  Endorses a history of anxiety and a prescription for Xanax but states that she tries to stay away from taking Xanax.  States that she thinks she last took on several weeks ago.  She did report that over the past several weeks she has had increased stress after losing her job, being kicked out of her apartment, having to stay with an acquaintance after being in turn away by family.  She did report that she has been drinking more alcohol, 3 times a week which is much more than she typically drinks.  Endorses infrequent marijuana use but denies any other illicit drug use.  States that her life is "shitty."  She reports that she previously thought about suicide but states that she has "moved past that."  Denies any current SI.  Denies any HI.  Denies any AVH.  HPI  Past Medical History Past Medical History:  Diagnosis Date  . Anxiety   . Meningitis   . PTSD (post-traumatic stress disorder)     Patient Active Problem List   Diagnosis Date Noted  . Bipolar 2 disorder, major depressive episode ([place]) 07/05/2017  . Mania ([place])    Home Medication(s) Prior to Admission medications   Medication Sig Start Date End Date Taking? Authorizing Provider  ALPRAZolam Duanne Moron) 0.5 MG tablet Take 0.5 mg by mouth 3 (three) times daily as needed for anxiety.    [provider]  doxycycline (VIBRA-TABS) 100 MG tablet Take 1 tablet (100 mg total) by mouth 2 (two) times daily. Patient not taking: Reported on 08/31/2018 11/25/17   Barnet Glasgow, NP  fluconazole (DIFLUCAN) 200 MG tablet Take 1 tablet now, than wait 3 days and take the second tablet. Patient not taking: Reported on 08/31/2018 11/25/17   Barnet Glasgow, NP  ondansetron (ZOFRAN) 4 MG tablet Take 1 tablet (4 mg total) by mouth every 8 (eight) hours as needed for nausea or vomiting. Patient not taking: Reported on 08/31/2018 11/25/17   Barnet Glasgow, NP  predniSONE (DELTASONE) 50 MG tablet Take 1 tablet daily with food Patient not taking: Reported on 08/31/2018 11/25/17   Barnet Glasgow, NP  Past Surgical History Past Surgical History:  Procedure Laterality Date  . WISDOM TOOTH EXTRACTION     Family History History reviewed. No pertinent family history.  Social History Social History   Tobacco Use  . Smoking status: Never Smoker  . Smokeless tobacco: Never Used  Substance Use Topics  . Alcohol use: Yes    Comment: occassionally  . Drug use: Yes    Types: Marijuana   Allergies Doxycycline  Review of Systems Review of Systems All other systems are reviewed and are negative for acute change except as noted in the HPI  Physical Exam Vital Signs  I have reviewed the triage vital signs BP 136/84   Pulse (!) 108   Temp 98.2 F (36.8 C) (Oral)   Resp 20   Ht 5' 2.5" (1.588  m)   Wt 47.6 kg   LMP 08/18/2018   SpO2 100%   BMI 18.90 kg/m  All other systems are reviewed and are negative for acute change except as noted in the HPI  Physical Exam  Constitutional: She is oriented to person, place, and time. She appears well-developed and well-nourished. No distress.  HENT:  Head: Normocephalic and atraumatic.  Right Ear: External ear normal.  Left Ear: External ear normal.  Nose: Nose normal.  Mouth/Throat:    Eyes: Conjunctivae and EOM are normal. No scleral icterus.  Neck: Normal range of motion and phonation normal.  Cardiovascular: Normal rate and regular rhythm.  Pulmonary/Chest: Effort normal. No stridor. No respiratory distress.  Abdominal: She exhibits no distension.  Musculoskeletal: Normal range of motion. She exhibits no edema.  Neurological: She is alert and oriented to person, place, and time.  Mental Status:  Alert and oriented to person, place, and time.  Attention and concentration normal.  Speech clear.  Recent memory is intact  Cranial Nerves:  II Visual Fields: Intact to confrontation. Visual fields intact. III, IV, VI: Pupils equal and reactive to light and near. Full eye movement without nystagmus  V Facial Sensation: Normal. No weakness of masticatory muscles  VII: No facial weakness or asymmetry  VIII Auditory Acuity: Grossly normal  IX/X: The uvula is midline; the palate elevates symmetrically  XI: Normal sternocleidomastoid and trapezius strength  XII: The tongue is midline. No atrophy or fasciculations.   Motor System: Muscle Strength: 5/5 and symmetric in the upper and lower extremities. No pronation or drift.  Muscle Tone: Tone and muscle bulk are normal in the upper and lower extremities.   Reflexes: DTRs: 1+ and symmetrical in all four extremities. No Clonus Coordination: Intact finger-to-nose. No tremor.  Sensation: Intact to light touch.  Gait: deferred.   Skin: She is not diaphoretic.  Psychiatric: She has a  normal mood and affect. Her behavior is normal.  Vitals reviewed.   ED Results and Treatments Labs (all labs ordered are listed, but only abnormal results are displayed) Labs Reviewed  BASIC METABOLIC PANEL - Abnormal; Notable for the following components:      Result Value   Potassium 3.4 (*)    All other components within normal limits  CBC - Abnormal; Notable for the following components:   WBC 13.6 (*)    All other components within normal limits  RAPID URINE DRUG SCREEN, HOSP PERFORMED - Abnormal; Notable for the following components:   Benzodiazepines POSITIVE (*)    Tetrahydrocannabinol POSITIVE (*)    All other components within normal limits  URINALYSIS, ROUTINE W REFLEX MICROSCOPIC - Abnormal; Notable for the following components:   APPearance  HAZY (*)    All other components within normal limits  CBG MONITORING, ED - Abnormal; Notable for the following components:   Glucose-Capillary 100 (*)    All other components within normal limits  HEPATIC FUNCTION PANEL  ETHANOL  I-STAT BETA HCG BLOOD, ED ([facility], [facility], [facility] ONLY)                                                                                                                         EKG  EKG Interpretation  Date/Time:  Monday August 30 2018 22:35:09 EDT Ventricular Rate:  110 PR Interval:    QRS Duration: 73 QT Interval:  330 QTC Calculation: 447 R Axis:   85 Text Interpretation:  Sinus tachycardia Probable left atrial enlargement Baseline wander in lead(s) V5 No old tracing to compare Reconfirmed by Addison Lank [phone]) on 08/31/2018 12:48:45 AM      Radiology Ct Head Wo Contrast  Result Date: 08/31/2018 CLINICAL DATA:  Seizure, new, nontraumatic, 18-40 yrs EXAM: CT HEAD WITHOUT CONTRAST TECHNIQUE: Contiguous axial images were obtained from the base of the skull through the vertex without intravenous contrast. COMPARISON:  Head CT 10/10/2016 FINDINGS: Brain: Left tentorial calcification as well as coarse  calcification in the adjacent occipital lobe, unchanged from prior exam. No associated edema or mass effect. No intracranial hemorrhage, mass effect, or midline shift. No hydrocephalus. The basilar cisterns are patent. No evidence of territorial infarct or acute ischemia. No extra-axial or intracranial fluid collection. Vascular: No hyperdense vessel. Skull: No fracture or focal lesion. Sinuses/Orbits: Paranasal sinuses and mastoid air cells are clear. Slight dysconjugate gaze, typically incidental. Other: None. IMPRESSION: 1.  No acute intracranial abnormality. 2. Small left occipital calcification and adjacent tentorial calcification are likely dystrophic, unchanged from prior exam. Electronically Signed   By: Keith Rake M.D.   On: 08/31/2018 00:29   Pertinent labs & imaging results that were available during my care of the patient were reviewed by me and considered in my medical decision making (see chart for details).  Medications Ordered in ED Medications  chlorhexidine (PERIDEX) 0.12 % solution 15 mL (has no administration in time range)  acetaminophen (TYLENOL) tablet 650 mg (has no administration in time range)  lidocaine (XYLOCAINE) 2 % viscous mouth solution 15 mL (15 mLs Mouth/Throat Given 08/31/18 0355)  lidocaine-EPINEPHrine (XYLOCAINE W/EPI) 2 %-1:200000 (PF) injection 10 mL (10 mLs Intradermal Given 08/31/18 0541)  chlorhexidine (PERIDEX) 0.12 % solution 15 mL (15 mLs Mouth/Throat Given 08/31/18 0539)  Procedures .Marland KitchenLaceration Repair Date/Time: 08/31/2018 7:36 AM Performed by: Fatima Blank, MD Authorized by: Fatima Blank, MD   Consent:    Consent obtained:  Verbal   Consent given by:  Patient   Risks discussed:  Poor cosmetic result and poor wound healing   Alternatives discussed:  Delayed treatment Anesthesia (see MAR for exact  dosages):    Anesthesia method:  Topical application Laceration details:    Location:  Mouth   Mouth location:  Tongue, anterior 2/3   Length (cm):  2   Depth (mm):  4 Repair type:    Repair type:  Simple Exploration:    Wound extent: no foreign bodies/material noted and no vascular damage noted     Contaminated: no   Treatment:    Wound cleansed with: Chlorhexidine mouthwash. Skin repair:    Repair method:  Sutures   Suture size:  3-0   Wound skin closure material used: VICRYL.   Suture technique:  Simple interrupted   Number of sutures:  2 Approximation:    Approximation:  Close Post-procedure details:    Patient tolerance of procedure:  Tolerated well, no immediate complications    (including critical care time)  Medical Decision Making / ED Course I have reviewed the nursing notes for this encounter and the patient's prior records (if available in EHR or on provided paperwork).    New onset seizure.  Patient is afebrile with stable vital signs.  Alert and oriented x3.  No focal deficits on exam.  EKG without dysrhythmias or prolonged QT.  Will obtain screening labs and imaging.  Tongue laceration will require primary closure.  Closed as above.  Patient will require prescription for Peridex mouthwash.   Screening labs without any significant electrolyte derangements or renal insufficiency.  UA without evidence of infection.  UDS positive for benzodiazepines and THC.  CT had unremarkable.  No infectious symptoms concerning for meningitis.  No additional seizure episodes.  Given her reported recent SI and depression, TTS was consulted who recommended inpatient management.  Final Clinical Impression(s) / ED Diagnoses Final diagnoses:  Seizure ([place])  Other depression  Laceration of tongue, initial encounter      This chart was dictated using voice recognition software.  Despite best efforts to proofread,  errors can occur which can change the documentation  meaning.   Fatima Blank, MD 08/31/18 [phone]

## 2018-08-31 ENCOUNTER — Emergency Department (HOSPITAL_COMMUNITY): Payer: Self-pay

## 2018-08-31 LAB — RAPID URINE DRUG SCREEN, HOSP PERFORMED
Amphetamines: NOT DETECTED
BARBITURATES: NOT DETECTED
Benzodiazepines: POSITIVE — AB
Cocaine: NOT DETECTED
Opiates: NOT DETECTED
TETRAHYDROCANNABINOL: POSITIVE — AB

## 2018-08-31 LAB — BASIC METABOLIC PANEL
Anion gap: 7 (ref 5–15)
BUN: 11 mg/dL (ref 6–20)
CO2: 26 mmol/L (ref 22–32)
Calcium: 9.1 mg/dL (ref 8.9–10.3)
Chloride: 105 mmol/L (ref 98–111)
Creatinine, Ser: 0.74 mg/dL (ref 0.44–1.00)
GFR calc Af Amer: >60 mL/min (ref >60)
GFR calc non Af Amer: >60 mL/min (ref >60)
Glucose, Bld: 97 mg/dL (ref 70–99)
Potassium: 3.4 mmol/L — ABNORMAL LOW (ref 3.5–5.1)
Sodium: 138 mmol/L (ref 135–145)

## 2018-08-31 LAB — HEPATIC FUNCTION PANEL
ALBUMIN: 4 g/dL (ref 3.5–5.0)
ALK PHOS: 46 U/L (ref 38–126)
ALT: 17 U/L (ref 0–44)
AST: 19 U/L (ref 15–41)
BILIRUBIN TOTAL: 0.4 mg/dL (ref 0.3–1.2)
Bilirubin, Direct: 0.1 mg/dL (ref 0.0–0.2)
Indirect Bilirubin: 0.3 mg/dL (ref 0.3–0.9)
Total Protein: 6.6 g/dL (ref 6.5–8.1)

## 2018-08-31 LAB — URINALYSIS, ROUTINE W REFLEX MICROSCOPIC
BILIRUBIN URINE: NEGATIVE
Glucose, UA: NEGATIVE mg/dL
Hgb urine dipstick: NEGATIVE
KETONES UR: NEGATIVE mg/dL
LEUKOCYTES UA: NEGATIVE
NITRITE: NEGATIVE
PROTEIN: NEGATIVE mg/dL
Specific Gravity, Urine: 1.017 (ref 1.005–1.030)
pH: 6 (ref 5.0–8.0)

## 2018-08-31 LAB — ETHANOL: Alcohol, Ethyl (B): <10 mg/dL (ref <10)

## 2018-08-31 LAB — CBG MONITORING, ED: Glucose-Capillary: 100 mg/dL — ABNORMAL HIGH (ref 70–99)

## 2018-08-31 IMAGING — CT CT HEAD W/O CM
4 series · 16 of 47 positions shown, 18 images · non-contrast
Comparison: Head CT [DATE]

CLINICAL DATA: Seizure, new, nontraumatic, 18-40 yrs

EXAM:
CT HEAD WITHOUT CONTRAST
TECHNIQUE: Contiguous axial images were obtained from the base of the skull
through the vertex without intravenous contrast.

[Series 3: head without · axial · non-contrast · 0.42mm/px · z∈[-78,+42]mm · 7 of 33 slices shown, 9 images]
[im 5/33  brain]
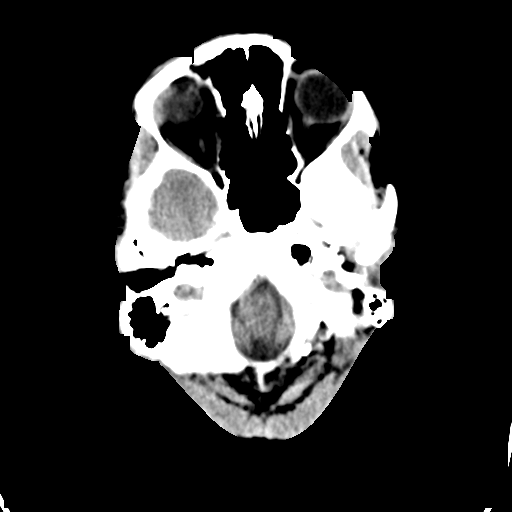
[im 5/33  bone]
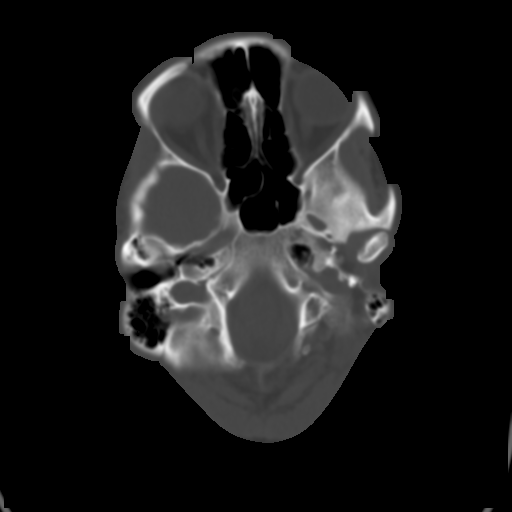
[im 9/33  brain]
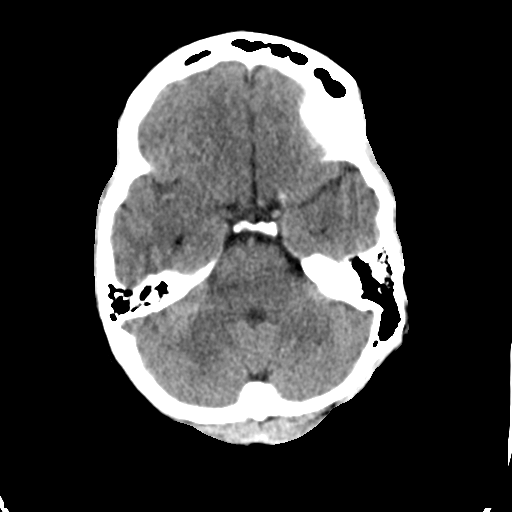
[im 13/33  brain]
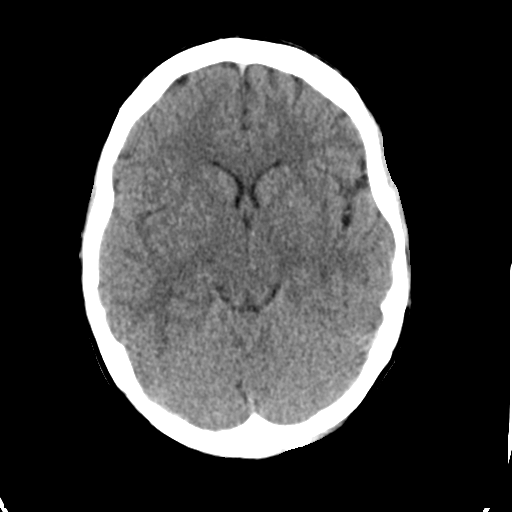
[im 17/33  brain]
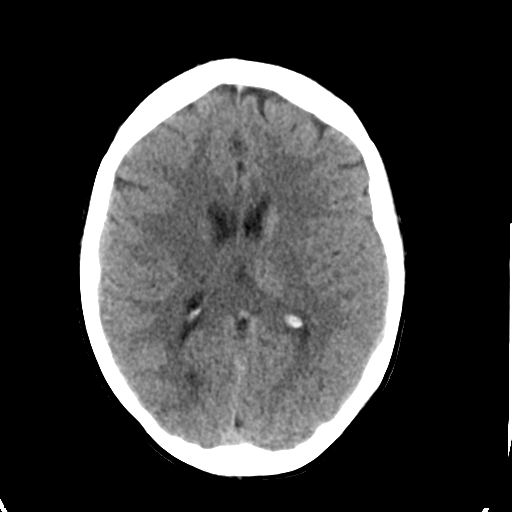
[im 21/33  brain]
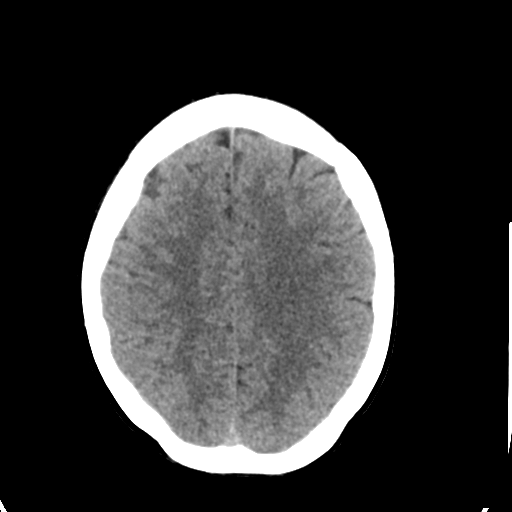
[im 21/33  bone]
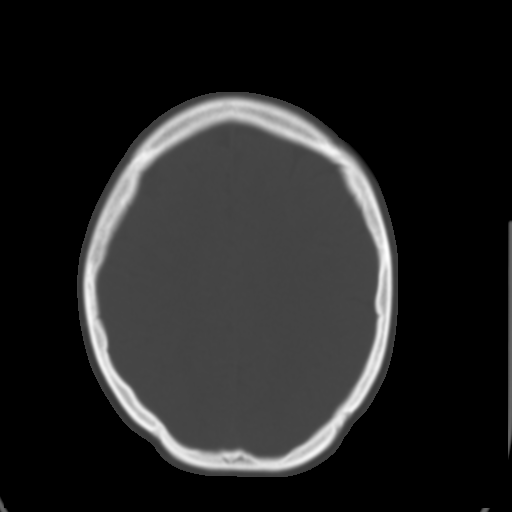
[im 25/33  brain]
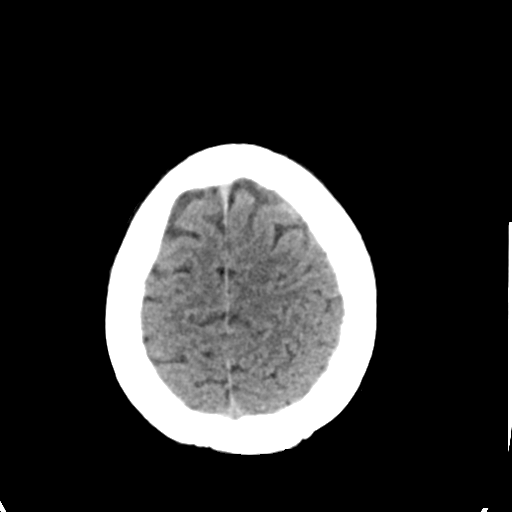
[im 29/33  brain]
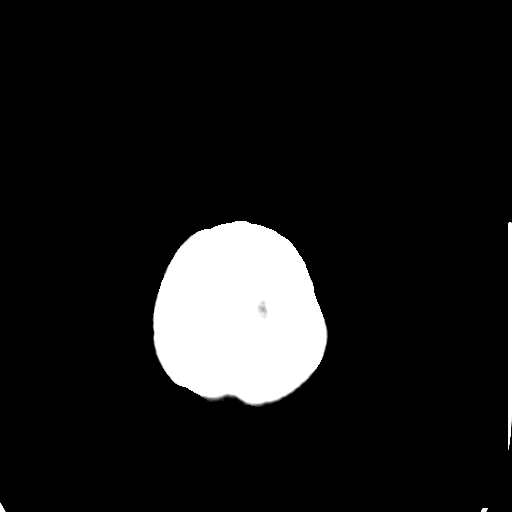

[Series 4: head bone · axial · 0.42mm/px · z∈[-82,-50]mm · 3 of 82 slices shown]
[im 9/82  bone]
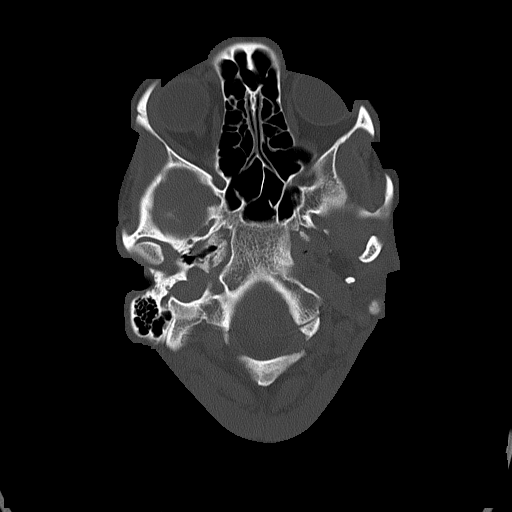
[im 17/82  bone]
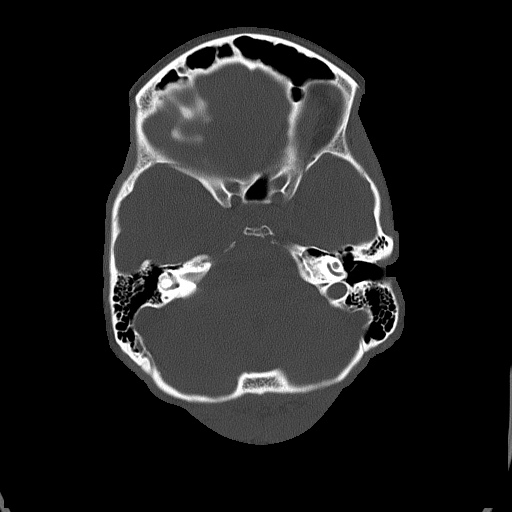
[im 25/82  bone]
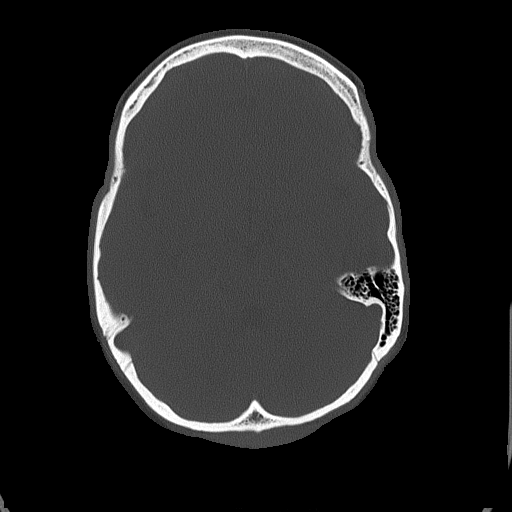

[Series 5: head without cor · coronal · non-contrast · 0.33mm/px · 3 of 67 slices shown]
[im 23/67  brain]
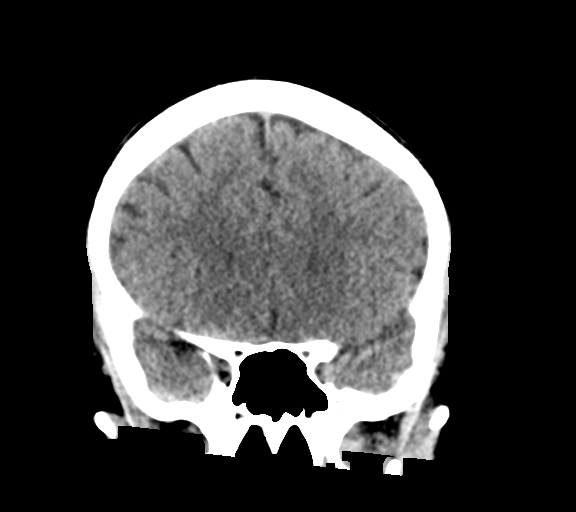
[im 30/67  brain]
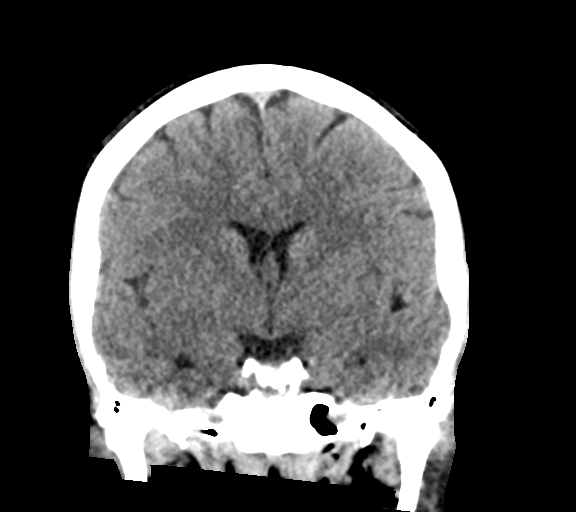
[im 37/67  brain]
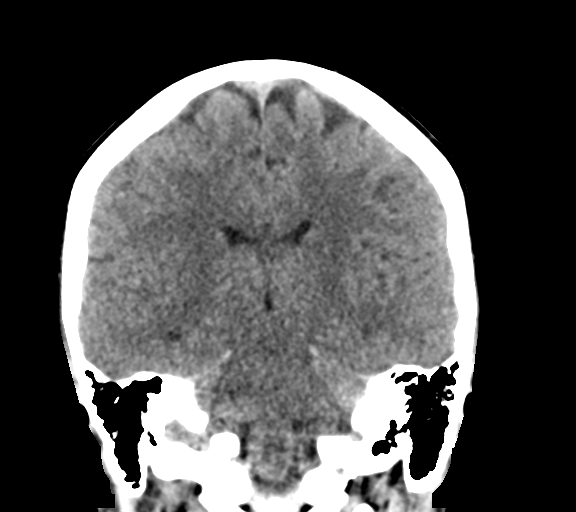

[Series 6: head without sag · sagittal · non-contrast · 0.35mm/px · 3 of 67 slices shown]
[im 24/67  brain]
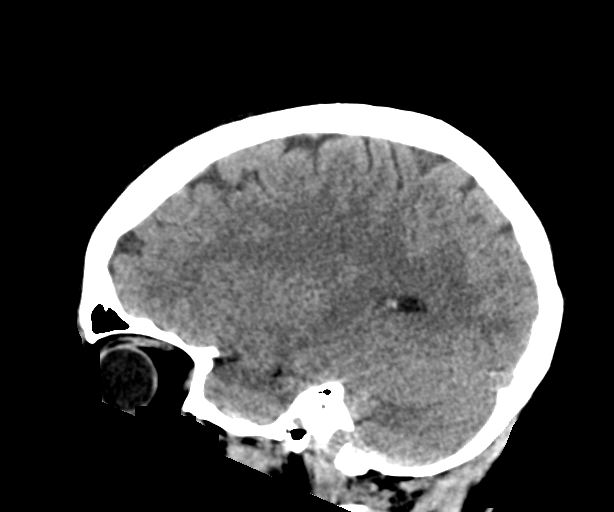
[im 34/67  brain]
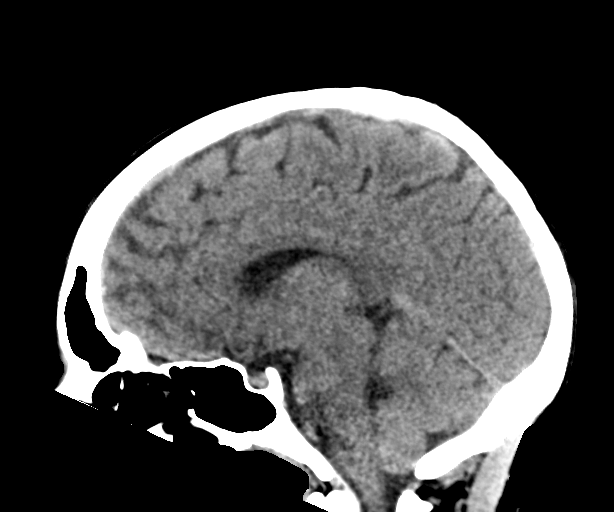
[im 43/67  brain]
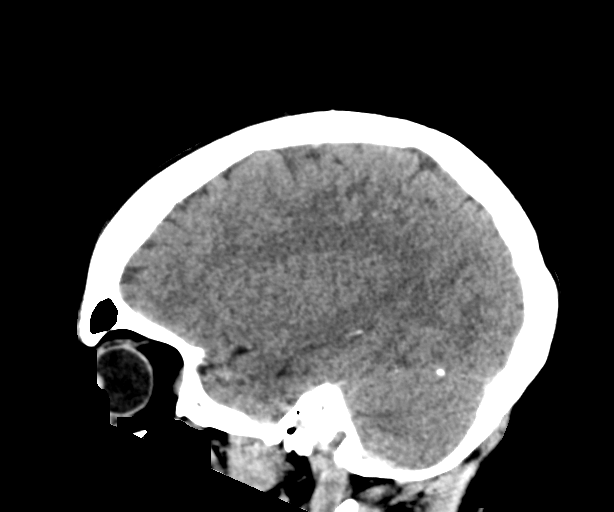

[16 of 47 positions shown; findings below may reference images not displayed]

FINDINGS: Brain: Left tentorial calcification as well as coarse calcification
in the adjacent occipital lobe, unchanged from prior exam. No
associated edema or mass effect. No intracranial hemorrhage, mass
effect, or midline shift. No hydrocephalus. The basilar cisterns are
patent. No evidence of territorial infarct or acute ischemia. No
extra-axial or intracranial fluid collection.

Vascular: No hyperdense vessel.

Skull: No fracture or focal lesion.

Sinuses/Orbits: Paranasal sinuses and mastoid air cells are clear.
Slight dysconjugate gaze, typically incidental.

Other: None.
IMPRESSION: 1.  No acute intracranial abnormality.
2. Small left occipital calcification and adjacent tentorial
calcification are likely dystrophic, unchanged from prior exam.

## 2018-08-31 MED ORDER — ACETAMINOPHEN 325 MG PO TABS: 650.0000 mg | ORAL_TABLET | Freq: Four times a day (QID) | ORAL | Status: DC | PRN

## 2018-08-31 MED ORDER — CHLORHEXIDINE GLUCONATE 0.12 % MT SOLN
15.0000 mL | Freq: Four times a day (QID) | OROMUCOSAL | Status: DC
  Filled 2018-08-31 (×4): qty 15

## 2018-08-31 MED ORDER — LIDOCAINE-EPINEPHRINE (PF) 2 %-1:200000 IJ SOLN
10.0000 mL | Freq: Once | INTRAMUSCULAR | Status: AC
  Administered 2018-08-31: 10 mL via INTRADERMAL
  Filled 2018-08-31: qty 20

## 2018-08-31 MED ORDER — LIDOCAINE VISCOUS HCL 2 % MT SOLN
15.0000 mL | Freq: Once | OROMUCOSAL | Status: AC
  Administered 2018-08-31: 15 mL via OROMUCOSAL
  Filled 2018-08-31: qty 15

## 2018-08-31 MED ORDER — CHLORHEXIDINE GLUCONATE 0.12 % MT SOLN
15.0000 mL | Freq: Once | OROMUCOSAL | Status: AC
  Administered 2018-08-31: 15 mL via OROMUCOSAL
  Filled 2018-08-31: qty 15

## 2018-08-31 NOTE — ED Notes (Signed)
Pt aware Telepsych to be performed soon.

## 2018-08-31 NOTE — ED Notes (Signed)
Pt wanded by security ; pt placed in purple scrubs at this time

## 2018-08-31 NOTE — BH Assessment (Addendum)
[facility] Assessment Progress Note     Patient was seen by Dr. Dwyane Dee.  Patient is not currently suicidal, homicidal or psychotic, but she is very depressed.  Patient is currently on probation for drug charges, she recently lost her job and she is homeless.  Patient continues to abuse drugs while on supervised probation.  Patient is seeking treatment for her SA issues and needs a safe place to stay in order to recover.  However, she has recently not been able to stay with her parents.  Mother was contacted for collateral information and to determine whether patient could stay with them or not.  Mother stated that she would need to talk to her husband and call back this afternoon with their answer.  Meanwhile, Dr. Dwyane Dee feels like patient should remain in the ED until parents make contact with this facility.  Addendum:  Patient's mother called and indicated that patient could stay at she and her husband's home upon discharge from ED.
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
## 2018-08-31 NOTE — ED Notes (Signed)
Pt's mother took all her belongings home including cell phone.

## 2018-08-31 NOTE — ED Notes (Signed)
Aaron Edelman charge RN notified pt will be admitted to [facility]. Pt denies SI/HI. Per psych pt reported calling the suicide hotline for the last 6 months and having urges to "blow her brains out"

## 2018-08-31 NOTE — ED Provider Notes (Signed)
Per psychiatry the patient is appropriate for discharge back to her parents domicile, while pursuing outpatient substance abuse therapy.   Carmin Muskrat, MD 08/31/18 1145

## 2018-08-31 NOTE — ED Notes (Addendum)
Pt arrived to [facility] - ambulatory - wearing burgundy scrubs. Sitter w/pt. Pt lying on bed - noted to be irritable - cursing - states "I'm not fucking staying. They were supposed to let me talk to a second person last and they didn't do their job". States "I never told them I was suicidal". When asked if SI - states not at this time but voices that she has felt that way. Refused to give timeframe of last episode. Advised pt will keep her updated on tx plan. Milk x 2 given as requested. Aware breakfast tray has been ordered. Pt voiced understanding of Medical Clearance Pt Policy form - copy given.

## 2018-08-31 NOTE — ED Notes (Addendum)
ALL Belongings returned to pt - bra and pants - pt has sandals at bedside. Parents have pt's all other belongings. Pt's mother advised pt's father will be arriving to pick her up in 30 min. Pt changed into blue paper scrub top. Escorted to lobby - stated preferred to wait in lobby. Resources discussed and given. Pt encouraged to follow up w/[facility]. Pt states "I'm not following up w/nobody".

## 2018-08-31 NOTE — ED Notes (Signed)
Pt noted to be eating w/o difficulty.
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
## 2018-09-15 ENCOUNTER — Ambulatory Visit: Payer: Self-pay | Admitting: Neurology

## 2018-11-26 ENCOUNTER — Ambulatory Visit: Payer: Self-pay | Admitting: Neurology

## 2018-11-26 ENCOUNTER — Encounter

## 2018-11-26 ENCOUNTER — Encounter: Payer: Self-pay | Admitting: Neurology

## 2019-01-16 ENCOUNTER — Other Ambulatory Visit: Payer: Self-pay

## 2019-01-16 ENCOUNTER — Emergency Department (HOSPITAL_COMMUNITY)
Admission: EM | Admit: 2019-01-16 | Discharge: 2019-01-18 | Disposition: A | Discharge status: Other Acute Inpatient Hospital | Payer: Self-pay | Attending: Emergency Medicine | Admitting: Emergency Medicine

## 2019-01-16 DIAGNOSIS — F918 Other conduct disorders: Secondary | ICD-10-CM | POA: Insufficient documentation

## 2019-01-16 DIAGNOSIS — R45851 Suicidal ideations: Secondary | ICD-10-CM | POA: Insufficient documentation

## 2019-01-16 DIAGNOSIS — F39 Unspecified mood [affective] disorder: Secondary | ICD-10-CM | POA: Insufficient documentation

## 2019-01-16 DIAGNOSIS — F3111 Bipolar disorder, current episode manic without psychotic features, mild: Secondary | ICD-10-CM | POA: Diagnosis present

## 2019-01-16 DIAGNOSIS — Z046 Encounter for general psychiatric examination, requested by authority: Secondary | ICD-10-CM | POA: Insufficient documentation

## 2019-01-16 DIAGNOSIS — Z9114 Patient's other noncompliance with medication regimen: Secondary | ICD-10-CM | POA: Insufficient documentation

## 2019-01-16 LAB — COMPREHENSIVE METABOLIC PANEL
ALBUMIN: 4.3 g/dL (ref 3.5–5.0)
ALK PHOS: 62 U/L (ref 38–126)
ALT: 25 U/L (ref 0–44)
AST: 24 U/L (ref 15–41)
Anion gap: 7 (ref 5–15)
BILIRUBIN TOTAL: 0.4 mg/dL (ref 0.3–1.2)
BUN: 23 mg/dL — ABNORMAL HIGH (ref 6–20)
CALCIUM: 9.1 mg/dL (ref 8.9–10.3)
CO2: 27 mmol/L (ref 22–32)
Chloride: 105 mmol/L (ref 98–111)
Creatinine, Ser: 0.7 mg/dL (ref 0.44–1.00)
GFR calc Af Amer: >60 mL/min (ref >60)
GLUCOSE: 99 mg/dL (ref 70–99)
POTASSIUM: 3.9 mmol/L (ref 3.5–5.1)
Sodium: 139 mmol/L (ref 135–145)
TOTAL PROTEIN: 7.3 g/dL (ref 6.5–8.1)

## 2019-01-16 LAB — CBC WITH DIFFERENTIAL/PLATELET
Abs Immature Granulocytes: 0.03 10*3/uL (ref 0.00–0.07)
Basophils Absolute: 0.1 10*3/uL (ref 0.0–0.1)
Basophils Relative: 1 %
EOS ABS: 0.3 10*3/uL (ref 0.0–0.5)
Eosinophils Relative: 3 %
HCT: 42.7 % (ref 36.0–46.0)
Hemoglobin: 14.1 g/dL (ref 12.0–15.0)
Immature Granulocytes: 0 %
Lymphocytes Relative: 32 %
Lymphs Abs: 2.7 10*3/uL (ref 0.7–4.0)
MCH: 32.7 pg (ref 26.0–34.0)
MCHC: 33 g/dL (ref 30.0–36.0)
MCV: 99.1 fL (ref 80.0–100.0)
MONO ABS: 0.6 10*3/uL (ref 0.1–1.0)
Monocytes Relative: 8 %
Neutro Abs: 4.8 10*3/uL (ref 1.7–7.7)
Neutrophils Relative %: 56 %
Platelets: 206 10*3/uL (ref 150–400)
RBC: 4.31 MIL/uL (ref 3.87–5.11)
RDW: 11.8 % (ref 11.5–15.5)
WBC: 8.4 10*3/uL (ref 4.0–10.5)
nRBC: 0 % (ref 0.0–0.2)

## 2019-01-16 LAB — I-STAT BETA HCG BLOOD, ED (MC, WL, AP ONLY): I-stat hCG, quantitative: <5 m[IU]/mL (ref <5)

## 2019-01-16 LAB — RAPID URINE DRUG SCREEN, HOSP PERFORMED
AMPHETAMINES: NOT DETECTED
Barbiturates: NOT DETECTED
Benzodiazepines: NOT DETECTED
Cocaine: NOT DETECTED
Opiates: NOT DETECTED
Tetrahydrocannabinol: NOT DETECTED

## 2019-01-16 LAB — ETHANOL

## 2019-01-16 MED ORDER — NICOTINE 21 MG/24HR TD PT24
21.0000 mg | MEDICATED_PATCH | Freq: Once | TRANSDERMAL | Status: AC
  Administered 2019-01-16: 21 mg via TRANSDERMAL
  Filled 2019-01-16: qty 1

## 2019-01-16 MED ORDER — ALUM & MAG HYDROXIDE-SIMETH 200-200-20 MG/5ML PO SUSP
30.0000 mL | Freq: Four times a day (QID) | ORAL | Status: DC | PRN
  Administered 2019-01-17: 30 mL via ORAL
  Filled 2019-01-16: qty 30

## 2019-01-16 MED ORDER — ACETAMINOPHEN 325 MG PO TABS: 650.0000 mg | ORAL_TABLET | ORAL | Status: DC | PRN

## 2019-01-16 MED ORDER — ZOLPIDEM TARTRATE 5 MG PO TABS: 5.0000 mg | ORAL_TABLET | Freq: Every evening | ORAL | Status: DC | PRN

## 2019-01-16 MED ORDER — ALPRAZOLAM 0.5 MG PO TABS
0.5000 mg | ORAL_TABLET | Freq: Once | ORAL | Status: AC
  Administered 2019-01-16: 0.5 mg via ORAL
  Filled 2019-01-16: qty 1

## 2019-01-16 NOTE — ED Notes (Signed)
Denies any thoughts or intention to hurt self. States she doesn't need to be here. If she is released from the acute unit states she would stay with a friend for awhile until she can get a job and a place of her own. Doesn't want to return to her parents because it is not a positive situation for her. She has been able to make her needs known to Probation officer, apologized several times because she states she feels she is bothering me. Her requests have been appropriate and reassured she was not bothering me. Offered her sleeping med to her but she would like to try to sleep without it and to let her brain work things out naturally.
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
## 2019-01-16 NOTE — Progress Notes (Signed)
01/16/2019  1649  Labs drawn. Dark green tube sent to mini lab.  Dark green, light green, purple, and gold tubes sent to main lab.

## 2019-01-16 NOTE — ED Notes (Signed)
Bed: WA30 Expected date:  Expected time:  Means of arrival:  Comments: 
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
## 2019-01-17 DIAGNOSIS — F3111 Bipolar disorder, current episode manic without psychotic features, mild: Secondary | ICD-10-CM | POA: Diagnosis present

## 2019-01-17 MED ORDER — LAMOTRIGINE 25 MG PO TABS
25.0000 mg | ORAL_TABLET | Freq: Every day | ORAL | Status: DC
  Administered 2019-01-17 – 2019-01-18 (×2): 25 mg via ORAL
  Filled 2019-01-17 (×2): qty 1

## 2019-01-17 MED ORDER — GABAPENTIN 300 MG PO CAPS: 300.0000 mg | ORAL_CAPSULE | Freq: Three times a day (TID) | ORAL | Status: DC

## 2019-01-17 MED ORDER — OLANZAPINE 2.5 MG PO TABS
2.5000 mg | ORAL_TABLET | Freq: Two times a day (BID) | ORAL | Status: DC
  Administered 2019-01-17 – 2019-01-18 (×2): 2.5 mg via ORAL
  Filled 2019-01-17 (×2): qty 1

## 2019-01-17 MED ORDER — OLANZAPINE 5 MG PO TABS
5.0000 mg | ORAL_TABLET | Freq: Two times a day (BID) | ORAL | Status: DC
  Administered 2019-01-17: 2.5 mg via ORAL
  Filled 2019-01-17: qty 1

## 2019-01-17 MED ORDER — SIMETHICONE 40 MG/0.6ML PO SUSP (UNIT DOSE)
40.0000 mg | Freq: Once | ORAL | Status: DC
  Filled 2019-01-17: qty 0.6

## 2019-01-17 MED ORDER — NICOTINE 21 MG/24HR TD PT24
21.0000 mg | MEDICATED_PATCH | Freq: Every day | TRANSDERMAL | Status: DC
  Administered 2019-01-17 – 2019-01-18 (×3): 21 mg via TRANSDERMAL
  Filled 2019-01-17 (×3): qty 1
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
## 2019-01-17 NOTE — ED Notes (Addendum)
Pt resting, became upset that she was being admitted, began to cry and threw cup of ice across the room. Pt report that she already had to do OP, and does not need inpatient. Pt then layed back down.

## 2019-01-17 NOTE — ED Notes (Signed)
Up tot he bathroom to shower and change scrubs 

## 2019-01-17 NOTE — ED Notes (Signed)
On the phone 

## 2019-01-17 NOTE — Consult Note (Addendum)
[facility] Face-to-Face Psychiatry Consult   Reason for Consult:  IVC due to suicide threat Referring Physician:  EDP Patient Identification: DALLAS SCORSONE MRN:  [id number] Principal Diagnosis: Bipolar affective disorder, currently manic, mild ([place]) Diagnosis:  Principal Problem:   Bipolar affective disorder, currently manic, mild ([place])   Total Time spent with patient: 30 minutes  Subjective:  CC: " my dad IVC'ed me because he knows that at this time if he hits me I will call the police".  Sonya Prince is a 28 y.o. female patient presents involuntarily accompanied by law enforcement after her father took out IVC paperwork stating that pt has been making suicidal statements and threatening to harm her father for "several months".   HPI:  Patient reported a history of MDD, GAD and ADHD. Per chart review patient was hospitalized after presenting with manic behavior in 2018. Admits she had not been able to see mental health specialists as she is self-pay. She was involuntarily accompanied by law enforcement after her father took out IVC paperwork stating that pt has been making suicidal statements and threatening to harm her father for "several months". A friend of hers killed herself this week.  On Evaluation Today: Patient hyperverbal  and occasionally tangential during interview although cooperative during interview. She reports that the involuntary commitment by her father was retaliatory and because her parents have "personality disorders". 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 She reported that this client died about 2 months later and she has been struggling with grief and loss because she had cared for him for about 6 years. She admitted she had been staying with "friends" intermittently until she went to her parents home "a few days ago" whereby they had a disagreement and her father had her  committed. She admits she has not seen her psychiatrist or therapist since July 2019 because of financial constraints. Reported previously taking Prozac, Adderral and Xanax. Patient also reports using THC and recently "one time use of heroin" to manage stressors and emotions. Admitted she doesn't have an apartment but will be staying with her friend when she is discharged. She denied suicidal or homicidal ideation, no auditory or visual hallucination.   Based on collateral information, her history is not very reliable.  She was arrested this week for drinking and assault and battery.  Her father IVC'd her for increase in suicidal ideations, facilitated by alcohol and drug use along with stressors, especially the suicide of her friend.  Medications help her mood but has not received these.  History of hospitalization for mania with use of Zyprexa.    Past Psychiatric History: Bipolar Disorder, Major Depression Disorder, ADHD (per pt), GAD  Risk to Self: Suicidal Ideation: No-Not Currently/Within Last 6 Months(pt denies, but IVC says that she has made statements) Suicidal Intent: No Is patient at risk for suicide?: Yes Suicidal Plan?: No Access to Means: No(pt denies) What has been your use of drugs/alcohol within the last 12 months?: heroin, TCH Other Self Harm Risks: SA Triggers for Past Attempts: (none known) Intentional Self Injurious Behavior: None Risk to Others: Homicidal Ideation: (pt refused to answer) Thoughts of Harm to Others: Yes-Currently Present Comment - Thoughts of Harm to Others: "i am not saying anything" Current Homicidal Intent: No Current Homicidal Plan: No Access to Homicidal Means: No Identified Victim: (possibly parents) History of harm to others?: (hx of violence with family)  Assessment of Violence: In past 6-12 months Violent Behavior Description: fighting with family Does patient have access to weapons?: No Criminal Charges Pending?: No Does patient have a court  date: Yes Court Date: (April) Prior Inpatient Therapy: Prior Inpatient Therapy: No Prior Outpatient Therapy: Prior Outpatient Therapy: Yes Prior Therapy Dates: last year Prior Therapy Facilty/Provider(s): Dr. [facility], [facility] Reason Reason for Treatment: depression Does patient have an ACCT team?: No Does patient have Intensive In-House Services?  : No Does patient have [facility] services? : No Does patient have P4CC services?: No  Past Medical History:  Past Medical History:  Diagnosis Date  . Anxiety   . Meningitis   . PTSD (post-traumatic stress disorder)     Past Surgical History:  Procedure Laterality Date  . WISDOM TOOTH EXTRACTION     Family History: personality disorders--whole family Family Psychiatric  History: per pt parents have personality disorders Social History:  Social History   Substance and Sexual Activity  Alcohol Use Yes   Comment: occassionally     Social History   Substance and Sexual Activity  Drug Use Yes  . Types: Marijuana    Social History   Socioeconomic History  . Marital status: Single    Spouse name: Not on file  . Number of children: Not on file  . Years of education: Not on file  . Highest education level: Not on file  Occupational History  . Not on file  Social Needs  . Financial resource strain: Not on file  . Food insecurity:    Worry: Not on file    Inability: Not on file  . Transportation needs:    Medical: Not on file    Non-medical: Not on file  Tobacco Use  . Smoking status: Never Smoker  . Smokeless tobacco: Never Used  Substance and Sexual Activity  . Alcohol use: Yes    Comment: occassionally  . Drug use: Yes    Types: Marijuana  . Sexual activity: Yes  Lifestyle  . Physical activity:    Days per week: Not on file    Minutes per session: Not on file  . Stress: Not on file  Relationships  . Social connections:    Talks on phone: Not on file    Gets together: Not on file    Attends religious service: Not on  file    Active member of club or organization: Not on file    Attends meetings of clubs or organizations: Not on file    Relationship status: Not on file  Other Topics Concern  . Not on file  Social History Narrative  . Not on file   Additional Social History: N/A    Allergies:   Allergies  Allergen Reactions  . Doxycycline Swelling    Labs:  Results for orders placed or performed during the hospital encounter of 01/16/19 (from the past 48 hour(s))  Comprehensive metabolic panel     Status: Abnormal   Collection Time: 01/16/19  4:30 PM  Result Value Ref Range   Sodium 139 135 - 145 mmol/L   Potassium 3.9 3.5 - 5.1 mmol/L   Chloride 105 98 - 111 mmol/L   CO2 27 22 - 32 mmol/L   Glucose, Bld 99 70 - 99 mg/dL   BUN 23 (H) 6 - 20 mg/dL   Creatinine, Ser 0.70 0.44 - 1.00 mg/dL   Calcium 9.1 8.9 - 10.3 mg/dL   Total Protein 7.3 6.5 - 8.1 g/dL   Albumin 4.3 3.5 - 5.0  g/dL   AST 24 15 - 41 U/L   ALT 25 0 - 44 U/L   Alkaline Phosphatase 62 38 - 126 U/L   Total Bilirubin 0.4 0.3 - 1.2 mg/dL   GFR calc non Af Amer >60 >60 mL/min   GFR calc Af Amer >60 >60 mL/min   Anion gap 7 5 - 15    Comment: Performed at [facility], [place] [address] [place], [place] [address]  Ethanol     Status: None   Collection Time: 01/16/19  4:30 PM  Result Value Ref Range   Alcohol, Ethyl (B) <10 <10 mg/dL    Comment: (NOTE) Lowest detectable limit for serum alcohol is 10 mg/dL. For medical purposes only. Performed at  [facility], [place] [address], [place], [place] [address]   Urine rapid drug screen (hosp performed)     Status: None   Collection Time: 01/16/19  4:30 PM  Result Value Ref Range   Opiates NONE DETECTED NONE DETECTED   Cocaine NONE DETECTED NONE DETECTED   Benzodiazepines NONE DETECTED NONE DETECTED   Amphetamines NONE DETECTED NONE DETECTED   Tetrahydrocannabinol NONE DETECTED NONE DETECTED   Barbiturates NONE DETECTED NONE DETECTED     Comment: (NOTE) DRUG SCREEN FOR MEDICAL PURPOSES ONLY.  IF CONFIRMATION IS NEEDED FOR ANY PURPOSE, NOTIFY LAB WITHIN 5 DAYS. LOWEST DETECTABLE LIMITS FOR URINE DRUG SCREEN Drug Class                     Cutoff (ng/mL) Amphetamine and metabolites    1000 Barbiturate and metabolites    200 Benzodiazepine                 734 Tricyclics and metabolites     300 Opiates and metabolites        300 Cocaine and metabolites        300 THC                            50 Performed at [facility], [place] [address] [place], [place] [address]   CBC with Diff     Status: None   Collection Time: 01/16/19  4:30 PM  Result Value Ref Range   WBC 8.4 4.0 - 10.5 K/uL   RBC 4.31 3.87 - 5.11 MIL/uL   Hemoglobin 14.1 12.0 - 15.0 g/dL   HCT 42.7 36.0 - 46.0 %   MCV 99.1 80.0 - 100.0 fL   MCH 32.7 26.0 - 34.0 pg   MCHC 33.0 30.0 - 36.0 g/dL   RDW 11.8 11.5 - 15.5 %   Platelets 206 150 - 400 K/uL   nRBC 0.0 0.0 - 0.2 %   Neutrophils Relative % 56 %   Neutro Abs 4.8 1.7 - 7.7 K/uL   Lymphocytes Relative 32 %   Lymphs Abs 2.7 0.7 - 4.0 K/uL   Monocytes Relative 8 %   Monocytes Absolute 0.6 0.1 - 1.0 K/uL   Eosinophils Relative 3 %   Eosinophils Absolute 0.3 0.0 - 0.5 K/uL   Basophils Relative 1 %   Basophils Absolute 0.1 0.0 - 0.1 K/uL   Immature Granulocytes 0 %   Abs Immature Granulocytes 0.03 0.00 - 0.07 K/uL    Comment: Performed at [facility], [place] [address], [place], [place] [address]  I-Stat beta hCG blood, ED     Status: None   Collection Time: 01/16/19  4:51  PM  Result Value Ref Range   I-stat hCG, quantitative <5.0 <5 mIU/mL   Comment 3            Comment:   GEST. AGE      CONC.  (mIU/mL)   <=1 WEEK        5 - 50     2 WEEKS       50 - 500     3 WEEKS       100 - 10,000     4 WEEKS     1,000 - 30,000        FEMALE AND NON-PREGNANT FEMALE:     LESS THAN 5 mIU/mL     Current Facility-Administered Medications  Medication Dose Route Frequency  Provider Last Rate Last Dose  . acetaminophen (TYLENOL) tablet 650 mg  650 mg Oral Q4H PRN Hayden Rasmussen, MD      . alum & mag hydroxide-simeth (MAALOX/MYLANTA) 200-200-20 MG/5ML suspension 30 mL  30 mL Oral Q6H PRN Hayden Rasmussen, MD      . gabapentin (NEURONTIN) capsule 300 mg  300 mg Oral TID Patrecia Pour, NP      . nicotine (NICODERM CQ - dosed in mg/24 hours) patch 21 mg  21 mg Transdermal Once Hayden Rasmussen, MD   21 mg at 01/16/19 1656  . OLANZapine (ZYPREXA) tablet 5 mg  5 mg Oral BID Patrecia Pour, NP       Current Outpatient Medications  Medication Sig Dispense Refill  . ibuprofen (ADVIL,MOTRIN) 200 MG tablet Take 400 mg by mouth daily as needed for headache.    . ALPRAZolam (XANAX) 0.5 MG tablet Take 0.5 mg by mouth 3 (three) times daily as needed for anxiety.    Marland Kitchen doxycycline (VIBRA-TABS) 100 MG tablet Take 1 tablet (100 mg total) by mouth 2 (two) times daily. (Patient not taking: Reported on 08/31/2018) 20 tablet 0  . fluconazole (DIFLUCAN) 200 MG tablet Take 1 tablet now, than wait 3 days and take the second tablet. (Patient not taking: Reported on 01/16/2019) 2 tablet 1  . ondansetron (ZOFRAN) 4 MG tablet Take 1 tablet (4 mg total) by mouth every 8 (eight) hours as needed for nausea or vomiting. (Patient not taking: Reported on 01/16/2019) 30 tablet 1  . predniSONE (DELTASONE) 50 MG tablet Take 1 tablet daily with food (Patient not taking: Reported on 01/16/2019) 5 tablet 0    Musculoskeletal: Strength & Muscle Tone: within normal limits Gait & Station: not observed Patient leans: N/A  Psychiatric Specialty Exam: Physical Exam  Nursing note and vitals reviewed. Constitutional: She is oriented to person, place, and time. She appears well-developed and well-nourished.  HENT:  Head: Normocephalic and atraumatic.  Neck: Normal range of motion.  Respiratory: Effort normal.  Musculoskeletal: Normal range of motion.  Neurological: She is alert and oriented to person,  place, and time.  Psychiatric: Her mood appears anxious. Her speech is tangential. She is hyperactive. Cognition and memory are normal. She expresses impulsivity. She expresses suicidal ideation. She expresses suicidal plans. She expresses no homicidal plans. She is inattentive.    Review of Systems  Constitutional: Negative.   Psychiatric/Behavioral: Positive for depression, substance abuse and suicidal ideas. Negative for hallucinations and memory loss. The patient is nervous/anxious.   All other systems reviewed and are negative.   Blood pressure 99/69, pulse 67, temperature (!) 97.4 F (36.3 C), resp. rate 16, height 5\' 2"  (1.575 m), weight 46.7 kg, last menstrual period  01/16/2019, SpO2 96 %.Body mass index is 18.84 kg/m.  General Appearance: Casual  Eye Contact:  Good  Speech:  hyperverbal  Volume:  Normal  Mood:  Anxious  Affect:  Appropriate  Thought Process:  Descriptions of Associations: Tangential  Orientation:  Full (Time, Place, and Person)  Thought Content:  Logical  Suicidal Thoughts:  Yes per IVC  Homicidal Thoughts:  Yes per IVC  Memory:  Immediate;   Fair Recent;   Fair Remote;   Fair  Judgement:  Impaired  Insight:  Shallow  Psychomotor Activity:  Normal  Concentration:  Concentration: Fair and Attention Span: Fair  Recall:  [employer] of Knowledge:  Fair  Language:  Good  Akathisia:  No  Handed:  Right  AIMS (if indicated):   N/A  Assets:  Leisure Time Physical Health Social Support  ADL's:  Intact  Cognition:  WNL  Sleep:   N/A     Treatment Plan Summary: Daily contact with patient to assess and evaluate symptoms and progress in treatment, Medication management and Plan to admit to inpatient   Bipolar Disorder -Started Zyprexa 2.5mg  BID -Started Lamictal 25 mg daily.  Anxiety -Started Gabapentin 300mg  TID for anxiety  Disposition: Recommend psychiatric Inpatient admission.  Waylan Boga, NP 01/17/2019 10:59 AM   Patient seen face-to-face  for psychiatric evaluation, chart reviewed and case discussed with the physician extender and developed treatment plan. Reviewed the information documented and agree with the treatment plan.  Buford Dresser, DO 01/17/19 3:44 PM

## 2019-01-17 NOTE — ED Notes (Addendum)
Sitting quielty, angry, cursing at times, but calmer.  Medication discussed, pt agreed to take zyprexa.

## 2019-01-17 NOTE — ED Notes (Addendum)
Up to the desk, nicotine patch has come off, pt gave the patch to this Probation officer.  Pt calm.

## 2019-01-17 NOTE — Progress Notes (Addendum)
CSW received a call from Katie Pt has been accepted by: Mikel Cella ([place] [facility] Number for report is: [phone] Pt's unit/room/bed number will be: 2592 Bed 1 Accepting physician: Delray Alt MD  Pt can arrive ASAP on 2/17 or ASAP on 2/18.  Of note, pt is IVC'd and will require IVC transport.  CSW will update RN.  [facility] RN staff updated.  Alphonse Guild. Lysandra Loughmiller, LCSW, LCAS, CSI Clinical Social Worker Ph: [phone]

## 2019-01-17 NOTE — ED Notes (Addendum)
Up to the desk, angry about having to stay here and be admitted, taking medications. Support given, pt remains redirectable, magazines given.

## 2019-01-17 NOTE — ED Notes (Signed)
Up to the bathroom 

## 2019-01-17 NOTE — ED Notes (Signed)
Pt alert and oriented. Pt denies any pain. Pt denies any si, hi, or svh at this time. Pt resting in bed. Pt safe will continue to monitor.

## 2019-01-17 NOTE — ED Notes (Signed)
Calmer, sitting quielty in room, nad

## 2019-01-17 NOTE — BH Assessment (Addendum)
[facility] Assessment Progress Note  Per Buford Dresser, DO, this pt requires psychiatric hospitalization at this time.  Pt presents under IVC initiated by pt's father, which has been upheld by EDP Aletta Edouard, MD.  The following facilities have been contacted to seek placement for this pt, with results as noted:  Beds available, information sent, decision pending:  [facility]   At capacity:  [facility] [place], [place] Coordinator [phone]

## 2019-01-17 NOTE — ED Notes (Addendum)
Pt is aware that she is going to be admitted.  Pt became angry and tearful about having to stay and stated that she was going to "kill him..." "both of them" when she gets out.  Pt also stated that "people like me should not exist...never going to have kids", she just wanted to "make money...look good...live my life..."  Pt also declined to take medications prescribed. Hepatic Function Latest Ref Rng & Units 01/16/2019 08/30/2018 02/04/2018  Total Protein 6.5 - 8.1 g/dL 7.3 6.6 7.1  Albumin 3.5 - 5.0 g/dL 4.3 4.0 4.3  AST 15 - 41 U/L '24 19 28  ' ALT 0 - 44 U/L '25 17 24  ' Alk Phosphatase 38 - 126 U/L 62 46 49  Total Bilirubin 0.3 - 1.2 mg/dL 0.4 0.4 0.7  Bilirubin, Direct 0.0 - 0.2 mg/dL - 0.1 -    Hepatic Function Latest Ref Rng & Units 01/16/2019 08/30/2018 02/04/2018  Total Protein 6.5 - 8.1 g/dL 7.3 6.6 7.1  Albumin 3.5 - 5.0 g/dL 4.3 4.0 4.3  AST 15 - 41 U/L '24 19 28  ' ALT 0 - 44 U/L '25 17 24  ' Alk Phosphatase 38 - 126 U/L 62 46 49  Total Bilirubin 0.3 - 1.2 mg/dL 0.4 0.4 0.7  Bilirubin, Direct 0.0 - 0.2 mg/dL - 0.1 -

## 2019-01-17 NOTE — ED Notes (Addendum)
Up in hall angry, cursing, redirectable back to room w/o difficulty.

## 2019-01-17 NOTE — ED Notes (Addendum)
Standing in room door, pt was verbally confrontational with other pt's daughter, pt redirected back into room and instructed not to engage other person.

## 2019-01-18 NOTE — ED Notes (Signed)
Law enforcement called for transport. Message left.

## 2019-01-18 NOTE — ED Provider Notes (Signed)
Patient accepted to [facility].  Dr. Loanne Drilling is excepting.  Patient is in no acute distress with stable vitals.   Sherwood Gambler, MD 01/18/19 [phone]

## 2019-01-18 NOTE — ED Notes (Signed)
Sheriff on unit to transfer pt per MD order. Personal property given to sheriff for transfer. Ambulatory off unit in law enforcement custody.

## 2019-03-12 ENCOUNTER — Encounter (HOSPITAL_COMMUNITY): Payer: Self-pay | Admitting: Emergency Medicine

## 2019-03-12 ENCOUNTER — Other Ambulatory Visit: Payer: Self-pay

## 2019-03-12 ENCOUNTER — Emergency Department (HOSPITAL_COMMUNITY)
Admission: EM | Admit: 2019-03-12 | Discharge: 2019-03-12 | Disposition: A | Discharge status: Home / Self Care | Payer: Self-pay | Attending: Emergency Medicine | Admitting: Emergency Medicine

## 2019-03-12 ENCOUNTER — Emergency Department (HOSPITAL_COMMUNITY): Payer: Self-pay

## 2019-03-12 DIAGNOSIS — A5901 Trichomonal vulvovaginitis: Secondary | ICD-10-CM

## 2019-03-12 DIAGNOSIS — F319 Bipolar disorder, unspecified: Secondary | ICD-10-CM | POA: Insufficient documentation

## 2019-03-12 DIAGNOSIS — A5909 Other urogenital trichomoniasis: Secondary | ICD-10-CM | POA: Insufficient documentation

## 2019-03-12 DIAGNOSIS — N390 Urinary tract infection, site not specified: Secondary | ICD-10-CM | POA: Insufficient documentation

## 2019-03-12 DIAGNOSIS — R21 Rash and other nonspecific skin eruption: Secondary | ICD-10-CM | POA: Insufficient documentation

## 2019-03-12 LAB — URINALYSIS, ROUTINE W REFLEX MICROSCOPIC
Bilirubin Urine: NEGATIVE
Glucose, UA: NEGATIVE mg/dL
Hgb urine dipstick: NEGATIVE
Ketones, ur: NEGATIVE mg/dL
Nitrite: NEGATIVE
Protein, ur: NEGATIVE mg/dL
Specific Gravity, Urine: 1.02 (ref 1.005–1.030)
pH: 5 (ref 5.0–8.0)

## 2019-03-12 LAB — WET PREP, GENITAL
Clue Cells Wet Prep HPF POC: NONE SEEN
Sperm: NONE SEEN
Yeast Wet Prep HPF POC: NONE SEEN

## 2019-03-12 LAB — PREGNANCY, URINE: Preg Test, Ur: NEGATIVE

## 2019-03-12 IMAGING — US TRANSVAGINAL ULTRASOUND OF PELVIS
1 series · 13 of 25 positions shown · non-contrast
Comparison: Pelvic CT [DATE]

CLINICAL DATA: Left lower quadrant abdominal pain for 1 month.
History of pelvic inflammatory disease.

EXAM:
TRANSABDOMINAL AND TRANSVAGINAL ULTRASOUND OF PELVIS
DOPPLER ULTRASOUND OF OVARIES
TECHNIQUE: Both transabdominal and transvaginal ultrasound examinations of the
pelvis were performed. Transabdominal technique was performed for
global imaging of the pelvis including uterus, ovaries, adnexal
regions, and pelvic cul-de-sac.
It was necessary to proceed with endovaginal exam following the
transabdominal exam to visualize the ovaries to better advantage.
Color and duplex Doppler ultrasound was utilized to evaluate blood
flow to the ovaries.

[Series 1: transvaginal ultrasound of pelvis · 13 of 59 slices shown]
[im 1/59]
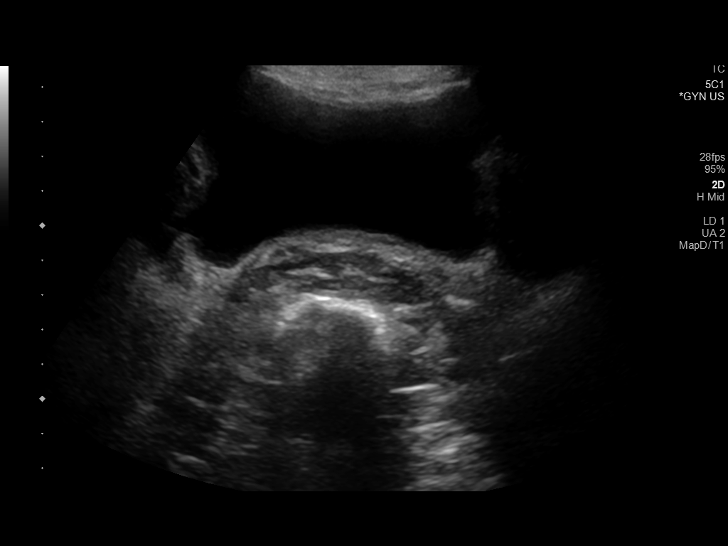
[im 5/59]
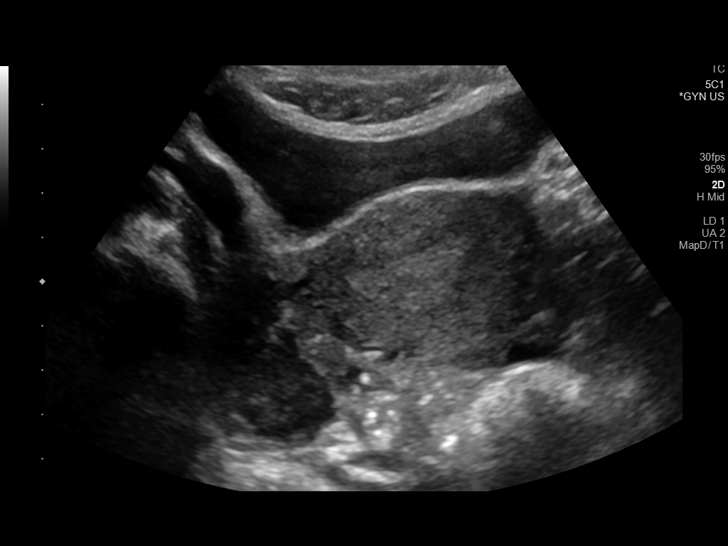
[im 10/59]
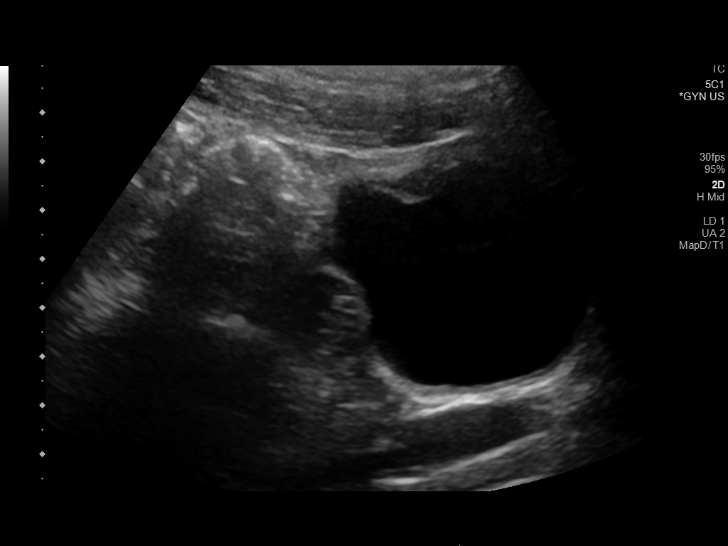
[im 15/59]
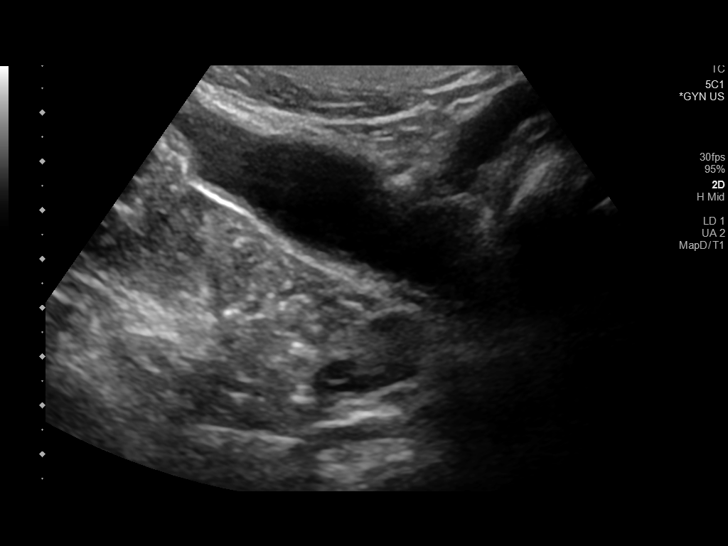
[im 20/59]
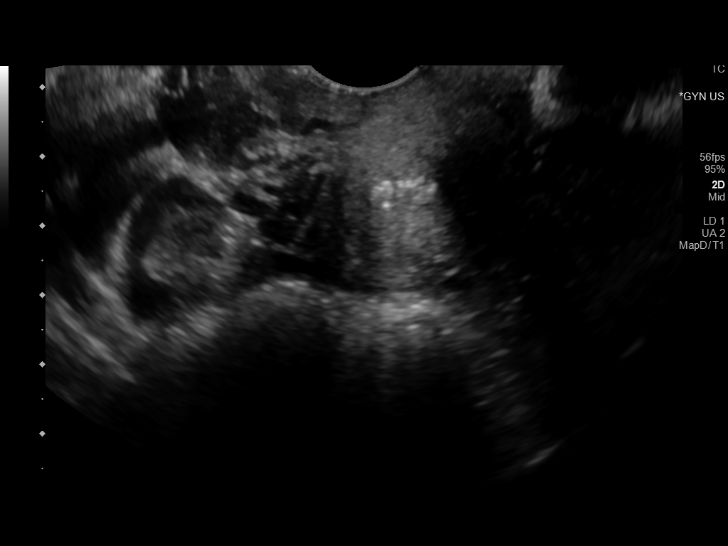
[im 25/59]
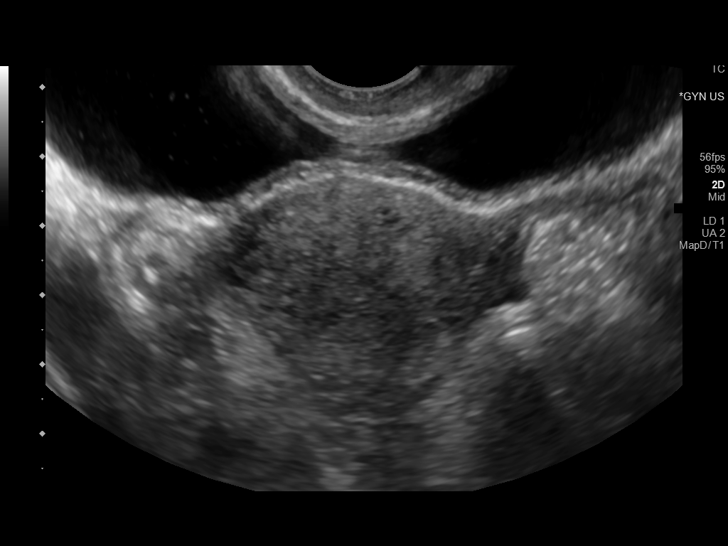
[im 30/59]
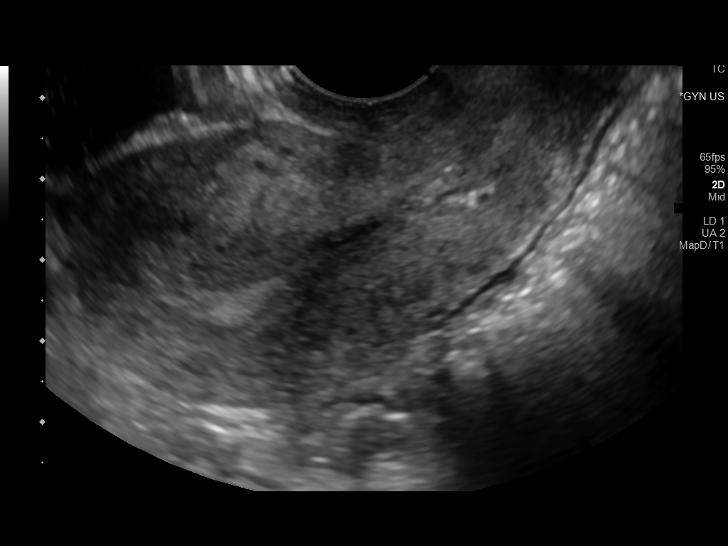
[im 34/59]
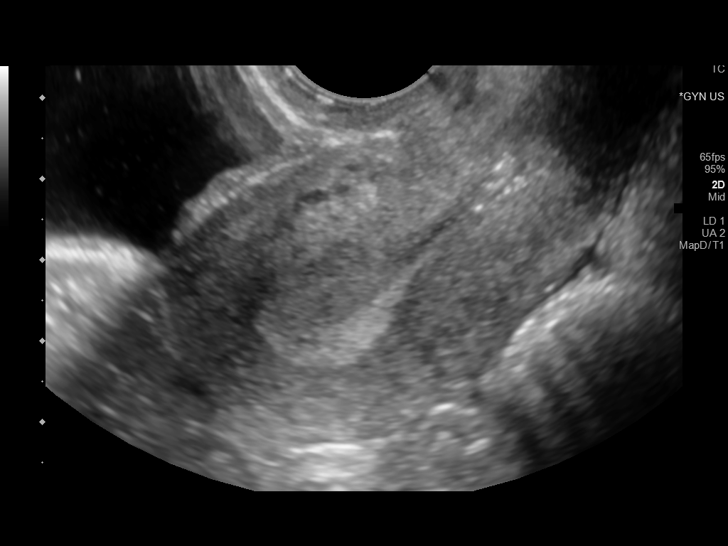
[im 39/59]
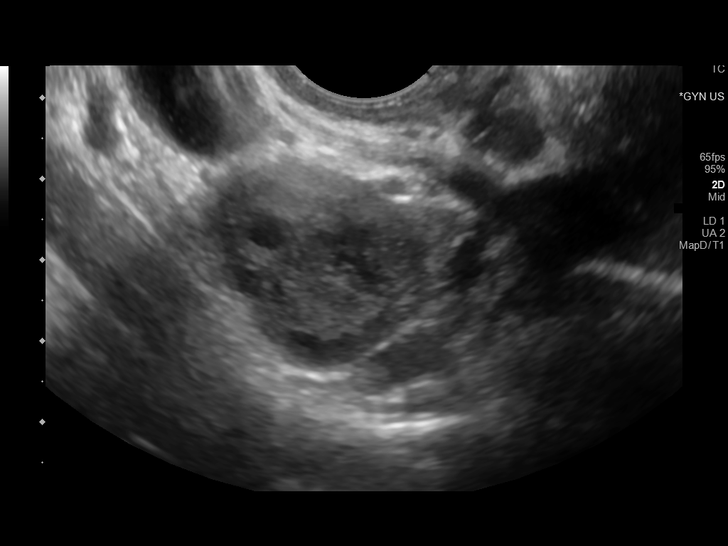
[im 44/59]
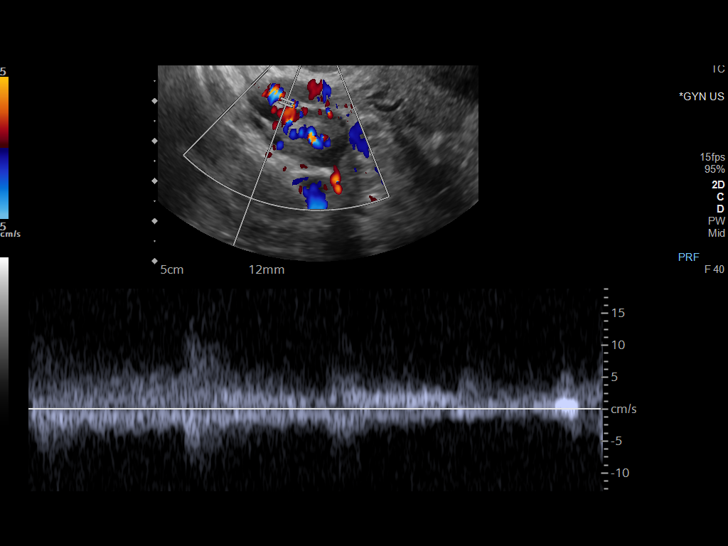
[im 49/59]
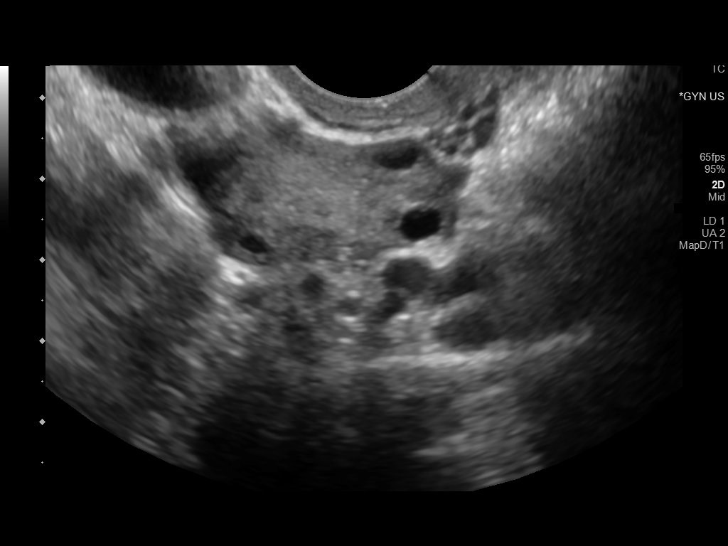
[im 54/59]
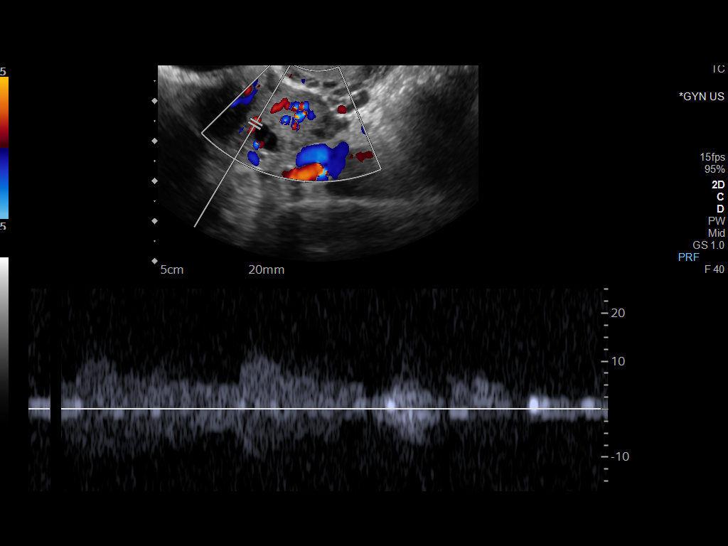
[im 59/59]
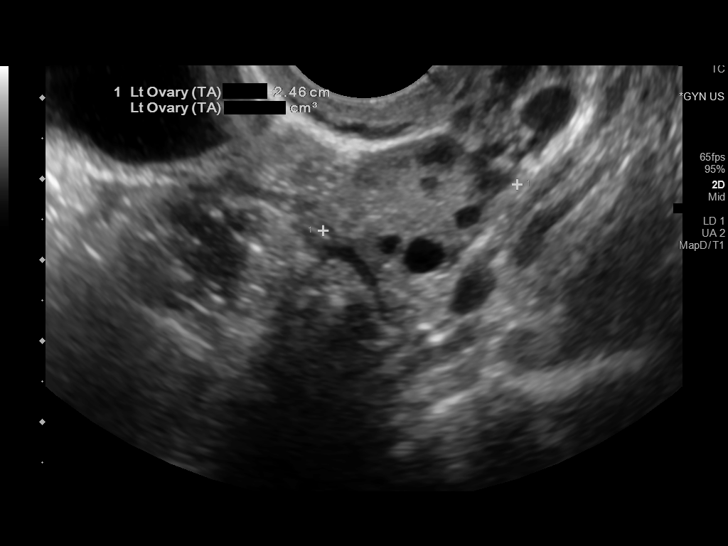

[13 of 25 positions shown; findings below may reference images not displayed]

FINDINGS: Uterus

Measurements: 8.5 x 3.5 x 4.9 cm = volume: 76.7 mL. No fibroids or
other mass visualized.

Endometrium

Thickness: 10 mm.  No focal abnormality visualized.

Right ovary

Measurements: 3.2 x 2.2 x 3.9 cm = volume: 14.2 mL. Normal
appearance/no adnexal mass.

Left ovary

Measurements: 3.6 x 1.6 x 2.5 cm = volume: 7.5 mL. Normal
appearance/no adnexal mass.

Pulsed Doppler evaluation of both ovaries demonstrates normal
low-resistance arterial and venous waveforms.

Other findings

No abnormal free fluid.
IMPRESSION: Normal pelvic ultrasound. No cause for the patient's symptoms
identified.

## 2019-03-12 MED ORDER — CEPHALEXIN 500 MG PO CAPS: 500.0000 mg | ORAL_CAPSULE | Freq: Two times a day (BID) | ORAL | 0 refills | Status: DC

## 2019-03-12 MED ORDER — METRONIDAZOLE 500 MG PO TABS
2000.0000 mg | ORAL_TABLET | Freq: Once | ORAL | Status: AC
  Administered 2019-03-12: 2000 mg via ORAL
  Filled 2019-03-12: qty 4

## 2019-03-12 NOTE — ED Notes (Signed)
Her u/s has just been completed. She thanks [place] for the drink and sandwich we got for her.

## 2019-03-12 NOTE — ED Triage Notes (Signed)
Pt reports that for about a month had burning with urination. Reports that thought she had yeast infection bc had itching so took OTC yeast pack and helped some but still having burning with urination and not feeling well.  Reports has rash and itching as well.

## 2019-03-12 NOTE — ED Provider Notes (Signed)
[facility] DEPT Provider Note   CSN: [id number] Arrival date & time: 03/12/19  7253    History   Chief Complaint Chief Complaint  Patient presents with  . Dysuria  . Rash    HPI Sonya Prince is a 28 y.o. female with history of bipolar affective disorder, PTSD who presents with a one-month history of dysuria.  Patient has had some associated vaginal discharge.  She thought that she probably had a yeast infection and was taking an over-the-counter medication for this and it got better for a little while, however it has not completely improved her dysuria.  She denies any frequency or urgency, abdominal pain, nausea, vomiting, back pain, fever, chest pain, shortness of breath.  Patient reports a little bit of an infrequent cough which she has had for a while.  Patient does have concern for STD exposure.  Patient also reports a mild itchy rash to her left shoulder.  She has been staying with her parents and does report a new soap and detergent.  She is wondering if this may be related.     HPI  Past Medical History:  Diagnosis Date  . Anxiety   . Meningitis   . PTSD (post-traumatic stress disorder)     Patient Active Problem List   Diagnosis Date Noted  . Bipolar affective disorder, currently manic, mild ([place]) 01/17/2019    Past Surgical History:  Procedure Laterality Date  . WISDOM TOOTH EXTRACTION       OB History   No obstetric history on file.      Home Medications    Prior to Admission medications   Medication Sig Start Date End Date Taking? Authorizing Provider  cephALEXin (KEFLEX) 500 MG capsule Take 1 capsule (500 mg total) by mouth 2 (two) times daily. 03/12/19   Frederica Kuster, PA-C    Family History No family history on file.  Social History Social History   Tobacco Use  . Smoking status: Never Smoker  . Smokeless tobacco: Never Used  Substance Use Topics  . Alcohol use: Yes    Comment: occassionally  . Drug  use: Yes    Types: Marijuana     Allergies   Doxycycline   Review of Systems Review of Systems  Constitutional: Negative for chills and fever.  HENT: Negative for facial swelling and sore throat.   Respiratory: Negative for shortness of breath.   Cardiovascular: Negative for chest pain.  Gastrointestinal: Negative for abdominal pain, nausea and vomiting.  Genitourinary: Positive for dysuria and vaginal discharge. Negative for flank pain, frequency, urgency and vaginal bleeding.  Musculoskeletal: Negative for back pain.  Skin: Positive for rash. Negative for wound.  Neurological: Negative for headaches.  Psychiatric/Behavioral: The patient is not nervous/anxious.      Physical Exam Updated Vital Signs BP 98/80 (BP Location: Left Arm)   Pulse 90   Temp 98 F (36.7 C) (Oral)   Resp 15   SpO2 100%   Physical Exam Vitals signs and nursing note reviewed.  Constitutional:      General: She is not in acute distress.    Appearance: She is well-developed. She is not diaphoretic.  HENT:     Head: Normocephalic and atraumatic.     Mouth/Throat:     Pharynx: No oropharyngeal exudate.  Eyes:     General: No scleral icterus.       Right eye: No discharge.        Left eye: No discharge.  Conjunctiva/sclera: Conjunctivae normal.     Pupils: Pupils are equal, round, and reactive to light.  Neck:     Musculoskeletal: Normal range of motion and neck supple.     Thyroid: No thyromegaly.  Cardiovascular:     Rate and Rhythm: Normal rate and regular rhythm.     Heart sounds: Normal heart sounds. No murmur. No friction rub. No gallop.   Pulmonary:     Effort: Pulmonary effort is normal. No respiratory distress.     Breath sounds: Normal breath sounds. No stridor. No wheezing or rales.  Abdominal:     General: Bowel sounds are normal. There is no distension.     Palpations: Abdomen is soft.     Tenderness: There is abdominal tenderness (Very mild right, suprapubic tenderness,  not tender on distracted palpation). There is no right CVA tenderness, left CVA tenderness, guarding or rebound.    Lymphadenopathy:     Cervical: No cervical adenopathy.  Skin:    General: Skin is warm and dry.     Coloration: Skin is not pale.     Findings: No rash.     Comments: Very faint, mild rash to the left shoulder; few erythematous papules, blanching, nontender  Neurological:     Mental Status: She is alert.     Coordination: Coordination normal.      ED Treatments / Results  Labs (all labs ordered are listed, but only abnormal results are displayed) Labs Reviewed  WET PREP, GENITAL - Abnormal; Notable for the following components:      Result Value   Trich, Wet Prep PRESENT (*)    WBC, Wet Prep HPF POC MODERATE (*)    All other components within normal limits  URINALYSIS, ROUTINE W REFLEX MICROSCOPIC - Abnormal; Notable for the following components:   APPearance HAZY (*)    Leukocytes,Ua SMALL (*)    Bacteria, UA RARE (*)    Trichomonas, UA PRESENT (*)    All other components within normal limits  URINE CULTURE  PREGNANCY, URINE  RPR  HIV ANTIBODY (ROUTINE TESTING W REFLEX)  GC/CHLAMYDIA PROBE AMP ([place]) NOT AT [facility]    EKG None  Radiology US Transvaginal Non-ob  Result Date: 03/12/2019 CLINICAL DATA:  Left lower quadrant abdominal pain for 1 month. History of pelvic inflammatory disease. EXAM: TRANSABDOMINAL AND TRANSVAGINAL ULTRASOUND OF PELVIS DOPPLER ULTRASOUND OF OVARIES TECHNIQUE: Both transabdominal and transvaginal ultrasound examinations of the pelvis were performed. Transabdominal technique was performed for global imaging of the pelvis including uterus, ovaries, adnexal regions, and pelvic cul-de-sac. It was necessary to proceed with endovaginal exam following the transabdominal exam to visualize the ovaries to better advantage. Color and duplex Doppler ultrasound was utilized to evaluate blood flow to the ovaries. COMPARISON:  Pelvic CT  10/10/2016 FINDINGS: Uterus Measurements: 8.5 x 3.5 x 4.9 cm = volume: 76.7 mL. No fibroids or other mass visualized. Endometrium Thickness: 10 mm.  No focal abnormality visualized. Right ovary Measurements: 3.2 x 2.2 x 3.9 cm = volume: 14.2 mL. Normal appearance/no adnexal mass. Left ovary Measurements: 3.6 x 1.6 x 2.5 cm = volume: 7.5 mL. Normal appearance/no adnexal mass. Pulsed Doppler evaluation of both ovaries demonstrates normal low-resistance arterial and venous waveforms. Other findings No abnormal free fluid. IMPRESSION: Normal pelvic ultrasound. No cause for the patient's symptoms identified. Electronically Signed   By: Richardean Sale M.D.   On: 03/12/2019 12:53   US Pelvis Complete  Result Date: 03/12/2019 CLINICAL DATA:  Left lower quadrant abdominal pain for  1 month. History of pelvic inflammatory disease. EXAM: TRANSABDOMINAL AND TRANSVAGINAL ULTRASOUND OF PELVIS DOPPLER ULTRASOUND OF OVARIES TECHNIQUE: Both transabdominal and transvaginal ultrasound examinations of the pelvis were performed. Transabdominal technique was performed for global imaging of the pelvis including uterus, ovaries, adnexal regions, and pelvic cul-de-sac. It was necessary to proceed with endovaginal exam following the transabdominal exam to visualize the ovaries to better advantage. Color and duplex Doppler ultrasound was utilized to evaluate blood flow to the ovaries. COMPARISON:  Pelvic CT 10/10/2016 FINDINGS: Uterus Measurements: 8.5 x 3.5 x 4.9 cm = volume: 76.7 mL. No fibroids or other mass visualized. Endometrium Thickness: 10 mm.  No focal abnormality visualized. Right ovary Measurements: 3.2 x 2.2 x 3.9 cm = volume: 14.2 mL. Normal appearance/no adnexal mass. Left ovary Measurements: 3.6 x 1.6 x 2.5 cm = volume: 7.5 mL. Normal appearance/no adnexal mass. Pulsed Doppler evaluation of both ovaries demonstrates normal low-resistance arterial and venous waveforms. Other findings No abnormal free fluid. IMPRESSION:  Normal pelvic ultrasound. No cause for the patient's symptoms identified. Electronically Signed   By: Richardean Sale M.D.   On: 03/12/2019 12:53   [place] Art/ven Flow Abd Pelv Doppler  Result Date: 03/12/2019 CLINICAL DATA:  Left lower quadrant abdominal pain for 1 month. History of pelvic inflammatory disease. EXAM: TRANSABDOMINAL AND TRANSVAGINAL ULTRASOUND OF PELVIS DOPPLER ULTRASOUND OF OVARIES TECHNIQUE: Both transabdominal and transvaginal ultrasound examinations of the pelvis were performed. Transabdominal technique was performed for global imaging of the pelvis including uterus, ovaries, adnexal regions, and pelvic cul-de-sac. It was necessary to proceed with endovaginal exam following the transabdominal exam to visualize the ovaries to better advantage. Color and duplex Doppler ultrasound was utilized to evaluate blood flow to the ovaries. COMPARISON:  Pelvic CT 10/10/2016 FINDINGS: Uterus Measurements: 8.5 x 3.5 x 4.9 cm = volume: 76.7 mL. No fibroids or other mass visualized. Endometrium Thickness: 10 mm.  No focal abnormality visualized. Right ovary Measurements: 3.2 x 2.2 x 3.9 cm = volume: 14.2 mL. Normal appearance/no adnexal mass. Left ovary Measurements: 3.6 x 1.6 x 2.5 cm = volume: 7.5 mL. Normal appearance/no adnexal mass. Pulsed Doppler evaluation of both ovaries demonstrates normal low-resistance arterial and venous waveforms. Other findings No abnormal free fluid. IMPRESSION: Normal pelvic ultrasound. No cause for the patient's symptoms identified. Electronically Signed   By: Richardean Sale M.D.   On: 03/12/2019 12:53    Procedures Procedures (including critical care time)  Medications Ordered in ED Medications  metroNIDAZOLE (FLAGYL) tablet 2,000 mg (2,000 mg Oral Given 03/12/19 1325)     Initial Impression / Assessment and Plan / ED Course  I have reviewed the triage vital signs and the nursing notes.  Pertinent labs & imaging results that were available during my care of the  patient were reviewed by me and considered in my medical decision making (see chart for details).        Patient presenting with a one-month history of dysuria and some intermittent vaginal discharge.  She has positive trichomonas on wet prep as well as the urine.  UA shows small leukocytes and 11-20 WBCs.  Will send for culture.  Urine pregnancy negative.  Will treat trichomonas with single dose Flagyl in the ED.  We will also discharge home with Keflex for UTI.  GC/chlamydia, HIV, RPR pending.  Considering left adnexal tenderness, pelvic ultrasound was conducted which was negative and showed no TOA.  Very minimal and inconsistent right lower quadrant tenderness and patient denies any abdominal pain prior to my exam.  Very low suspicion of intra-abdominal finding including appendicitis, no further imaging indicated today.  Return precautions discussed.  Patient understands and agrees with plan.  Patient vitals stable throughout ED course and discharged in satisfactory condition. I discussed patient case with Dr. Sherry Ruffing who guided the patient's management and agrees with plan.   Final Clinical Impressions(s) / ED Diagnoses   Final diagnoses:  Lower urinary tract infectious disease  Trichomoniasis of vagina    ED Discharge Orders         Ordered    cephALEXin (KEFLEX) 500 MG capsule  2 times daily     03/12/19 [address] [place], [place] 03/12/19 1408    Tegeler, Gwenyth Allegra, MD 03/12/19 1510

## 2019-03-12 NOTE — Discharge Instructions (Signed)
Take Keflex as prescribed until completed for your urinary tract infection. You have already been treated for Trichomonas in the ED today. You will be called in 3 days if any of your tests return positive. In that case, please make all of your sexual partners aware that they will need to be treated as well. Abstain from intercourse for one week until you have both been treated. Use condoms in the future to help prevent sexually transmitted disease and unwanted pregnancy. You can go to the health department in the future for free STD testing. Please return to the emergency department if you develop any new or worsening symptoms including severe, localized abdominal pain, fever, intractable vomiting, or any other concerning symptoms.

## 2019-03-13 LAB — URINE CULTURE: Culture: NO GROWTH

## 2019-03-13 LAB — RPR: RPR Ser Ql: NONREACTIVE

## 2019-03-13 LAB — HIV ANTIBODY (ROUTINE TESTING W REFLEX): HIV Screen 4th Generation wRfx: NONREACTIVE

## 2019-03-14 LAB — GC/CHLAMYDIA PROBE AMP (~~LOC~~) NOT AT ARMC
Chlamydia: NEGATIVE
Neisseria Gonorrhea: NEGATIVE

## 2019-03-28 ENCOUNTER — Emergency Department (HOSPITAL_COMMUNITY)
Admission: EM | Admit: 2019-03-28 | Discharge: 2019-03-28 | Disposition: A | Discharge status: Home / Self Care | Payer: Self-pay | Attending: Emergency Medicine | Admitting: Emergency Medicine

## 2019-03-28 ENCOUNTER — Other Ambulatory Visit: Payer: Self-pay

## 2019-03-28 ENCOUNTER — Encounter (HOSPITAL_COMMUNITY): Payer: Self-pay | Admitting: Family Medicine

## 2019-03-28 ENCOUNTER — Emergency Department (HOSPITAL_COMMUNITY): Payer: Self-pay

## 2019-03-28 ENCOUNTER — Emergency Department (HOSPITAL_COMMUNITY): Admission: EM | Admit: 2019-03-28 | Discharge: 2019-03-28 | Discharge status: Absent without leave | Payer: Self-pay

## 2019-03-28 DIAGNOSIS — M7662 Achilles tendinitis, left leg: Secondary | ICD-10-CM | POA: Insufficient documentation

## 2019-03-28 DIAGNOSIS — Z79899 Other long term (current) drug therapy: Secondary | ICD-10-CM | POA: Insufficient documentation

## 2019-03-28 IMAGING — DX LEFT ANKLE - 2 VIEW
2 series · 2 of 2 positions shown · non-contrast
Comparison: None.

CLINICAL DATA: Left ankle pain the past 2 days after running.
Possible injury in [REDACTED].

EXAM:
LEFT ANKLE - 2 VIEW

[ankle ap]
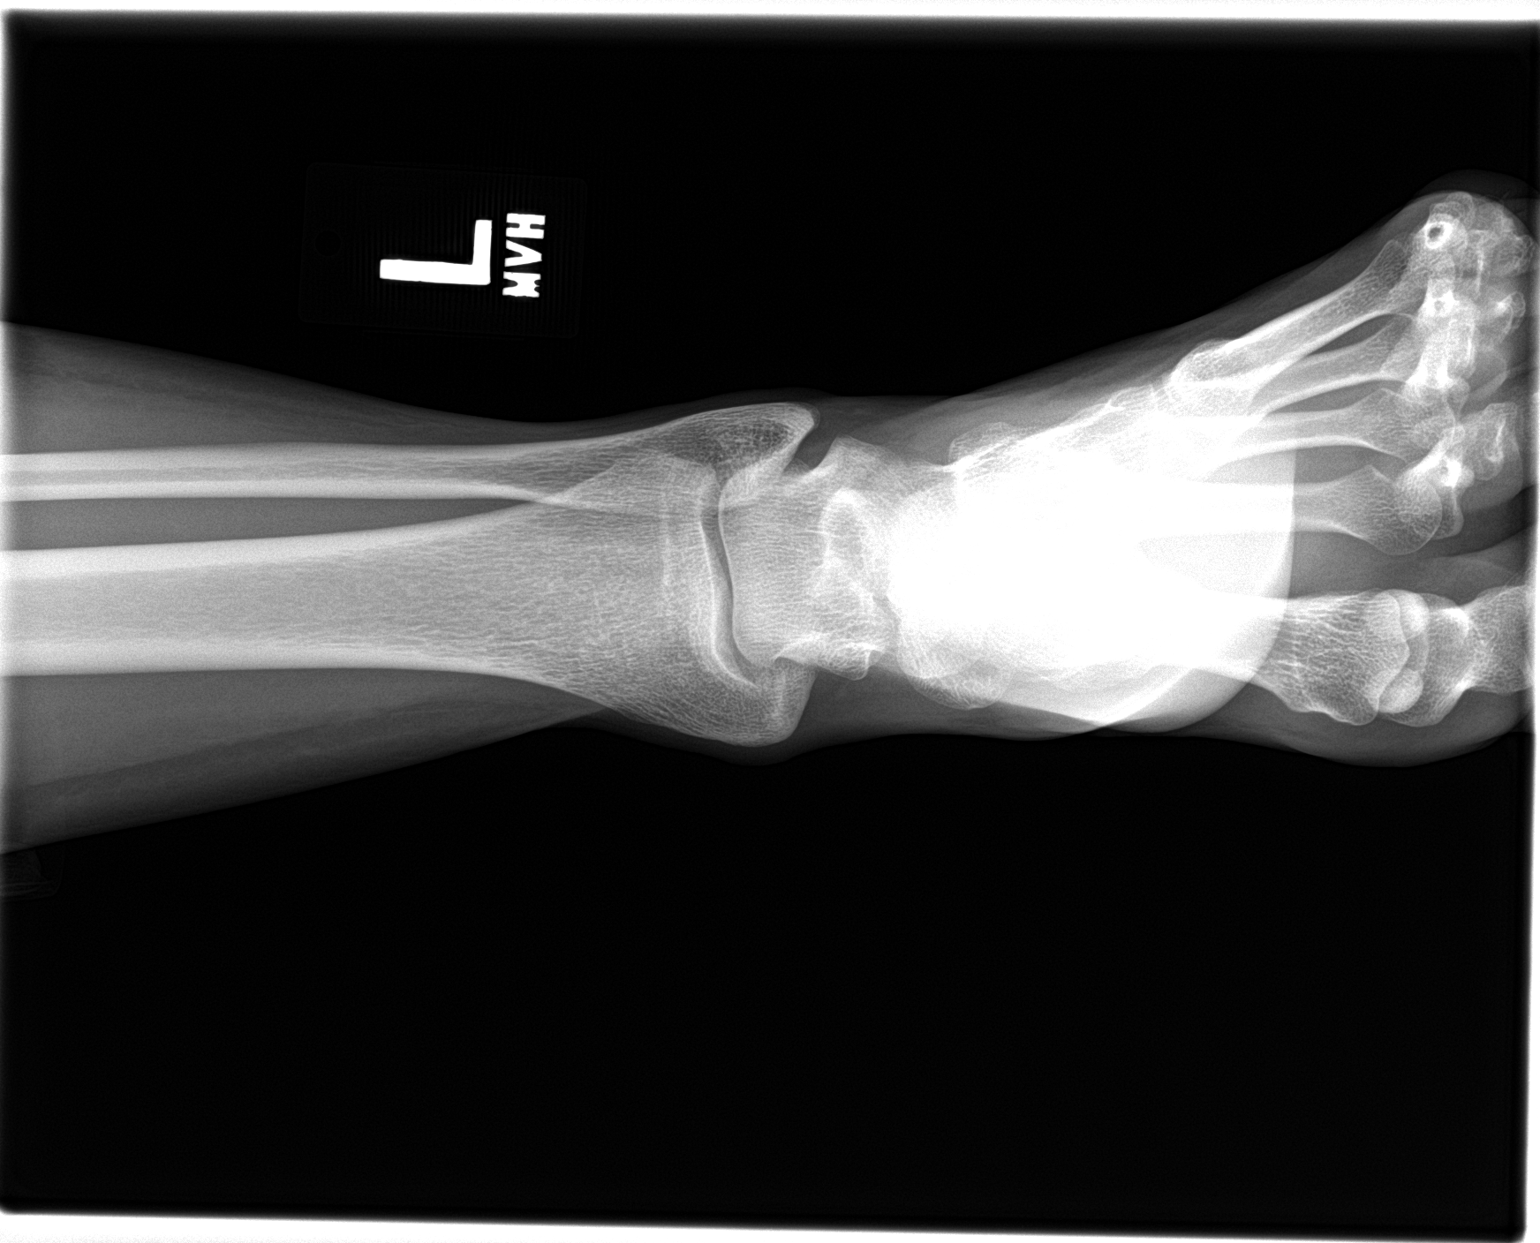

[ankle lat]
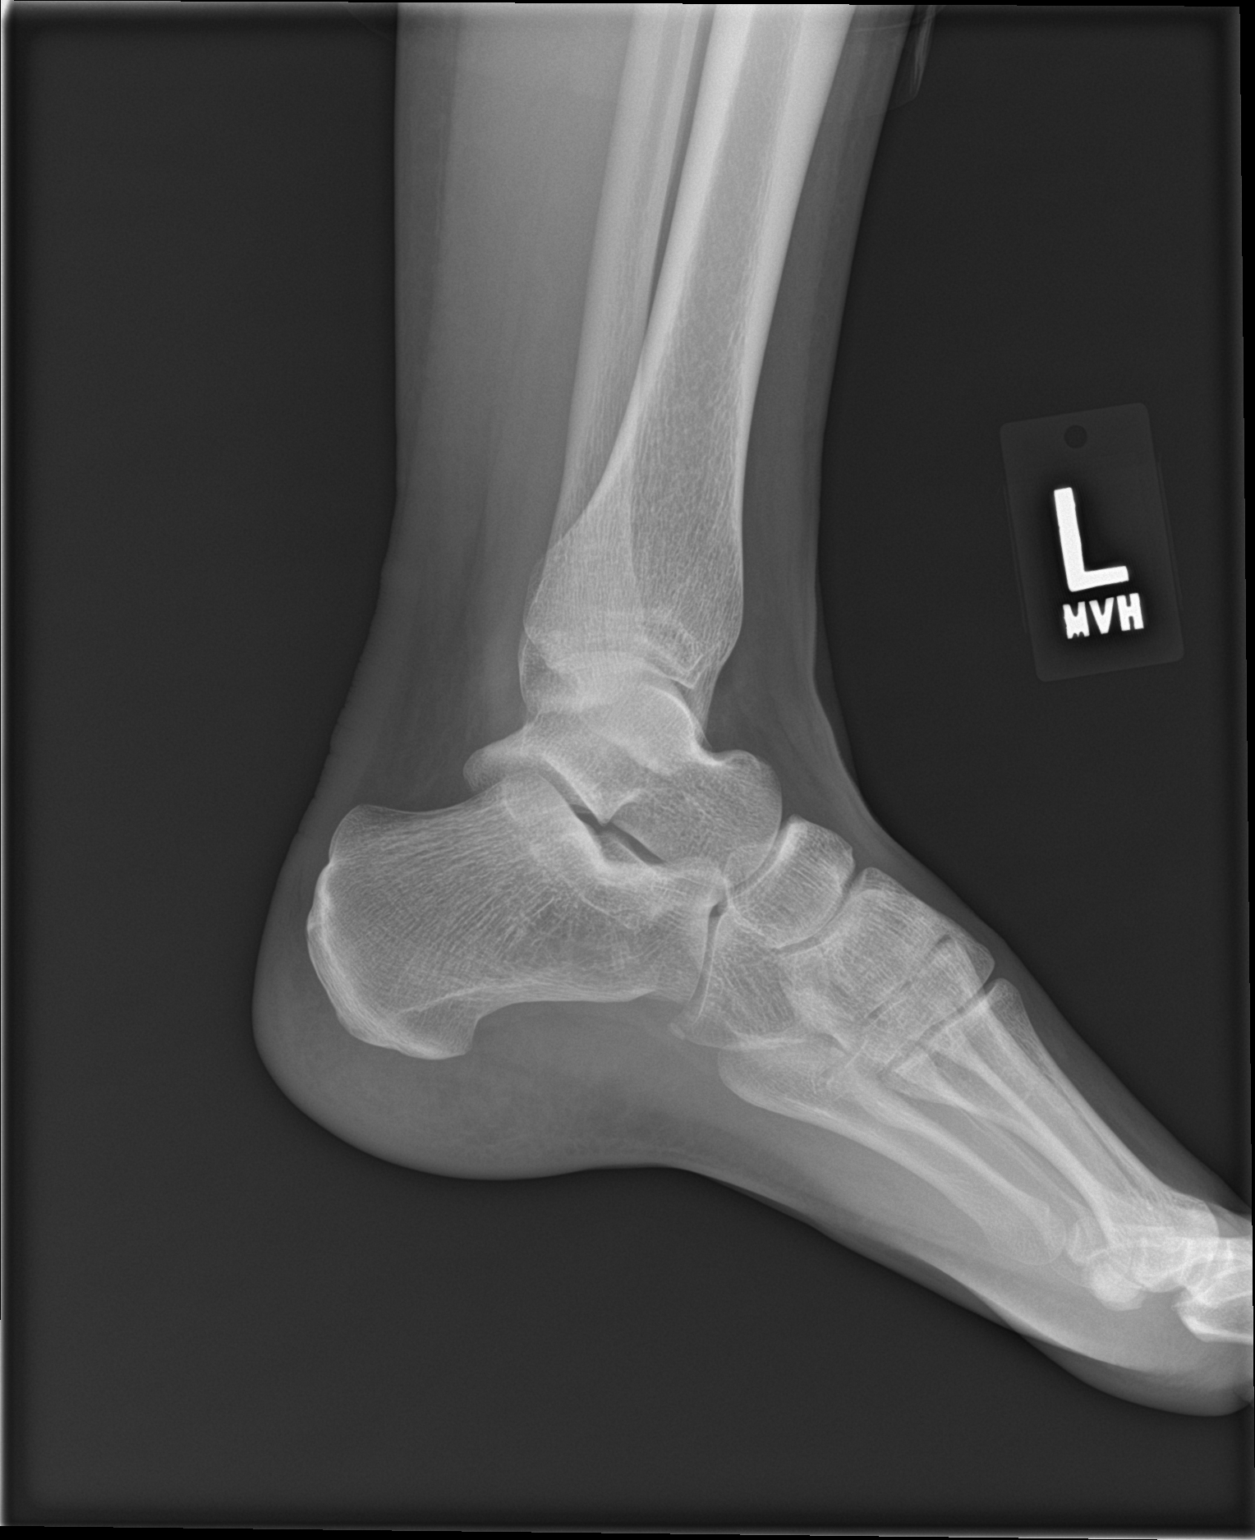

[2 of 2 positions shown; findings below may reference images not displayed]

FINDINGS: There is no evidence of fracture, dislocation, or joint effusion.
There is no evidence of arthropathy or other focal bone abnormality.
Soft tissues are unremarkable.
IMPRESSION: Negative.

## 2019-03-28 MED ORDER — IBUPROFEN 200 MG PO TABS
400.0000 mg | ORAL_TABLET | Freq: Once | ORAL | Status: AC
  Administered 2019-03-28: 400 mg via ORAL
  Filled 2019-03-28: qty 2

## 2019-03-28 NOTE — ED Provider Notes (Signed)
[facility] DEPT Provider Note   CSN: [id number] Arrival date & time: 03/28/19  1544    History   Chief Complaint Chief Complaint  Patient presents with  . Ankle Pain    HPI Sonya Prince is a 28 y.o. female.     28 year old female with prior medical history as detailed below presents for evaluation of left Achilles tendon pain.  Patient reports intermittent pain for the last 2 to 3 months.  She reports that the left Achilles tendon is sore.  This is worse after she runs.  She reports that she tried to run sequentially over the last several days.  After this exercise her left Achilles tendon is more uncomfortable.  She has taken Motrin at home and iced the tendon with minimal relief.  She is able to ambulate.  She is denying any specific inciting injury that she is aware of.  She denies associated fever.  She denies other complaint such as cough, shortness of breath, nausea, vomiting, or other acute complaint.  The history is provided by the patient and medical records.  Ankle Pain  Lower extremity pain location: Left heel and left Achilles tendon. Injury: no   Pain details:    Quality:  Aching   Radiates to:  Does not radiate   Severity:  Mild   Onset quality:  Gradual   Duration:  3 months   Timing:  Intermittent   Progression:  Waxing and waning Chronicity:  New Prior injury to area:  No Worsened by:  Exercise Ineffective treatments:  Ice   Past Medical History:  Diagnosis Date  . Anxiety   . Meningitis   . PTSD (post-traumatic stress disorder)     Patient Active Problem List   Diagnosis Date Noted  . Bipolar affective disorder, currently manic, mild ([place]) 01/17/2019    Past Surgical History:  Procedure Laterality Date  . WISDOM TOOTH EXTRACTION       OB History   No obstetric history on file.      Home Medications    Prior to Admission medications   Medication Sig Start Date End Date Taking? Authorizing Provider   cephALEXin (KEFLEX) 500 MG capsule Take 1 capsule (500 mg total) by mouth 2 (two) times daily. 03/12/19   Frederica Kuster, PA-C    Family History History reviewed. No pertinent family history.  Social History Social History   Tobacco Use  . Smoking status: Never Smoker  . Smokeless tobacco: Never Used  Substance Use Topics  . Alcohol use: Yes    Comment: 3 times a month   . Drug use: Yes    Types: Marijuana     Allergies   Doxycycline   Review of Systems Review of Systems  All other systems reviewed and are negative.    Physical Exam Updated Vital Signs BP 139/72 (BP Location: Left Arm)   Pulse 89   Temp 98.7 F (37.1 C) (Oral)   Resp 18   SpO2 100%   Physical Exam Vitals signs and nursing note reviewed.  Constitutional:      General: She is not in acute distress.    Appearance: Normal appearance. She is well-developed.  HENT:     Head: Normocephalic and atraumatic.  Eyes:     Conjunctiva/sclera: Conjunctivae normal.     Pupils: Pupils are equal, round, and reactive to light.  Neck:     Musculoskeletal: Normal range of motion and neck supple.  Cardiovascular:     Rate and  Rhythm: Normal rate and regular rhythm.     Heart sounds: Normal heart sounds.  Pulmonary:     Effort: Pulmonary effort is normal. No respiratory distress.     Breath sounds: Normal breath sounds.  Abdominal:     General: There is no distension.     Palpations: Abdomen is soft.     Tenderness: There is no abdominal tenderness.  Musculoskeletal: Normal range of motion.        General: No deformity.     Comments: Mild diffuse tenderness along the left Achilles tendon.  No palpable defect noted.  Patient with full active range of motion especially dorsiflexion and plantarflexion of the left foot.  Patient is ambulatory.  Skin:    General: Skin is warm and dry.  Neurological:     Mental Status: She is alert and oriented to person, place, and time.      ED Treatments / Results   Labs (all labs ordered are listed, but only abnormal results are displayed) Labs Reviewed - No data to display  EKG None  Radiology Dg Ankle 2 Views Left  Result Date: 03/28/2019 CLINICAL DATA:  Left ankle pain the past 2 days after running. Possible injury in January. EXAM: LEFT ANKLE - 2 VIEW COMPARISON:  None. FINDINGS: There is no evidence of fracture, dislocation, or joint effusion. There is no evidence of arthropathy or other focal bone abnormality. Soft tissues are unremarkable. IMPRESSION: Negative. Electronically Signed   By: Marin Olp M.D.   On: 03/28/2019 16:52    Procedures Procedures (including critical care time)  Medications Ordered in ED Medications  ibuprofen (ADVIL) tablet 400 mg (400 mg Oral Given 03/28/19 1645)     Initial Impression / Assessment and Plan / ED Course  I have reviewed the triage vital signs and the nursing notes.  Pertinent labs & imaging results that were available during my care of the patient were reviewed by me and considered in my medical decision making (see chart for details).       MDM  Screen complete  Sonya Prince was evaluated in Emergency Department on 03/28/2019 for the symptoms described in the history of present illness. She was evaluated in the context of the global COVID-19 pandemic, which necessitated consideration that the patient might be at risk for infection with the SARS-CoV-2 virus that causes COVID-19. Institutional protocols and algorithms that pertain to the evaluation of patients at risk for COVID-19 are in a state of rapid change based on information released by regulatory bodies including the CDC and federal and state organizations. These policies and algorithms were followed during the patient's care in the ED.   Patient is presenting for evaluation of left Achilles tendon pain.  Patient with likely tendinitis secondary to overuse.  Patient without evidence of fracture or tendon rupture on  exam.  Patient appears to be appropriate for discharge.  She does understand need for close follow-up.  Strict return precautions given and understood.  Patient does understand the plan for outpatient management and care of her complaint.  Final Clinical Impressions(s) / ED Diagnoses   Final diagnoses:  Achilles tendinitis of left lower extremity    ED Discharge Orders    None       Valarie Merino, MD 03/28/19 [phone]

## 2019-03-28 NOTE — ED Notes (Signed)
Radiology at bedside

## 2019-03-28 NOTE — Discharge Instructions (Addendum)
Please return for any problem. Follow up with a regular care provider as instructed.   Follow up with Orthopedics (Dr. Percell Miller) as instructed.  Continue to ICE, REST, and ELEVATE your ankle as instructed.

## 2019-03-28 NOTE — ED Triage Notes (Signed)
Patient is complaining of left ankle pain for the last two days after running. Patient states she injured her ankle in January without seeking any medical treatment. Patient states she took IBUPROFEN 400mg  yesterday, rest, and ice packs applied immediately. Patient is ambulatory, rode a bus here.

## 2019-08-10 ENCOUNTER — Telehealth: Payer: Self-pay | Admitting: Obstetrics & Gynecology

## 2019-08-10 NOTE — Telephone Encounter (Signed)
The patient called in to schedule an appointment due to bleeding for 3-4 weeks. She stated its not her normal period. Informed the patient of a financial application however she stated she will pay out of pocket and she did not want to fill out an application. Informed the patient of the 20.00 copay, the patient verbalized understanding.

## 2019-09-14 ENCOUNTER — Encounter: Payer: Self-pay | Admitting: Obstetrics and Gynecology

## 2019-09-19 ENCOUNTER — Encounter: Payer: Self-pay | Admitting: Obstetrics & Gynecology

## 2019-09-19 ENCOUNTER — Other Ambulatory Visit: Payer: Self-pay

## 2019-09-19 ENCOUNTER — Ambulatory Visit (INDEPENDENT_AMBULATORY_CARE_PROVIDER_SITE_OTHER): Payer: Self-pay | Admitting: Obstetrics & Gynecology

## 2019-09-19 VITALS — BP 103/59 | HR 86 | Wt 106.0 lb

## 2019-09-19 DIAGNOSIS — N898 Other specified noninflammatory disorders of vagina: Secondary | ICD-10-CM

## 2019-09-19 DIAGNOSIS — B373 Candidiasis of vulva and vagina: Secondary | ICD-10-CM

## 2019-09-19 DIAGNOSIS — Z113 Encounter for screening for infections with a predominantly sexual mode of transmission: Secondary | ICD-10-CM

## 2019-09-19 DIAGNOSIS — B9689 Other specified bacterial agents as the cause of diseases classified elsewhere: Secondary | ICD-10-CM

## 2019-09-19 DIAGNOSIS — A5901 Trichomonal vulvovaginitis: Secondary | ICD-10-CM

## 2019-09-19 DIAGNOSIS — N939 Abnormal uterine and vaginal bleeding, unspecified: Secondary | ICD-10-CM

## 2019-09-19 DIAGNOSIS — B3731 Acute candidiasis of vulva and vagina: Secondary | ICD-10-CM

## 2019-09-19 DIAGNOSIS — R102 Pelvic and perineal pain: Secondary | ICD-10-CM

## 2019-09-19 DIAGNOSIS — N760 Acute vaginitis: Secondary | ICD-10-CM

## 2019-09-19 NOTE — Patient Instructions (Signed)
Return to clinic for any scheduled appointments or for any gynecologic concerns as needed.   

## 2019-09-19 NOTE — Progress Notes (Signed)
GYNECOLOGY OFFICE VISIT NOTE  History:   Sonya Prince is a 28 y.o. G0 here today for evaluation of abnormal bleeding, vaginal discharge and pain.  Wants pelvic exam to check that she does not "have any scar in her vagina or tear in vagina or cervix".  Also reports yellow discharge and mild pain in lower abdomen. No fevers, N/V.  She denies any other concerns.    Past Medical History:  Diagnosis Date  . Anxiety   . Meningitis   . PTSD (post-traumatic stress disorder)     Past Surgical History:  Procedure Laterality Date  . WISDOM TOOTH EXTRACTION      The following portions of the patient's history were reviewed and updated as appropriate: allergies, current medications, past family history, past medical history, past social history, past surgical history and problem list.   Health Maintenance:  Normal pap many years ago.   Review of Systems:  Pertinent items noted in HPI and remainder of comprehensive ROS otherwise negative.  Physical Exam:  BP (!) 103/59   Pulse 86   Wt 106 lb (48.1 kg)   LMP 09/13/2019 (Approximate)   BMI 19.39 kg/m  CONSTITUTIONAL: Well-developed, well-nourished female in no acute distress.  HEENT:  Normocephalic, atraumatic. External right and left ear normal. No scleral icterus.  NECK: Normal range of motion, supple, no masses noted on observation SKIN: No rash noted. Not diaphoretic. No erythema. No pallor. MUSCULOSKELETAL: Normal range of motion. No edema noted. NEUROLOGIC: Alert and oriented to person, place, and time. Normal muscle tone coordination. No cranial nerve deficit noted. PSYCHIATRIC: Normal mood and affect. Normal behavior. Normal judgment and thought content. CARDIOVASCULAR: Normal heart rate noted RESPIRATORY: Effort and breath sounds normal, no problems with respiration noted ABDOMEN: No masses noted. No other overt distention noted.   PELVIC: Normal appearing external genitalia; normal appearing vaginal mucosa and cervix. No  lacerations seen. Yellow, bloody discharge noted, testing sample obtained.  Mild tenderness during speculum exam, no CMT.  Normal uterine size, no other palpable masses, no uterine or adnexal tenderness.  Labs and Imaging 03/12/2019 TRANSABDOMINAL AND TRANSVAGINAL ULTRASOUND OF PELVIS  CLINICAL DATA:  Left lower quadrant abdominal pain for 1 month. History of pelvic inflammatory disease.   DOPPLER ULTRASOUND OF OVARIES  TECHNIQUE: Both transabdominal and transvaginal ultrasound examinations of the pelvis were performed. Transabdominal technique was performed for global imaging of the pelvis including uterus, ovaries, adnexal regions, and pelvic cul-de-sac.  It was necessary to proceed with endovaginal exam following the transabdominal exam to visualize the ovaries to better advantage. Color and duplex Doppler ultrasound was utilized to evaluate blood flow to the ovaries.  COMPARISON:  Pelvic CT 10/10/2016  FINDINGS: Uterus  Measurements: 8.5 x 3.5 x 4.9 cm = volume: 76.7 mL. No fibroids or other mass visualized.  Endometrium  Thickness: 10 mm.  No focal abnormality visualized.  Right ovary  Measurements: 3.2 x 2.2 x 3.9 cm = volume: 14.2 mL. Normal appearance/no adnexal mass.  Left ovary  Measurements: 3.6 x 1.6 x 2.5 cm = volume: 7.5 mL. Normal appearance/no adnexal mass.  Pulsed Doppler evaluation of both ovaries demonstrates normal low-resistance arterial and venous waveforms.  Other findings  No abnormal free fluid.  IMPRESSION: Normal pelvic ultrasound. No cause for the patient's symptoms identified.   Electronically Signed   By: Richardean Sale M.D.   On: 03/12/2019 12:53       Assessment and Plan:    1. Pelvic pain in female Normal ultrasound findings reviewed  with patient. Will follow up cervicovaginal results and manage accordingly Offered trial of OCPs for AUB, she declined this for now "I don't want any birth control". -  Cervicovaginal ancillary only  Routine preventative health maintenance measures emphasized. Please refer to After Visit Summary for other counseling recommendations.   No follow-ups on file.    Total face-to-face time with patient: 20 minutes.  Over 50% of encounter was spent on counseling and coordination of care.   Verita Schneiders, MD, [place] for [employer], [place]

## 2019-09-23 ENCOUNTER — Telehealth: Payer: Self-pay

## 2019-09-23 LAB — CERVICOVAGINAL ANCILLARY ONLY
Bacterial Vaginitis (gardnerella): POSITIVE — AB
Candida Glabrata: NEGATIVE
Candida Vaginitis: POSITIVE — AB
Chlamydia: NEGATIVE
Comment: NEGATIVE
Comment: NEGATIVE
Comment: NEGATIVE
Comment: NEGATIVE
Neisseria Gonorrhea: NEGATIVE
Trichomonas: POSITIVE — AB

## 2019-09-23 MED ORDER — FLUCONAZOLE 150 MG PO TABS: 150.0000 mg | ORAL_TABLET | Freq: Once | ORAL | 3 refills | Status: AC

## 2019-09-23 MED ORDER — METRONIDAZOLE 500 MG PO TABS: 500.0000 mg | ORAL_TABLET | Freq: Two times a day (BID) | ORAL | 2 refills | Status: DC

## 2019-09-23 NOTE — Progress Notes (Signed)
Result Addendum  Vaginal discharge test is abnormal and showed trichomonal, bacterial and candidal vaginitis. These can all cause pelvic pain. Metronidazole and Diflucan was prescribed.  Recommend testing for other STIs, also needs to let partner(s) know so the partner(s) can get testing and treatment. Patient and sex partner(s) should abstain from unprotected sexual activity for seven days after everyone receives appropriate treatment.  Patient will need to return in about 4 weeks after treatment for repeat test of cure.  Please call to inform patient of results and recommendations, and advise to pick up prescription.   Verita Schneiders, MD

## 2019-09-23 NOTE — Telephone Encounter (Signed)
-----   Message from Osborne Oman, MD sent at 09/23/2019 11:27 AM EDT ----- Vaginal discharge test is abnormal and showed trichomonal, bacterial and candidal vaginitis. These can all cause pelvic pain. Metronidazole and Diflucan was prescribed.  Recommend testing for other STIs, also needs to let partner(s) know so the partner(s) can get testing and treatment. Patient and sex partner(s) should abstain from unprotected sexual activity for seven days after everyone receives appropriate treatment.  Patient will need to return in about 4 weeks after treatment for repeat test of cure.  Please call to inform patient of results and recommendations, and advise to pick up prescription.   Verita Schneiders, MD

## 2019-09-23 NOTE — Telephone Encounter (Signed)
Attempted to contact pt regarding test results from 09/19/19; pt tested positive for trichomonas, yeast, and BV. Rx sent by Harolyn Rutherford, MD to pt's pharmacy. Error message stating call could not be completed at this time.

## 2019-09-23 NOTE — Addendum Note (Signed)
Addended by: Verita Schneiders A on: 09/23/2019 11:27 AM   Modules accepted: Orders

## 2019-09-27 NOTE — Telephone Encounter (Signed)
Attempted to contact pt and was unable to LM due to VM box not set up.  MyChart message sent and letter sent.

## 2019-12-17 ENCOUNTER — Emergency Department (HOSPITAL_COMMUNITY): Payer: Medicaid Other

## 2019-12-17 ENCOUNTER — Other Ambulatory Visit: Payer: Self-pay

## 2019-12-17 ENCOUNTER — Emergency Department (HOSPITAL_COMMUNITY)
Admission: EM | Admit: 2019-12-17 | Discharge: 2019-12-17 | Disposition: A | Discharge status: Home / Self Care | Payer: Medicaid Other | Attending: Emergency Medicine | Admitting: Emergency Medicine

## 2019-12-17 ENCOUNTER — Encounter (HOSPITAL_COMMUNITY): Payer: Self-pay | Admitting: Emergency Medicine

## 2019-12-17 DIAGNOSIS — Y9389 Activity, other specified: Secondary | ICD-10-CM | POA: Insufficient documentation

## 2019-12-17 DIAGNOSIS — Z20822 Contact with and (suspected) exposure to covid-19: Secondary | ICD-10-CM | POA: Diagnosis not present

## 2019-12-17 DIAGNOSIS — S62306A Unspecified fracture of fifth metacarpal bone, right hand, initial encounter for closed fracture: Secondary | ICD-10-CM

## 2019-12-17 DIAGNOSIS — Z79899 Other long term (current) drug therapy: Secondary | ICD-10-CM | POA: Diagnosis not present

## 2019-12-17 DIAGNOSIS — Y929 Unspecified place or not applicable: Secondary | ICD-10-CM | POA: Diagnosis not present

## 2019-12-17 DIAGNOSIS — Y999 Unspecified external cause status: Secondary | ICD-10-CM | POA: Diagnosis not present

## 2019-12-17 DIAGNOSIS — W51XXXA Accidental striking against or bumped into by another person, initial encounter: Secondary | ICD-10-CM | POA: Diagnosis not present

## 2019-12-17 DIAGNOSIS — S6991XA Unspecified injury of right wrist, hand and finger(s), initial encounter: Secondary | ICD-10-CM | POA: Diagnosis present

## 2019-12-17 IMAGING — CR DG HAND COMPLETE 3+V*R*
3 series · 3 of 3 positions shown · non-contrast
Comparison: None.

CLINICAL DATA: Right hand pain after injury.

EXAM:
RIGHT HAND - COMPLETE 3+ VIEW

[x hand pa right]
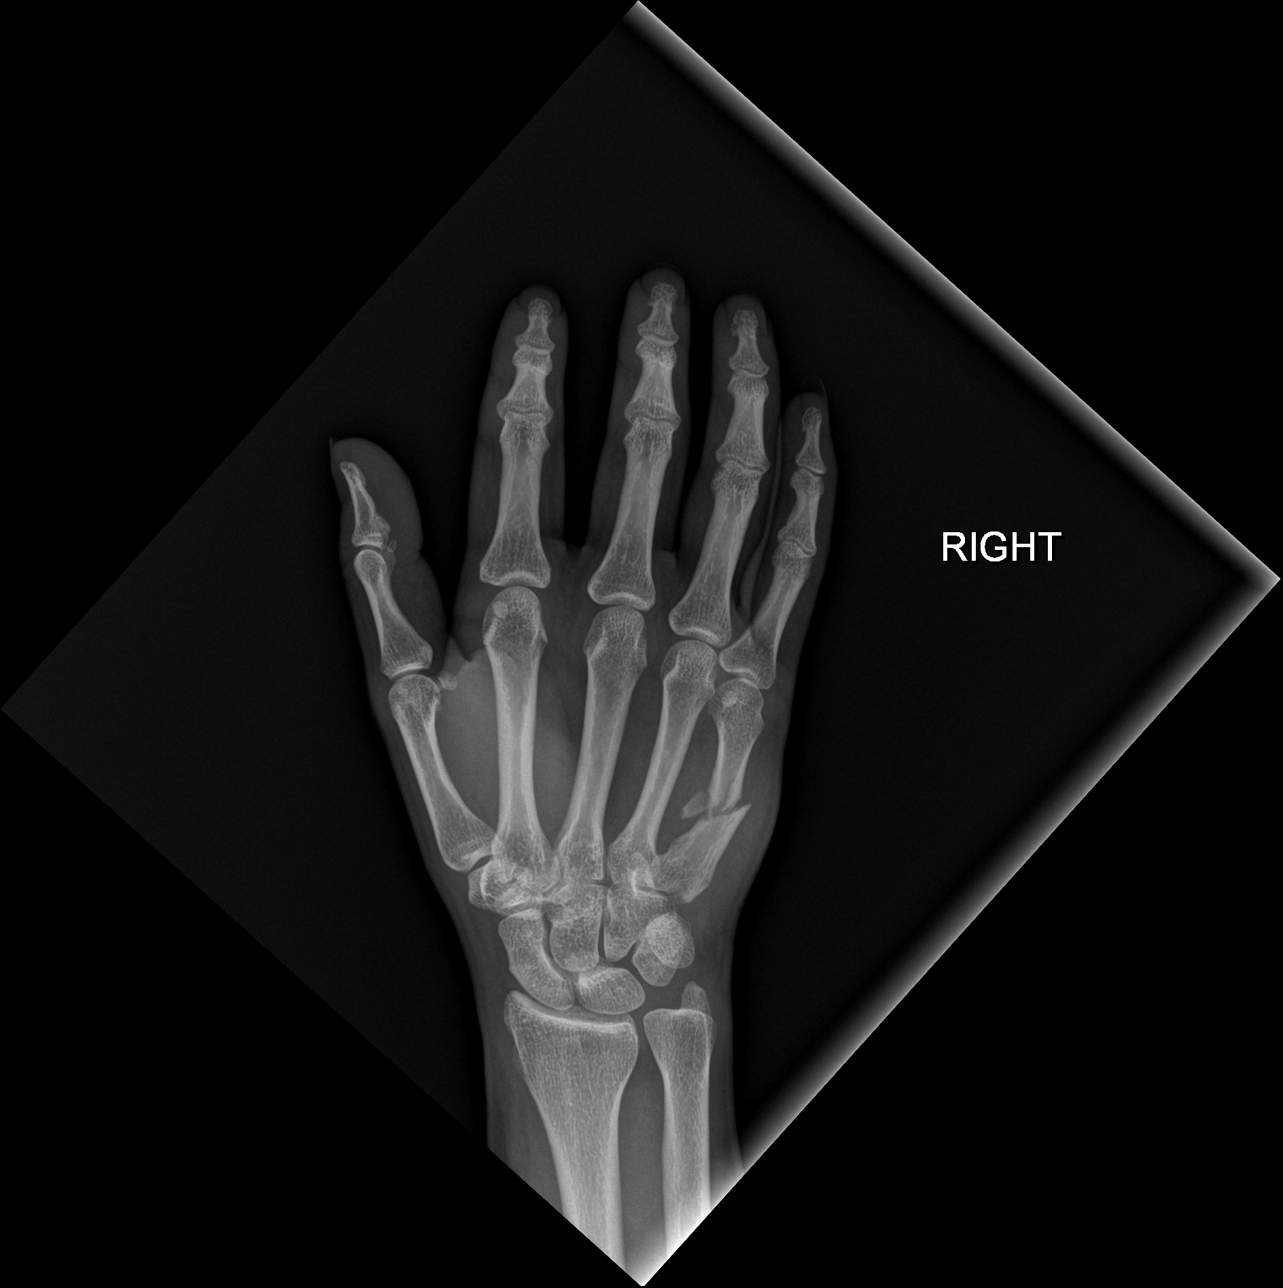

[x hand obl right]
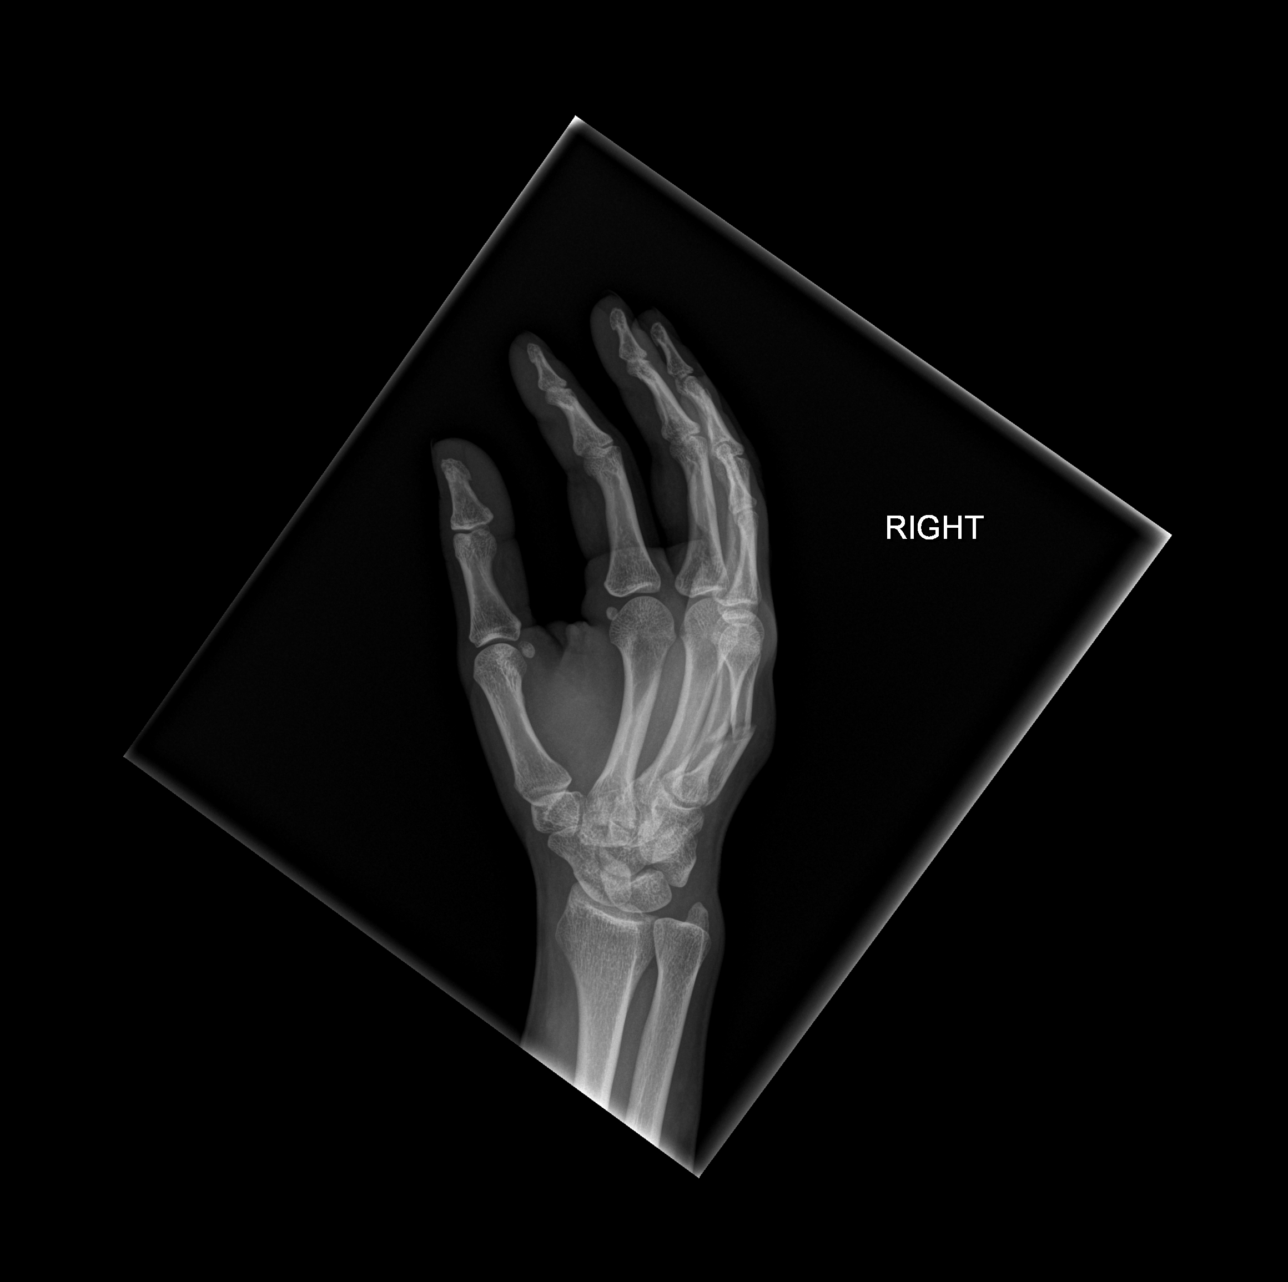

[x hand lat right]
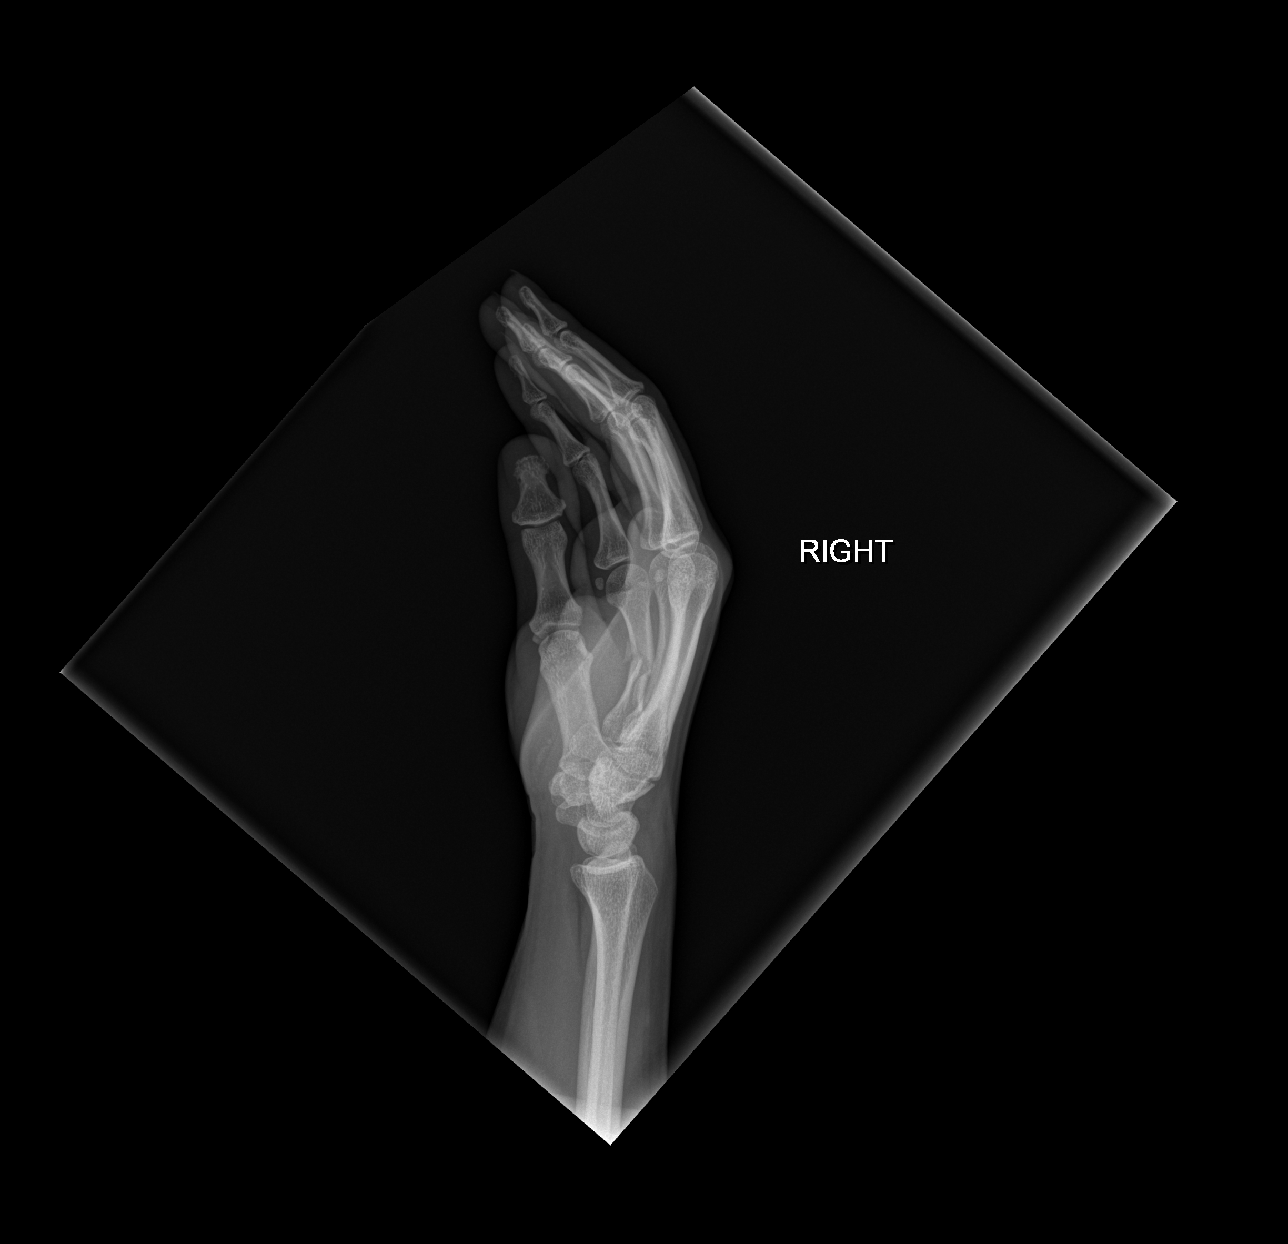

[3 of 3 positions shown; findings below may reference images not displayed]

FINDINGS: Moderately displaced and comminuted fracture is seen involving the
fifth metacarpal. No other bony abnormality is noted. Joint spaces
are intact. No soft tissue abnormality is noted
IMPRESSION: Moderately displaced and comminuted fifth metacarpal fracture.

## 2019-12-17 MED ORDER — IBUPROFEN 800 MG PO TABS
800.0000 mg | ORAL_TABLET | Freq: Once | ORAL | Status: AC
  Administered 2019-12-17: 800 mg via ORAL
  Filled 2019-12-17: qty 1

## 2019-12-17 MED ORDER — IBUPROFEN 200 MG PO TABS: 600.0000 mg | ORAL_TABLET | Freq: Four times a day (QID) | ORAL | Status: DC

## 2019-12-17 MED ORDER — OXYCODONE HCL 5 MG PO TABS: 5.0000 mg | ORAL_TABLET | Freq: Four times a day (QID) | ORAL | 0 refills | Status: DC | PRN

## 2019-12-17 MED ORDER — HYDROCODONE-ACETAMINOPHEN 5-325 MG PO TABS
1.0000 | ORAL_TABLET | Freq: Once | ORAL | Status: AC
  Administered 2019-12-17: 1 via ORAL
  Filled 2019-12-17: qty 1

## 2019-12-17 MED ORDER — ACETAMINOPHEN 325 MG PO TABS: 650.0000 mg | ORAL_TABLET | Freq: Four times a day (QID) | ORAL | Status: DC
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
## 2019-12-17 NOTE — Discharge Instructions (Addendum)
Keep your splint on.  Keep the splint clean and dry. Use Tylenol and ibuprofen regularly for pain, feeling intermittently as may be needed with oxycodone.  Most importantly, remain isolated at home between now and your surgery on Monday.  Do not eat or drink anything after midnight Sunday night.  Arrive at the [facility] surgery center at [address] by 7 AM on Monday.  Someone will need to take you to the surgery center, remain, and take you home.  If you fail to comply with all of these instructions, it is possible that your operation could be canceled.
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
## 2019-12-17 NOTE — ED Notes (Signed)
Discharge instructions reviewed with pt, including information regarding surgery Monday and how to prepare for surgery.  Pt verbalized understanding, ambulatory at discharge.
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
## 2019-12-18 LAB — SARS CORONAVIRUS 2 (TAT 6-24 HRS): SARS Coronavirus 2: NEGATIVE

## 2019-12-19 ENCOUNTER — Ambulatory Visit (HOSPITAL_BASED_OUTPATIENT_CLINIC_OR_DEPARTMENT_OTHER)
Admission: RE | Admit: 2019-12-19 | Discharge: 2019-12-19 | Disposition: A | Discharge status: Home / Self Care | Payer: Medicaid Other | Source: Ambulatory Visit | Attending: Orthopedic Surgery | Admitting: Orthopedic Surgery

## 2019-12-19 ENCOUNTER — Ambulatory Visit (HOSPITAL_BASED_OUTPATIENT_CLINIC_OR_DEPARTMENT_OTHER): Payer: Medicaid Other | Admitting: Anesthesiology

## 2019-12-19 ENCOUNTER — Other Ambulatory Visit: Payer: Self-pay

## 2019-12-19 ENCOUNTER — Encounter (HOSPITAL_BASED_OUTPATIENT_CLINIC_OR_DEPARTMENT_OTHER): Payer: Self-pay | Admitting: Orthopedic Surgery

## 2019-12-19 ENCOUNTER — Ambulatory Visit (HOSPITAL_COMMUNITY): Payer: Medicaid Other

## 2019-12-19 ENCOUNTER — Encounter (HOSPITAL_BASED_OUTPATIENT_CLINIC_OR_DEPARTMENT_OTHER)
Admission: RE | Disposition: A | Discharge status: Home / Self Care | Payer: Self-pay | Source: Ambulatory Visit | Attending: Orthopedic Surgery

## 2019-12-19 DIAGNOSIS — F4310 Post-traumatic stress disorder, unspecified: Secondary | ICD-10-CM | POA: Insufficient documentation

## 2019-12-19 DIAGNOSIS — Z881 Allergy status to other antibiotic agents status: Secondary | ICD-10-CM | POA: Diagnosis not present

## 2019-12-19 DIAGNOSIS — Z419 Encounter for procedure for purposes other than remedying health state, unspecified: Secondary | ICD-10-CM

## 2019-12-19 DIAGNOSIS — S62326A Displaced fracture of shaft of fifth metacarpal bone, right hand, initial encounter for closed fracture: Secondary | ICD-10-CM | POA: Diagnosis not present

## 2019-12-19 DIAGNOSIS — F319 Bipolar disorder, unspecified: Secondary | ICD-10-CM | POA: Diagnosis not present

## 2019-12-19 HISTORY — PX: OPEN REDUCTION INTERNAL FIXATION (ORIF) METACARPAL: SHX6234

## 2019-12-19 LAB — POCT PREGNANCY, URINE: Preg Test, Ur: NEGATIVE

## 2019-12-19 IMAGING — RF DG HAND COMPLETE 3+V*R*
1 series · 3 of 3 positions shown · non-contrast
Comparison: [DATE]

CLINICAL DATA: Repair of fifth metacarpal fracture.

FLUOROSCOPY TIME:  13 seconds.
Images: 3
EXAM:
RIGHT HAND - COMPLETE 3+ VIEW; DG C-ARM 1-60 MIN

[Series 1: run · 3 of 3 slices shown]
[im 1/3]
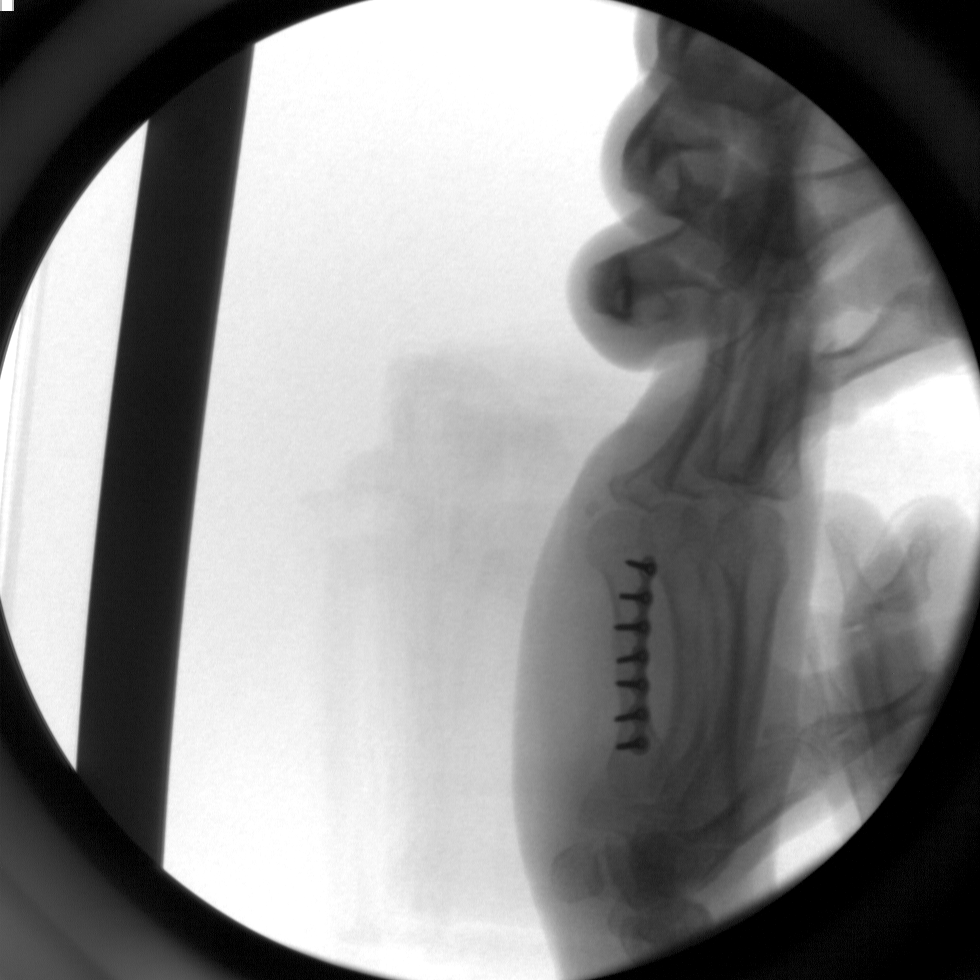
[im 2/3]
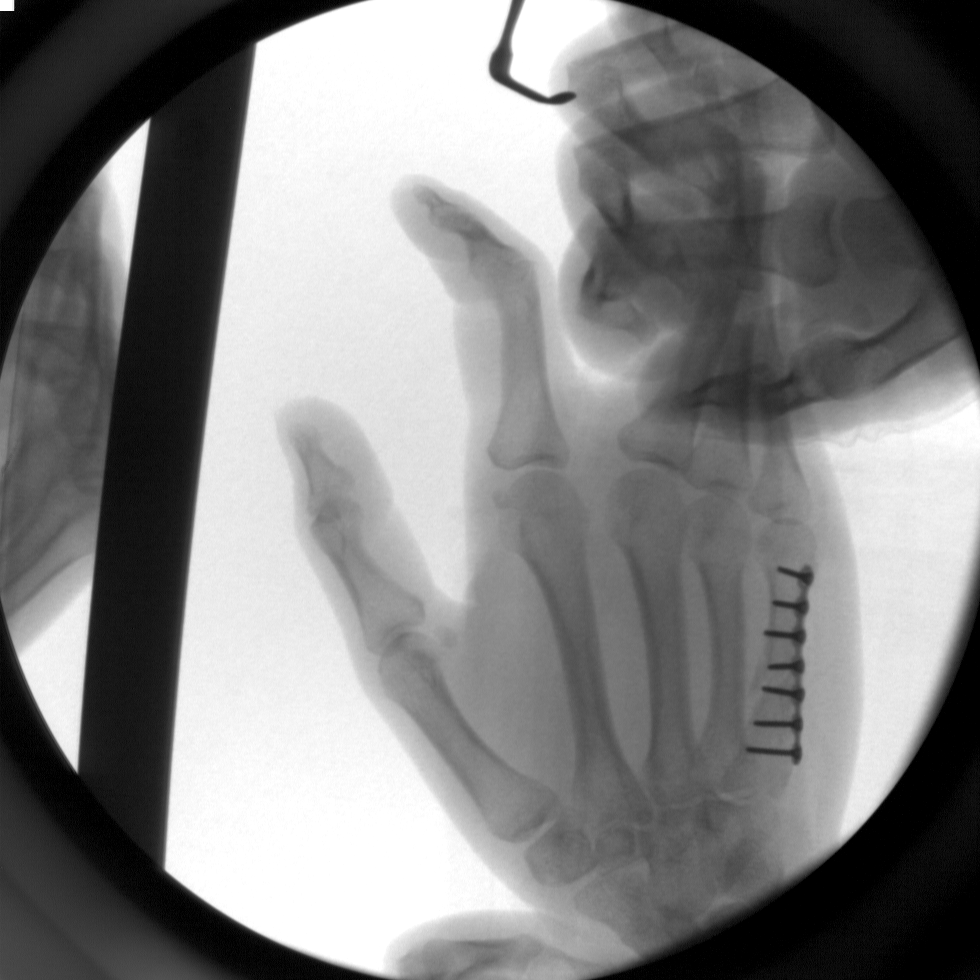
[im 3/3]
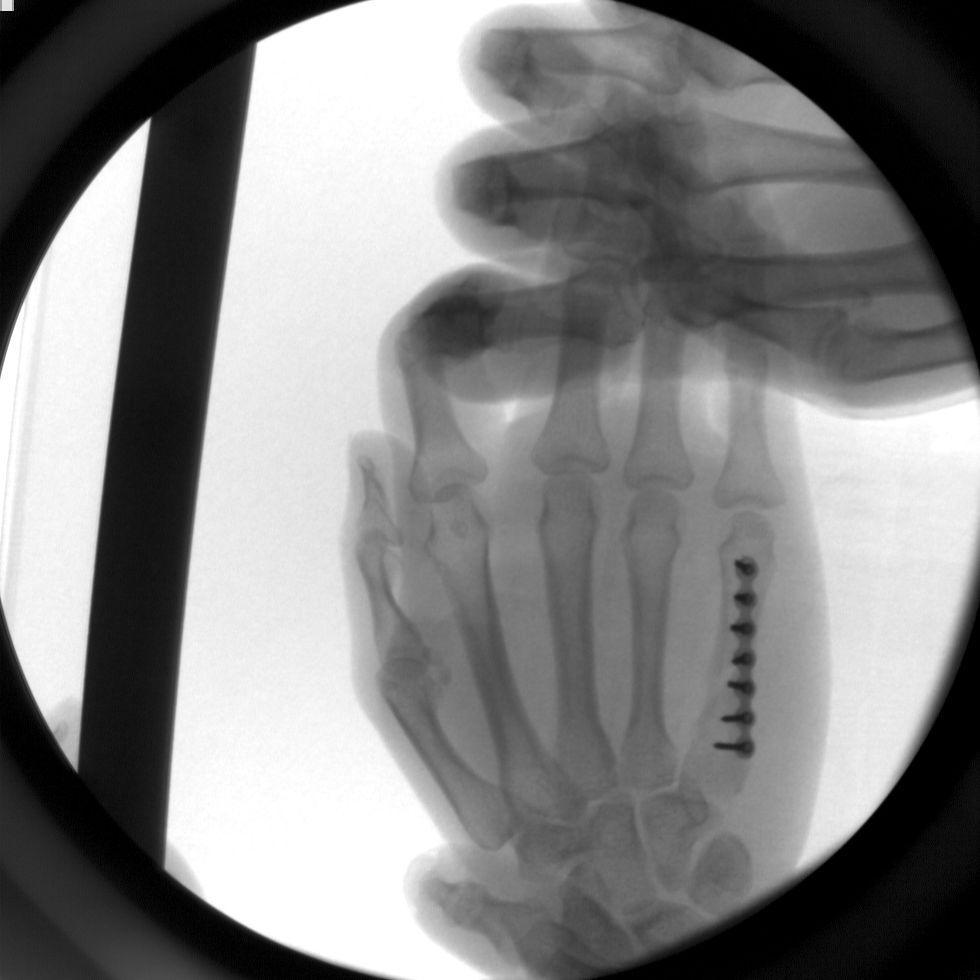

[3 of 3 positions shown; findings below may reference images not displayed]

FINDINGS: A plate has been affixed to the fifth metacarpal, crossing the site
of fracture, with numerous screws. Hardware is in good position.
Improved alignment.
IMPRESSION: Fifth metacarpal fracture repair as above.

## 2019-12-19 IMAGING — RF DG C-ARM 1-60 MIN
1 series · 3 of 3 positions shown · non-contrast
Comparison: [DATE]

CLINICAL DATA: Repair of fifth metacarpal fracture.

FLUOROSCOPY TIME:  13 seconds.
Images: 3
EXAM:
RIGHT HAND - COMPLETE 3+ VIEW; DG C-ARM 1-60 MIN

[Series 1: run · 3 of 3 slices shown]
[im 1/3]
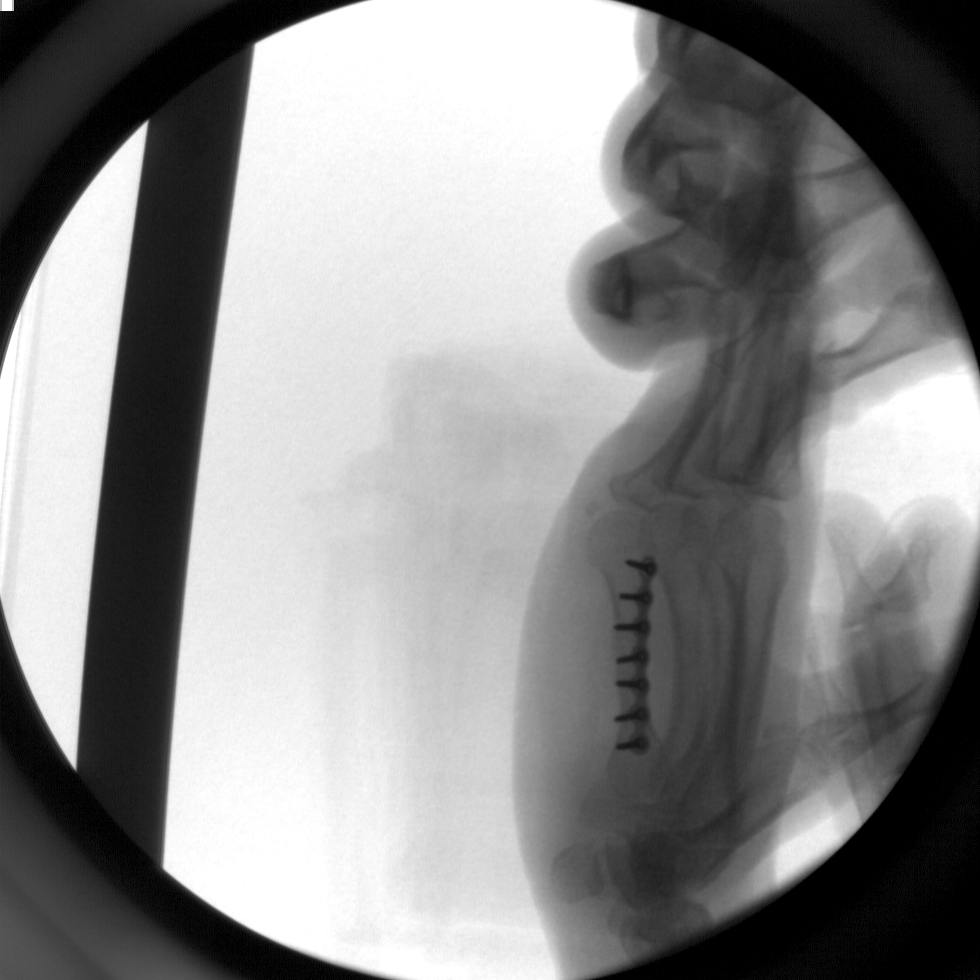
[im 2/3]
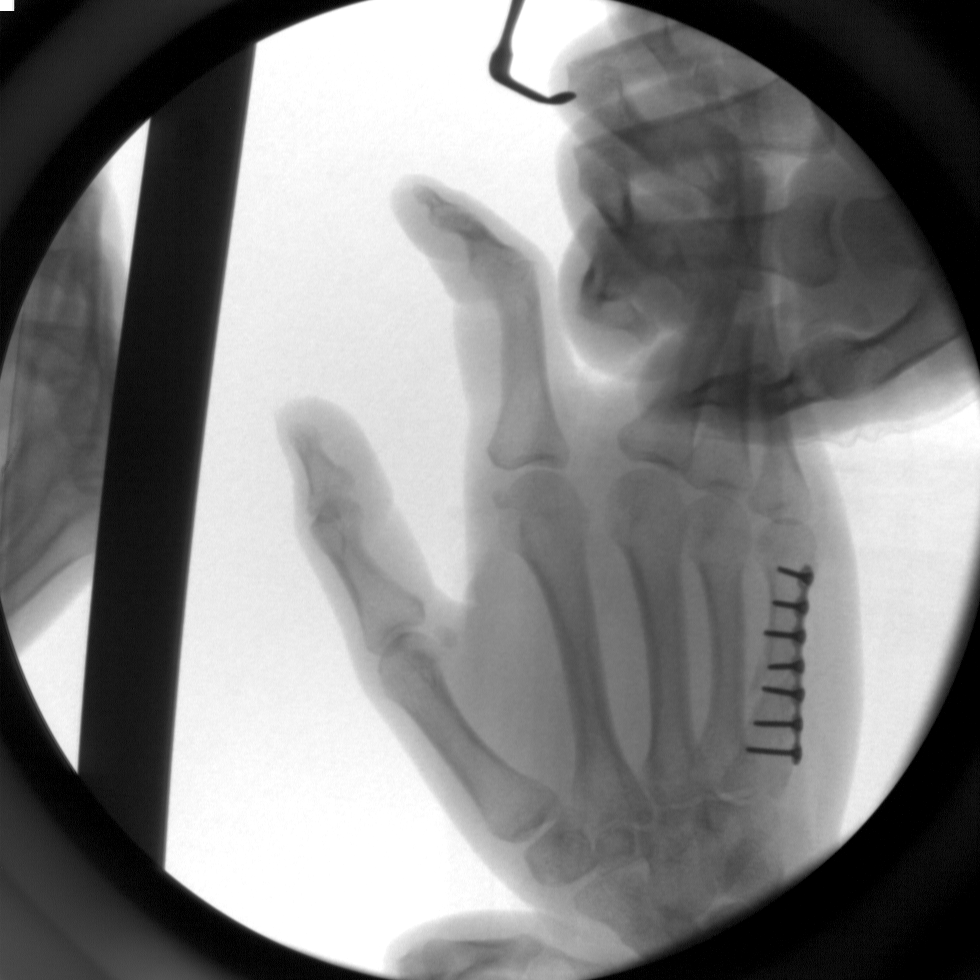
[im 3/3]
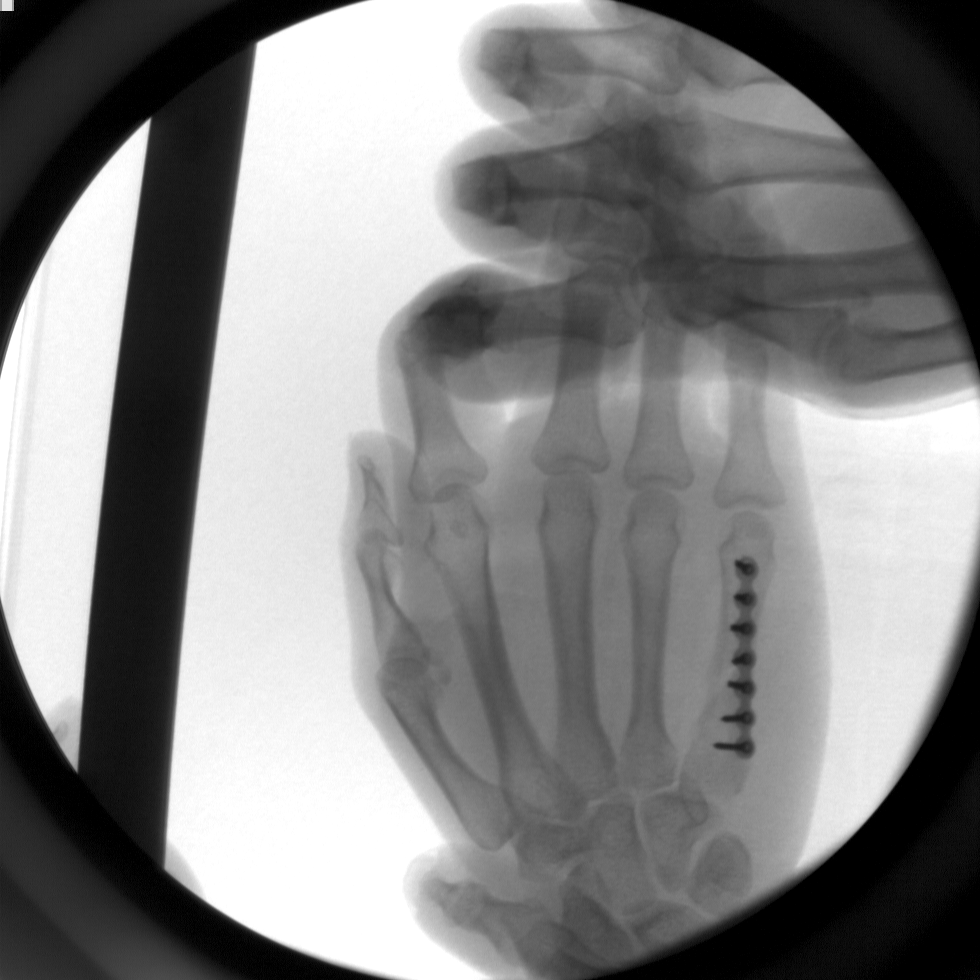

[3 of 3 positions shown; findings below may reference images not displayed]

FINDINGS: A plate has been affixed to the fifth metacarpal, crossing the site
of fracture, with numerous screws. Hardware is in good position.
Improved alignment.
IMPRESSION: Fifth metacarpal fracture repair as above.

## 2019-12-19 SURGERY — OPEN REDUCTION INTERNAL FIXATION (ORIF) METACARPAL
Anesthesia: Monitor Anesthesia Care | Site: Finger | Laterality: Right

## 2019-12-19 MED ORDER — ONDANSETRON HCL 4 MG/2ML IJ SOLN
INTRAMUSCULAR | Status: DC | PRN
  Administered 2019-12-19: 4 mg via INTRAVENOUS

## 2019-12-19 MED ORDER — ACETAMINOPHEN 500 MG PO TABS: 1000.0000 mg | ORAL_TABLET | Freq: Once | ORAL | Status: DC

## 2019-12-19 MED ORDER — DEXAMETHASONE SODIUM PHOSPHATE 10 MG/ML IJ SOLN
INTRAMUSCULAR | Status: DC | PRN
  Administered 2019-12-19: 10 mg

## 2019-12-19 MED ORDER — LIDOCAINE 2% (20 MG/ML) 5 ML SYRINGE
INTRAMUSCULAR | Status: DC | PRN
  Administered 2019-12-19: 4 mL via INTRAVENOUS

## 2019-12-19 MED ORDER — OXYCODONE HCL 5 MG/5ML PO SOLN: 5.0000 mg | Freq: Once | ORAL | Status: DC | PRN

## 2019-12-19 MED ORDER — MEPERIDINE HCL 25 MG/ML IJ SOLN: 6.2500 mg | INTRAMUSCULAR | Status: DC | PRN

## 2019-12-19 MED ORDER — DEXMEDETOMIDINE HCL IN NACL 200 MCG/50ML IV SOLN
INTRAVENOUS | Status: DC | PRN
  Administered 2019-12-19: 8 ug via INTRAVENOUS
  Administered 2019-12-19: 4 ug via INTRAVENOUS

## 2019-12-19 MED ORDER — BUPIVACAINE HCL (PF) 0.5 % IJ SOLN
INTRAMUSCULAR | Status: AC
  Filled 2019-12-19: qty 30

## 2019-12-19 MED ORDER — MIDAZOLAM HCL 2 MG/2ML IJ SOLN
INTRAMUSCULAR | Status: AC
  Filled 2019-12-19: qty 2

## 2019-12-19 MED ORDER — ROPIVACAINE HCL 5 MG/ML IJ SOLN
INTRAMUSCULAR | Status: DC | PRN
  Administered 2019-12-19: 16 mL via PERINEURAL

## 2019-12-19 MED ORDER — PROPOFOL 500 MG/50ML IV EMUL
INTRAVENOUS | Status: DC | PRN
  Administered 2019-12-19: 125 ug/kg/min via INTRAVENOUS

## 2019-12-19 MED ORDER — PROMETHAZINE HCL 25 MG/ML IJ SOLN: 6.2500 mg | INTRAMUSCULAR | Status: DC | PRN

## 2019-12-19 MED ORDER — PROPOFOL 500 MG/50ML IV EMUL
INTRAVENOUS | Status: AC
  Filled 2019-12-19: qty 50

## 2019-12-19 MED ORDER — FENTANYL CITRATE (PF) 100 MCG/2ML IJ SOLN
INTRAMUSCULAR | Status: AC
  Filled 2019-12-19: qty 2

## 2019-12-19 MED ORDER — KETOROLAC TROMETHAMINE 30 MG/ML IJ SOLN: 30.0000 mg | Freq: Once | INTRAMUSCULAR | Status: DC | PRN

## 2019-12-19 MED ORDER — MIDAZOLAM HCL 2 MG/2ML IJ SOLN
1.0000 mg | INTRAMUSCULAR | Status: DC | PRN
  Administered 2019-12-19 (×2): 2 mg via INTRAVENOUS

## 2019-12-19 MED ORDER — HYDROMORPHONE HCL 1 MG/ML IJ SOLN: 0.2500 mg | INTRAMUSCULAR | Status: DC | PRN

## 2019-12-19 MED ORDER — CEFAZOLIN SODIUM-DEXTROSE 2-4 GM/100ML-% IV SOLN
INTRAVENOUS | Status: AC
  Filled 2019-12-19: qty 100

## 2019-12-19 MED ORDER — PROPOFOL 10 MG/ML IV BOLUS
INTRAVENOUS | Status: DC | PRN
  Administered 2019-12-19 (×2): 30 mg via INTRAVENOUS

## 2019-12-19 MED ORDER — LIDOCAINE-EPINEPHRINE 2 %-1:100000 IJ SOLN
INTRAMUSCULAR | Status: AC
  Filled 2019-12-19: qty 1

## 2019-12-19 MED ORDER — LIDOCAINE-EPINEPHRINE 0.5 %-1:200000 IJ SOLN
INTRAMUSCULAR | Status: AC
  Filled 2019-12-19: qty 1

## 2019-12-19 MED ORDER — LACTATED RINGERS IV SOLN: INTRAVENOUS | Status: DC

## 2019-12-19 MED ORDER — CEFAZOLIN SODIUM-DEXTROSE 2-4 GM/100ML-% IV SOLN
2.0000 g | INTRAVENOUS | Status: AC
  Administered 2019-12-19: 2 g via INTRAVENOUS

## 2019-12-19 MED ORDER — FENTANYL CITRATE (PF) 100 MCG/2ML IJ SOLN
50.0000 ug | INTRAMUSCULAR | Status: DC | PRN
  Administered 2019-12-19: 100 ug via INTRAVENOUS

## 2019-12-19 MED ORDER — LIDOCAINE HCL (PF) 1 % IJ SOLN
INTRAMUSCULAR | Status: AC
  Filled 2019-12-19: qty 30

## 2019-12-19 MED ORDER — OXYCODONE HCL 5 MG PO TABS: 5.0000 mg | ORAL_TABLET | Freq: Once | ORAL | Status: DC | PRN

## 2019-12-19 SURGICAL SUPPLY — 62 items
APL PRP STRL LF DISP 70% ISPRP (MISCELLANEOUS) ×1
BIT DRILL 1.1 (BIT) ×2
BIT DRILL 60X20X1.1XQC TMX (BIT) IMPLANT
BIT DRL 60X20X1.1XQC TMX (BIT) ×1
BLADE MINI RND TIP GREEN BEAV (BLADE) IMPLANT
BLADE SURG 15 STRL LF DISP TIS (BLADE) ×1 IMPLANT
BLADE SURG 15 STRL SS (BLADE) ×2
BNDG CMPR 9X4 STRL LF SNTH (GAUZE/BANDAGES/DRESSINGS) ×1
BNDG COHESIVE 4X5 TAN STRL (GAUZE/BANDAGES/DRESSINGS) ×2 IMPLANT
BNDG ESMARK 4X9 LF (GAUZE/BANDAGES/DRESSINGS) ×2 IMPLANT
BNDG GAUZE ELAST 4 BULKY (GAUZE/BANDAGES/DRESSINGS) ×2 IMPLANT
CANISTER SUCT 1200ML W/VALVE (MISCELLANEOUS) IMPLANT
CHLORAPREP W/TINT 26 (MISCELLANEOUS) ×2 IMPLANT
CORD BIPOLAR FORCEPS 12FT (ELECTRODE) ×2 IMPLANT
COVER BACK TABLE 60X90IN (DRAPES) ×2 IMPLANT
COVER MAYO STAND STRL (DRAPES) ×2 IMPLANT
COVER WAND RF STERILE (DRAPES) IMPLANT
CUFF TOURN SGL QUICK 18X4 (TOURNIQUET CUFF) IMPLANT
DRAPE C-ARM 42X72 X-RAY (DRAPES) ×2 IMPLANT
DRAPE EXTREMITY T 121X128X90 (DISPOSABLE) ×2 IMPLANT
DRAPE SURG 17X23 STRL (DRAPES) ×2 IMPLANT
DRSG EMULSION OIL 3X3 NADH (GAUZE/BANDAGES/DRESSINGS) ×2 IMPLANT
GAUZE SPONGE 4X4 12PLY STRL LF (GAUZE/BANDAGES/DRESSINGS) ×2 IMPLANT
GLOVE BIO SURGEON STRL SZ7.5 (GLOVE) ×2 IMPLANT
GLOVE BIOGEL PI IND STRL 6.5 (GLOVE) IMPLANT
GLOVE BIOGEL PI IND STRL 7.0 (GLOVE) ×1 IMPLANT
GLOVE BIOGEL PI IND STRL 8 (GLOVE) ×1 IMPLANT
GLOVE BIOGEL PI INDICATOR 6.5 (GLOVE) ×1
GLOVE BIOGEL PI INDICATOR 7.0 (GLOVE) ×1
GLOVE BIOGEL PI INDICATOR 8 (GLOVE) ×1
GLOVE ECLIPSE 6.5 STRL STRAW (GLOVE) ×3 IMPLANT
GOWN STRL REUS W/ TWL LRG LVL3 (GOWN DISPOSABLE) ×2 IMPLANT
GOWN STRL REUS W/TWL LRG LVL3 (GOWN DISPOSABLE) ×4
GOWN STRL REUS W/TWL XL LVL3 (GOWN DISPOSABLE) ×2 IMPLANT
LOCK SCREW 1.5X9MM (Screw) ×6 IMPLANT
NDL HYPO 25X1 1.5 SAFETY (NEEDLE) IMPLANT
NEEDLE HYPO 25X1 1.5 SAFETY (NEEDLE) IMPLANT
NS IRRIG 1000ML POUR BTL (IV SOLUTION) ×2 IMPLANT
PACK BASIN DAY SURGERY FS (CUSTOM PROCEDURE TRAY) ×2 IMPLANT
PADDING CAST ABS 4INX4YD NS (CAST SUPPLIES)
PADDING CAST ABS COTTON 4X4 ST (CAST SUPPLIES) IMPLANT
PLATE STRAIGHT LOCK 1.5 (Plate) ×1 IMPLANT
SCREW L 1.5X12 (Screw) ×1 IMPLANT
SCREW L 1.5X14 (Screw) ×1 IMPLANT
SCREW LOCK 1.5X9MM (Screw) IMPLANT
SCREW LOCKING 1.5X10 (Screw) ×2 IMPLANT
SCREW LOCKING 1.5X13MM (Screw) ×1 IMPLANT
SLEEVE SCD COMPRESS KNEE MED (MISCELLANEOUS) ×2 IMPLANT
SPLINT PLASTER CAST XFAST 3X15 (CAST SUPPLIES) ×1 IMPLANT
SPLINT PLASTER XTRA FASTSET 3X (CAST SUPPLIES) ×6
SPLINT UNIVERSAL RIGHT (SOFTGOODS) ×1 IMPLANT
STOCKINETTE 6  STRL (DRAPES) ×1
STOCKINETTE 6 STRL (DRAPES) ×1 IMPLANT
SUCTION FRAZIER HANDLE 10FR (MISCELLANEOUS)
SUCTION TUBE FRAZIER 10FR DISP (MISCELLANEOUS) IMPLANT
SUT VICRYL RAPIDE 4-0 (SUTURE) IMPLANT
SUT VICRYL RAPIDE 4/0 PS 2 (SUTURE) IMPLANT
SYR 10ML LL (SYRINGE) IMPLANT
SYR BULB 3OZ (MISCELLANEOUS) IMPLANT
TOWEL GREEN STERILE FF (TOWEL DISPOSABLE) ×2 IMPLANT
TUBE CONNECTING 20X1/4 (TUBING) IMPLANT
UNDERPAD 30X36 HEAVY ABSORB (UNDERPADS AND DIAPERS) ×2 IMPLANT

## 2019-12-19 NOTE — Anesthesia Postprocedure Evaluation (Signed)
Anesthesia Post Note  Patient: Sonya Prince  Procedure(s) Performed: OPEN TREATMENT OF RIGHT 5TH METACARPAL FRACTURE (Right Finger)     Patient location during evaluation: PACU Anesthesia Type: Regional and MAC Level of consciousness: awake and alert Pain management: pain level controlled Vital Signs Assessment: post-procedure vital signs reviewed and stable Respiratory status: spontaneous breathing, nonlabored ventilation and respiratory function stable Cardiovascular status: blood pressure returned to baseline and stable Postop Assessment: no apparent nausea or vomiting Anesthetic complications: no    Last Vitals:  Vitals:   12/19/19 1100 12/19/19 1120  BP: 97/73 110/80  Pulse: 75 78  Resp: 17 16  Temp:  37.1 C  SpO2: 97% 98%    Last Pain:  Vitals:   12/19/19 1120  TempSrc:   PainSc: 0-No pain                 Jarome Matin [place]ichi Platte

## 2019-12-19 NOTE — Anesthesia Procedure Notes (Signed)
Anesthesia Regional Block: Supraclavicular block   Pre-Anesthetic Checklist: ,, timeout performed, Correct Patient, Correct Site, Correct Laterality, Correct Procedure, Correct Position, site marked, Risks and benefits discussed,  Surgical consent,  Pre-op evaluation,  At surgeon's request and post-op pain management  Laterality: Right  Prep: Maximum Sterile Barrier Precautions used, chloraprep       Needles:  Injection technique: Single-shot  Needle Type: Echogenic Stimulator Needle     Needle Length: 9cm  Needle Gauge: 22     Additional Needles:   Procedures:,,,, ultrasound used (permanent image in chart),,,,  Narrative:  Start time: 12/19/2019 9:00 AM End time: 12/19/2019 9:05 AM Injection made incrementally with aspirations every 5 mL.  Performed by: Personally  Anesthesiologist: Pervis Hocking, DO  Additional Notes: Monitors applied. No increased pain on injection. No increased resistance to injection. Injection made in 5cc increments. Good needle visualization. Patient tolerated procedure well.

## 2019-12-19 NOTE — Discharge Instructions (Signed)
Discharge Instructions   You have a dressing with a plaster splint incorporated in it. Move your fingers as much as possible, making a full fist and fully opening the fist. Elevate your hand to reduce pain & swelling of the digits.  Ice over the operative site may be helpful to reduce pain & swelling.  DO NOT USE HEAT. Pain medicine was prescribed for you.  Take Ibuprofen and Tylenol (600 mg and 650 mg) together every 6 hours for pain management. Take the prescribed Oxycodone 5 mg every 6 hours as a severe post op pain rescue medicine. Leave the dressing in place until you return to our office.  You may shower, but keep the bandage clean & dry.  You may drive a car when you are off of prescription pain medications and can safely control your vehicle with both hands. Call our office to arrange a follow up appointment for 10-15 days from the date of surgery.   Please call [phone] during normal business hours or [phone] after hours for any problems. Including the following:  - excessive redness of the incisions - drainage for more than 4 days - fever of more than 101.5 F  *Please note that pain medications will not be refilled after hours or on weekends.   Regional Anesthesia Blocks  1. Numbness or the inability to move the "blocked" extremity may last from 3-48 hours after placement. The length of time depends on the medication injected and your individual response to the medication. If the numbness is not going away after 48 hours, call your surgeon.  2. The extremity that is blocked will need to be protected until the numbness is gone and the  Strength has returned. Because you cannot feel it, you will need to take extra care to avoid injury. Because it may be weak, you may have difficulty moving it or using it. You may not know what position it is in without looking at it while the block is in effect.  3. For blocks in the legs and feet, returning to weight bearing and walking  needs to be done carefully. You will need to wait until the numbness is entirely gone and the strength has returned. You should be able to move your leg and foot normally before you try and bear weight or walk. You will need someone to be with you when you first try to ensure you do not fall and possibly risk injury.  4. Bruising and tenderness at the needle site are common side effects and will resolve in a few days.  5. Persistent numbness or new problems with movement should be communicated to the surgeon or the [facility] [phone] [place] [phone]).    Post Anesthesia Home Care Instructions  Activity: Get plenty of rest for the remainder of the day. A responsible individual must stay with you for 24 hours following the procedure.  For the next 24 hours, DO NOT: -Drive a car -Paediatric nurse -Drink alcoholic beverages -Take any medication unless instructed by your physician -Make any legal decisions or sign important papers.  Meals: Start with liquid foods such as gelatin or soup. Progress to regular foods as tolerated. Avoid greasy, spicy, heavy foods. If nausea and/or vomiting occur, drink only clear liquids until the nausea and/or vomiting subsides. Call your physician if vomiting continues.  Special Instructions/Symptoms: Your throat may feel dry or sore from the anesthesia or the breathing tube placed in your throat during surgery. If this causes  discomfort, gargle with warm salt water. The discomfort should disappear within 24 hours.  If you had a scopolamine patch placed behind your ear for the management of post- operative nausea and/or vomiting:  1. The medication in the patch is effective for 72 hours, after which it should be removed.  Wrap patch in a tissue and discard in the trash. Wash hands thoroughly with soap and water. 2. You may remove the patch earlier than 72 hours if you experience unpleasant side effects which may  include dry mouth, dizziness or visual disturbances. 3. Avoid touching the patch. Wash your hands with soap and water after contact with the patch.

## 2019-12-19 NOTE — Transfer of Care (Signed)
Immediate Anesthesia Transfer of Care Note  Patient: Sonya Prince  Procedure(s) Performed: OPEN TREATMENT OF RIGHT 5TH METACARPAL FRACTURE (Right Finger)  Patient Location: PACU  Anesthesia Type:MAC combined with regional for post-op pain  Level of Consciousness: drowsy and patient cooperative  Airway & Oxygen Therapy: Patient Spontanous Breathing and Patient connected to face mask oxygen  Post-op Assessment: Report given to RN and Post -op Vital signs reviewed and stable  Post vital signs: Reviewed and stable  Last Vitals:  Vitals Value Taken Time  BP 96/62 12/19/19 1023  Temp    Pulse 62   Resp 11 12/19/19 1025  SpO2 99%   Vitals shown include unvalidated device data.  Last Pain:  Vitals:   12/19/19 0832  TempSrc: Oral  PainSc: 4       Patients Stated Pain Goal: 4 (53/01/04 0459)  Complications: No apparent anesthesia complications
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
## 2019-12-19 NOTE — Progress Notes (Signed)
Assisted Dr. Doroteo Glassman with right, ultrasound guided, supraclavicular block. Side rails up, monitors on throughout procedure. See vital signs in flow sheet. Tolerated Procedure well.

## 2019-12-19 NOTE — Interval H&P Note (Signed)
History and Physical Interval Note:  12/19/2019 9:21 AM  Sonya Prince  has presented today for surgery, with the diagnosis of RIGHT 5TH METACARPAL FRACTURE.  The various methods of treatment have been discussed with the patient and family. After consideration of risks, benefits and other options for treatment, the patient has consented to  Procedure(s) with comments: OPEN TREATMENT OF RIGHT 5TH METACARPAL FRACTURE (Right) - WITH PREOP REGIONAL BLOCK as a surgical intervention.  The patient's history has been reviewed, patient examined, no change in status, stable for surgery.  I have reviewed the patient's chart and labs.  Questions were answered to the patient's satisfaction.     Jolyn Nap

## 2019-12-19 NOTE — Op Note (Signed)
12/19/2019  9:21 AM  PATIENT:  Sonya Prince  29 y.o. female  PRE-OPERATIVE DIAGNOSIS:  Displaced R 5 MC shaft fx  POST-OPERATIVE DIAGNOSIS:  Same  PROCEDURE:  ORIF displaced R 5 MC fx  SURGEON: Rayvon Char. Grandville Silos, MD  PHYSICIAN ASSISTANT: Morley Kos, OPA-C  ANESTHESIA:  regional and MAC  SPECIMENS:  None  DRAINS: None  EBL:  less than 50 mL  PREOPERATIVE INDICATIONS:  [name] is a  29 y.o. female with closed displaced right fifth metacarpal shaft fracture  The risks benefits and alternatives were discussed with the patient preoperatively including but not limited to the risks of infection, bleeding, nerve injury, cardiopulmonary complications, the need for revision surgery, among others, and the patient verbalized understanding and consented to proceed.  OPERATIVE IMPLANTS: Biomet ALPS 1.64m plate/screws  OPERATIVE PROCEDURE: After receiving prophylactic antibiotics and a regional block, the patient was escorted to the operative theatre and placed in a supine position.   A surgical "time-out" was performed during which the planned procedure, proposed operative site, and the correct patient identity were compared to the operative consent and agreement confirmed by the circulating nurse according to current facility policy. Following application of a tourniquet to the operative extremity, the exposed skin was pre-scrubbed already in holding area, so it was just prepped with Chloraprep and draped in the usual sterile fashion. The limb was exsanguinated with an Esmarch bandage and the tourniquet inflated to approximately 1045mg higher than systolic BP.   A linear longitudinal dorsal incision was marked and made sharply metacarpal shaft.  A combination of spreading and sharp dissection was carried down to the fascia was split over the periosteum radially and ulnarly.  The fracture was provisionally reduced and held with clamps and x-rays plate from the Biomet set was  selected.  Portion of the plate was cut off to provide appropriate length plate, and the plate was secured to the reduced fracture nasal rings.  One screw hole was left open, initially lesion filled with aspiration of the fractures of the screw was right at the fracture site.  Stability was good, alignment anatomic, final angiogram was obtained fluoroscopically.  The wound was irrigated, the periosteum reapproximated over the plate with running 4-0 Vicryl Rapide suture and the tourniquet was released.  Some additional hemostasis was obtained with bipolar electrocautery the skin was reapproximated with running 4-0 Vicryl Rapide horizontal mattress Dressing was applied with a normal wrist splint as the outer layer, and she was taken to the recovery in stable condition.  DISPOSITION: The patient will be discharged home today with typical post-op instructions, returning in 10-15 days for reevaluation with new x-rays of the affected hand out of the splint with assessment of motion at that time.

## 2019-12-20 ENCOUNTER — Encounter: Payer: Self-pay | Admitting: *Deleted

## 2019-12-27 ENCOUNTER — Ambulatory Visit: Payer: Medicaid Other | Attending: Internal Medicine

## 2019-12-27 DIAGNOSIS — Z20822 Contact with and (suspected) exposure to covid-19: Secondary | ICD-10-CM

## 2019-12-28 LAB — NOVEL CORONAVIRUS, NAA: SARS-CoV-2, NAA: NOT DETECTED

## 2020-05-21 ENCOUNTER — Other Ambulatory Visit: Payer: Self-pay

## 2020-05-21 ENCOUNTER — Emergency Department (HOSPITAL_COMMUNITY): Payer: Self-pay

## 2020-05-21 ENCOUNTER — Encounter (HOSPITAL_COMMUNITY): Payer: Self-pay | Admitting: *Deleted

## 2020-05-21 ENCOUNTER — Observation Stay (HOSPITAL_COMMUNITY)
Admission: EM | Admit: 2020-05-21 | Discharge: 2020-05-22 | Disposition: A | Discharge status: Home / Self Care | Payer: Self-pay | Attending: Internal Medicine | Admitting: Internal Medicine

## 2020-05-21 DIAGNOSIS — Z23 Encounter for immunization: Secondary | ICD-10-CM | POA: Insufficient documentation

## 2020-05-21 DIAGNOSIS — Z881 Allergy status to other antibiotic agents status: Secondary | ICD-10-CM | POA: Insufficient documentation

## 2020-05-21 DIAGNOSIS — Z8661 Personal history of infections of the central nervous system: Secondary | ICD-10-CM | POA: Insufficient documentation

## 2020-05-21 DIAGNOSIS — Z590 Homelessness: Secondary | ICD-10-CM | POA: Insufficient documentation

## 2020-05-21 DIAGNOSIS — Z791 Long term (current) use of non-steroidal anti-inflammatories (NSAID): Secondary | ICD-10-CM | POA: Insufficient documentation

## 2020-05-21 DIAGNOSIS — G928 Other toxic encephalopathy: Secondary | ICD-10-CM | POA: Diagnosis present

## 2020-05-21 DIAGNOSIS — F319 Bipolar disorder, unspecified: Secondary | ICD-10-CM | POA: Insufficient documentation

## 2020-05-21 DIAGNOSIS — Z20822 Contact with and (suspected) exposure to covid-19: Secondary | ICD-10-CM | POA: Insufficient documentation

## 2020-05-21 DIAGNOSIS — F4310 Post-traumatic stress disorder, unspecified: Secondary | ICD-10-CM | POA: Insufficient documentation

## 2020-05-21 DIAGNOSIS — G40409 Other generalized epilepsy and epileptic syndromes, not intractable, without status epilepticus: Principal | ICD-10-CM | POA: Insufficient documentation

## 2020-05-21 DIAGNOSIS — F17210 Nicotine dependence, cigarettes, uncomplicated: Secondary | ICD-10-CM | POA: Insufficient documentation

## 2020-05-21 DIAGNOSIS — D496 Neoplasm of unspecified behavior of brain: Secondary | ICD-10-CM | POA: Insufficient documentation

## 2020-05-21 DIAGNOSIS — F909 Attention-deficit hyperactivity disorder, unspecified type: Secondary | ICD-10-CM | POA: Insufficient documentation

## 2020-05-21 DIAGNOSIS — Z9119 Patient's noncompliance with other medical treatment and regimen: Secondary | ICD-10-CM | POA: Insufficient documentation

## 2020-05-21 DIAGNOSIS — F3111 Bipolar disorder, current episode manic without psychotic features, mild: Secondary | ICD-10-CM | POA: Diagnosis present

## 2020-05-21 DIAGNOSIS — F419 Anxiety disorder, unspecified: Secondary | ICD-10-CM | POA: Insufficient documentation

## 2020-05-21 DIAGNOSIS — Z79899 Other long term (current) drug therapy: Secondary | ICD-10-CM | POA: Insufficient documentation

## 2020-05-21 DIAGNOSIS — G92 Toxic encephalopathy: Secondary | ICD-10-CM | POA: Insufficient documentation

## 2020-05-21 DIAGNOSIS — R000 Tachycardia, unspecified: Secondary | ICD-10-CM | POA: Insufficient documentation

## 2020-05-21 DIAGNOSIS — R569 Unspecified convulsions: Secondary | ICD-10-CM | POA: Insufficient documentation

## 2020-05-21 DIAGNOSIS — Z9114 Patient's other noncompliance with medication regimen: Secondary | ICD-10-CM | POA: Insufficient documentation

## 2020-05-21 DIAGNOSIS — F1990 Other psychoactive substance use, unspecified, uncomplicated: Secondary | ICD-10-CM

## 2020-05-21 DIAGNOSIS — G936 Cerebral edema: Secondary | ICD-10-CM | POA: Insufficient documentation

## 2020-05-21 LAB — CBC WITH DIFFERENTIAL/PLATELET
Abs Immature Granulocytes: 0.06 10*3/uL (ref 0.00–0.07)
Basophils Absolute: 0 10*3/uL (ref 0.0–0.1)
Basophils Relative: 0 %
Eosinophils Absolute: 0 10*3/uL (ref 0.0–0.5)
Eosinophils Relative: 0 %
HCT: 42.8 % (ref 36.0–46.0)
Hemoglobin: 14.2 g/dL (ref 12.0–15.0)
Immature Granulocytes: 0 %
Lymphocytes Relative: 8 %
Lymphs Abs: 1.1 10*3/uL (ref 0.7–4.0)
MCH: 31.5 pg (ref 26.0–34.0)
MCHC: 33.2 g/dL (ref 30.0–36.0)
MCV: 94.9 fL (ref 80.0–100.0)
Monocytes Absolute: 0.7 10*3/uL (ref 0.1–1.0)
Monocytes Relative: 5 %
Neutro Abs: 11.8 10*3/uL — ABNORMAL HIGH (ref 1.7–7.7)
Neutrophils Relative %: 87 %
Platelets: 203 10*3/uL (ref 150–400)
RBC: 4.51 MIL/uL (ref 3.87–5.11)
RDW: 11.1 % — ABNORMAL LOW (ref 11.5–15.5)
WBC: 13.8 10*3/uL — ABNORMAL HIGH (ref 4.0–10.5)
nRBC: 0 % (ref 0.0–0.2)

## 2020-05-21 LAB — COMPREHENSIVE METABOLIC PANEL
ALT: 19 U/L (ref 0–44)
AST: 28 U/L (ref 15–41)
Albumin: 4.2 g/dL (ref 3.5–5.0)
Alkaline Phosphatase: 53 U/L (ref 38–126)
Anion gap: 9 (ref 5–15)
BUN: 8 mg/dL (ref 6–20)
CO2: 25 mmol/L (ref 22–32)
Calcium: 9.4 mg/dL (ref 8.9–10.3)
Chloride: 106 mmol/L (ref 98–111)
Creatinine, Ser: 0.89 mg/dL (ref 0.44–1.00)
GFR calc Af Amer: >60 mL/min (ref >60)
GFR calc non Af Amer: >60 mL/min (ref >60)
Glucose, Bld: 110 mg/dL — ABNORMAL HIGH (ref 70–99)
Potassium: 3.6 mmol/L (ref 3.5–5.1)
Sodium: 140 mmol/L (ref 135–145)
Total Bilirubin: 0.7 mg/dL (ref 0.3–1.2)
Total Protein: 7.4 g/dL (ref 6.5–8.1)

## 2020-05-21 LAB — I-STAT BETA HCG BLOOD, ED (MC, WL, AP ONLY): I-stat hCG, quantitative: <5 m[IU]/mL (ref <5)

## 2020-05-21 LAB — ACETAMINOPHEN LEVEL: Acetaminophen (Tylenol), Serum: <10 ug/mL — ABNORMAL LOW (ref 10–30)

## 2020-05-21 LAB — SARS CORONAVIRUS 2 BY RT PCR (HOSPITAL ORDER, PERFORMED IN ~~LOC~~ HOSPITAL LAB): SARS Coronavirus 2: NEGATIVE

## 2020-05-21 LAB — MAGNESIUM: Magnesium: 2.2 mg/dL (ref 1.7–2.4)

## 2020-05-21 LAB — ETHANOL: Alcohol, Ethyl (B): <10 mg/dL (ref <10)

## 2020-05-21 LAB — CBG MONITORING, ED: Glucose-Capillary: 88 mg/dL (ref 70–99)

## 2020-05-21 LAB — LIPASE, BLOOD: Lipase: 19 U/L (ref 11–51)

## 2020-05-21 LAB — LACTIC ACID, PLASMA: Lactic Acid, Venous: 1.6 mmol/L (ref 0.5–1.9)

## 2020-05-21 LAB — SALICYLATE LEVEL: Salicylate Lvl: <7 mg/dL — ABNORMAL LOW (ref 7.0–30.0)

## 2020-05-21 IMAGING — CT CT HEAD W/O CM
4 series · 15 of 47 positions shown, 17 images · non-contrast
Comparison: Prior head CT examinations [DATE] and earlier

CLINICAL DATA: Altered mental status (AMS), unclear cause. Neck
trauma, uncomplicated. Additional history provided: Altered mental
status, neck trauma, witnessed seizure while walking, reported to
have fallen foward onto face.

EXAM:
CT HEAD WITHOUT CONTRAST
CT CERVICAL SPINE WITHOUT CONTRAST
TECHNIQUE: Multidetector CT imaging of the head and cervical spine was
performed following the standard protocol without intravenous
contrast. Multiplanar CT image reconstructions of the cervical spine
were also generated.

[Series 1: head without · axial · non-contrast · 0.43mm/px · z∈[-103,+17]mm · 7 of 33 slices shown, 9 images]
[im 5/33  brain]
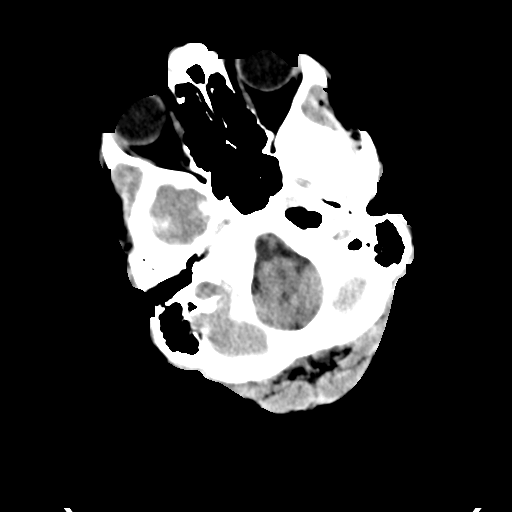
[im 5/33  bone]
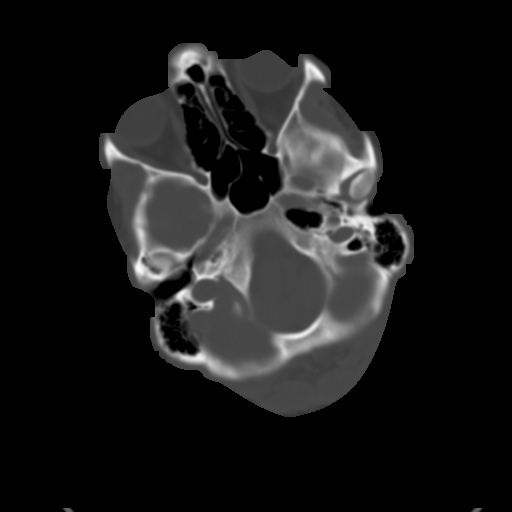
[im 9/33  brain]
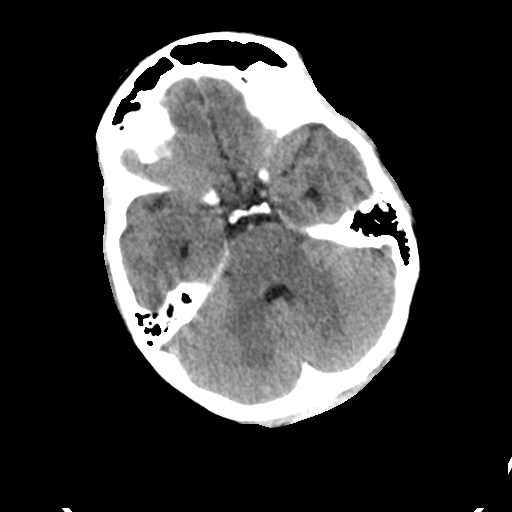
[im 13/33  brain]
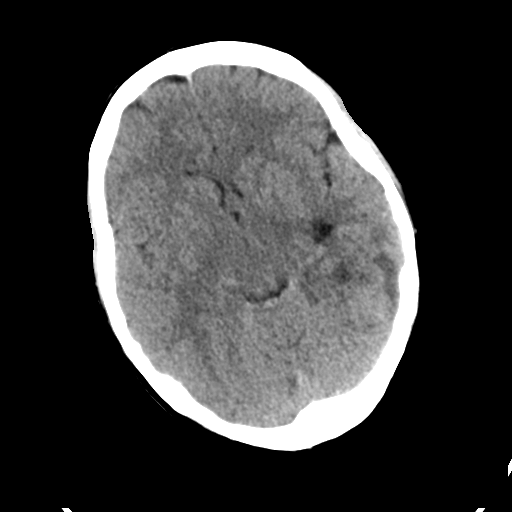
[im 17/33  brain]
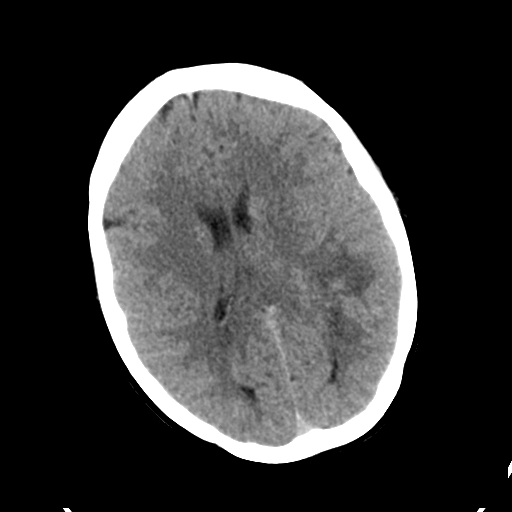
[im 21/33  brain]
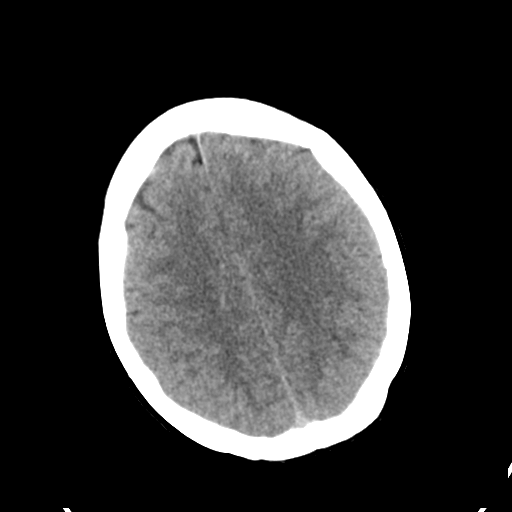
[im 21/33  bone]
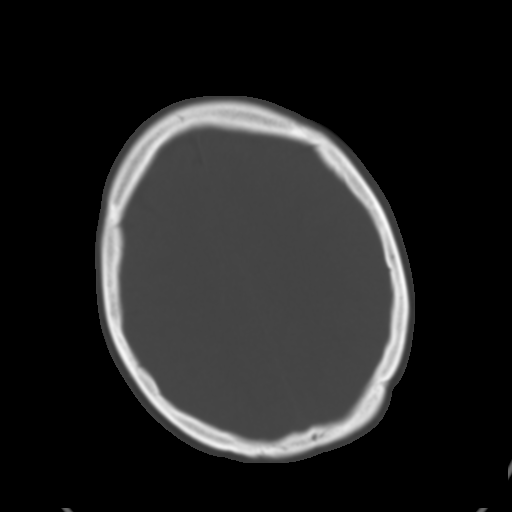
[im 25/33  brain]
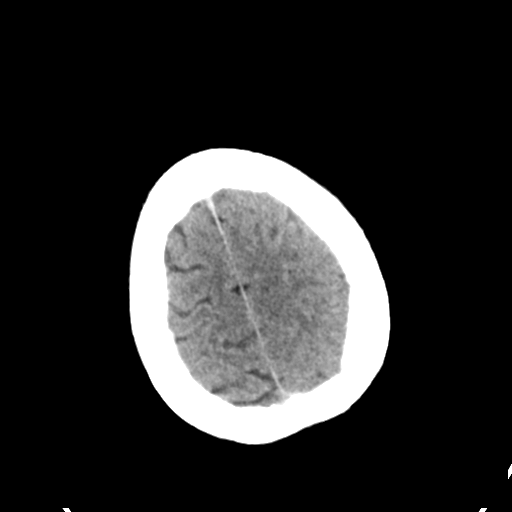
[im 29/33  brain]
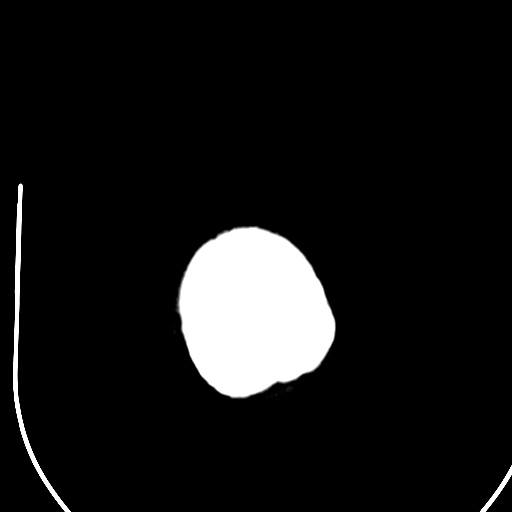

[Series 4: head bone · axial · 0.43mm/px · z∈[-107,-91]mm · 2 of 82 slices shown]
[im 9/82  bone]
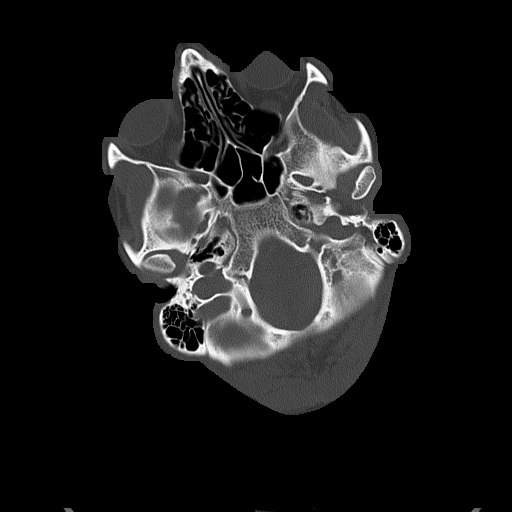
[im 17/82  bone]
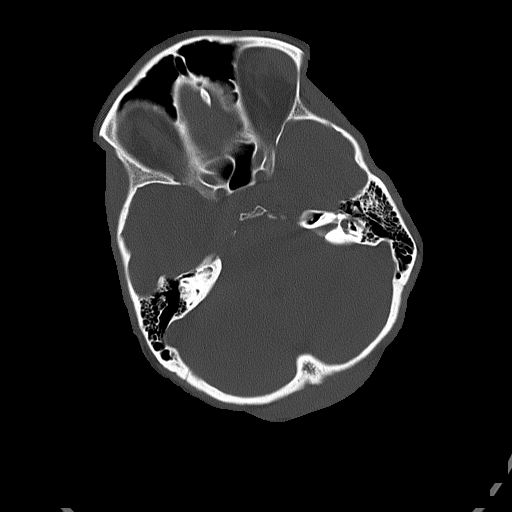

[Series 5: head without cor · coronal · non-contrast · 0.32mm/px · 3 of 70 slices shown]
[im 24/70  brain]
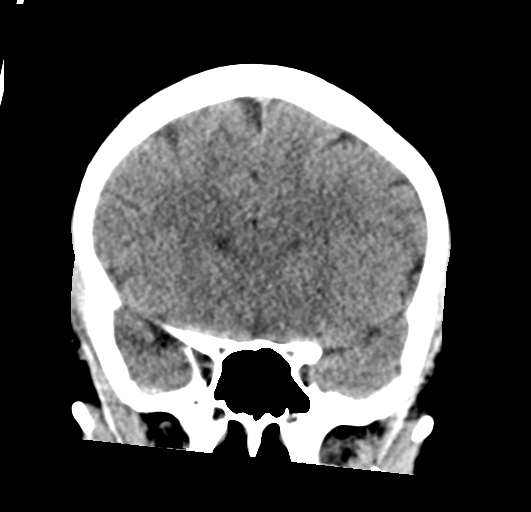
[im 31/70  brain]
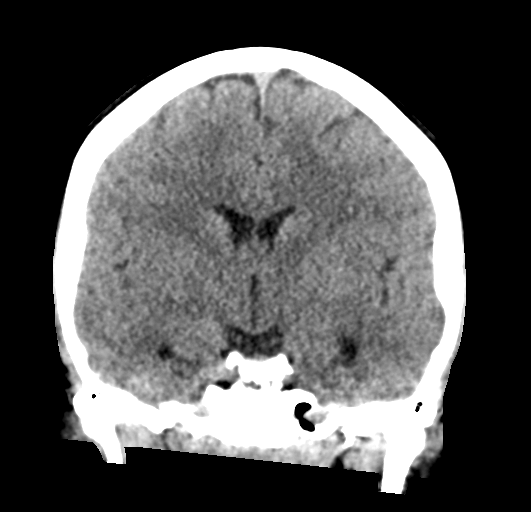
[im 39/70  brain]
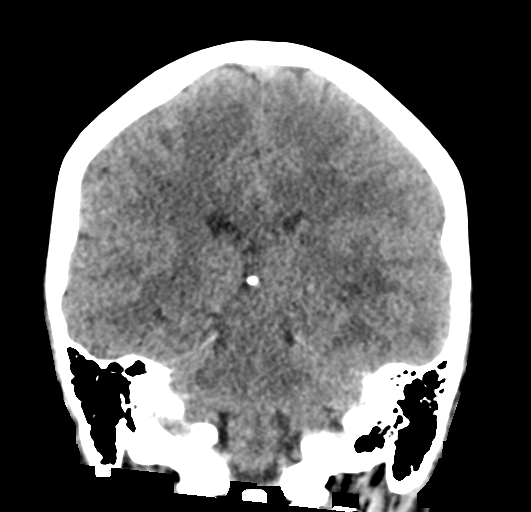

[Series 6: head without sag · sagittal · non-contrast · 0.32mm/px · 3 of 59 slices shown]
[im 23/59  brain]
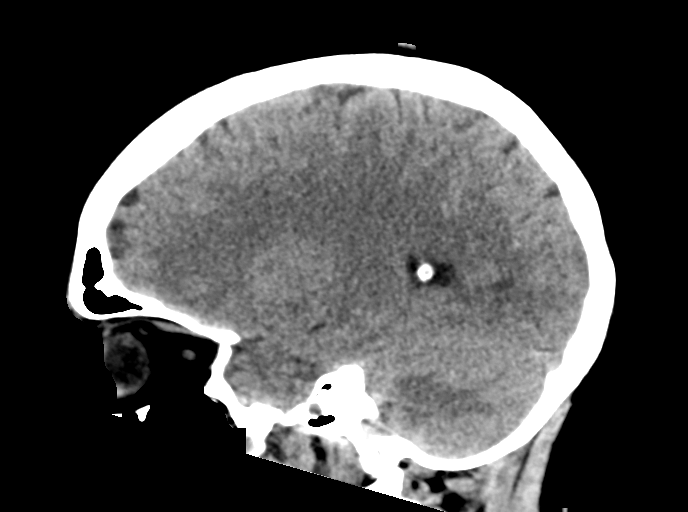
[im 30/59  brain]
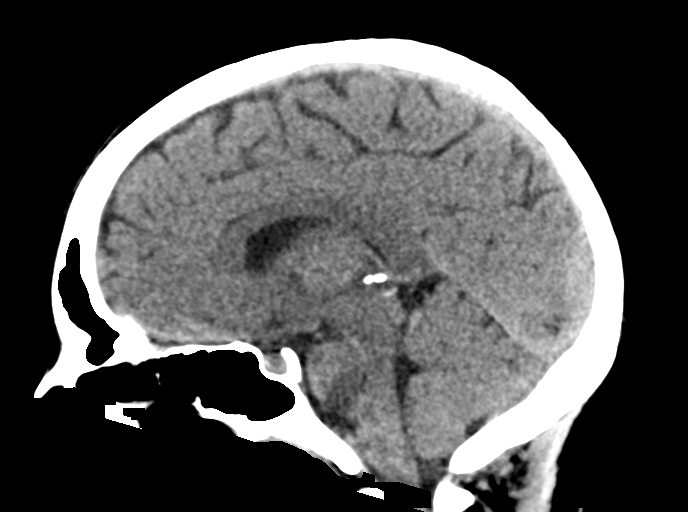
[im 36/59  brain]
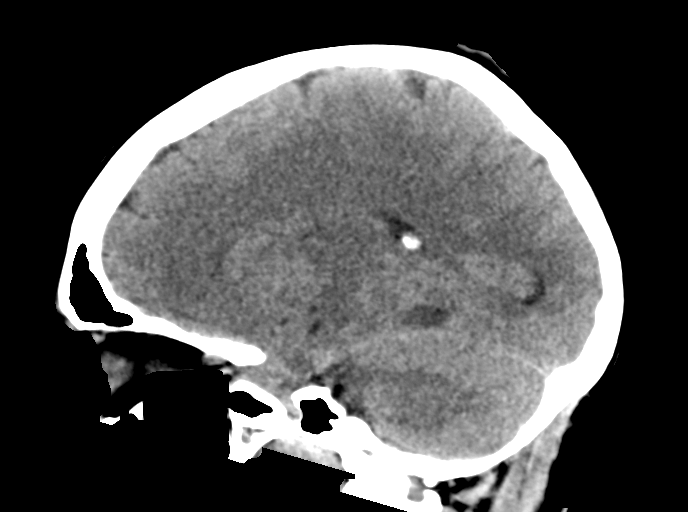

[15 of 47 positions shown; findings below may reference images not displayed]

FINDINGS: CT HEAD FINDINGS

Brain:

There is edema within the left temporal stem and within portions of
the left temporal, parietal and occipital lobes. This edema is
predominantly located within the white matter and likely reflects
vasogenic edema. Redemonstrated chronic subcentimeter focus of
calcification within the left temporal occipital lobes, as well as a
small adjacent left tentorial calcification.

There is associated regional mass effect. There is asymmetric
prominence of the left temporal horn, likely reflecting some degree
of ventricular entrapment.

There is no acute intracranial hemorrhage.

No extra-axial fluid collection.

No midline shift.

Vascular: No hyperdense vessel.

Skull: Normal. Negative for fracture or focal lesion.

Sinuses/Orbits: Visualized orbits show no acute finding. No
significant paranasal sinus disease or mastoid effusion at the
imaged levels.

CT CERVICAL SPINE FINDINGS

Alignment: Straightening of the expected cervical lordosis. No
significant spondylolisthesis

Skull base and vertebrae: The basion-dental and atlanto-dental
intervals are maintained.No evidence of acute fracture to the
cervical spine.

Soft tissues and spinal canal: No prevertebral fluid or swelling. No
visible canal hematoma.

Disc levels: No significant bony spinal canal or neural foraminal
narrowing at any level.

Upper chest: No visible airspace consolidation or pneumothorax
within the imaged lung apices.

These results were called by telephone at the time of interpretation
on [DATE] at [DATE] to provider TALLANA , who verbally
acknowledged these results.
IMPRESSION: CT head:

1. There is edema which appears predominant T[place] vasogenic within
portions of the left temporal, parietal and occipital lobes.
Findings are most suspicious for an underlying mass.
Contrast-enhanced brain MRI is recommended for further
characterization. Redemonstrated small chronic parenchymal and
tentorial calcifications in this region.
2. Regional mass effect with partial effacement of the left lateral
ventricle. Associated prominence of the left temporal horn likely
reflecting ventricular entrapment.

CT cervical spine:

No evidence of acute fracture to the cervical spine.
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
## 2020-05-21 IMAGING — DX DG CHEST 1V PORT
1 series · 1 of 1 positions shown · non-contrast
Comparison: None.

CLINICAL DATA: Seizure, altered mental status.

EXAM:
PORTABLE CHEST 1 VIEW

[chest]
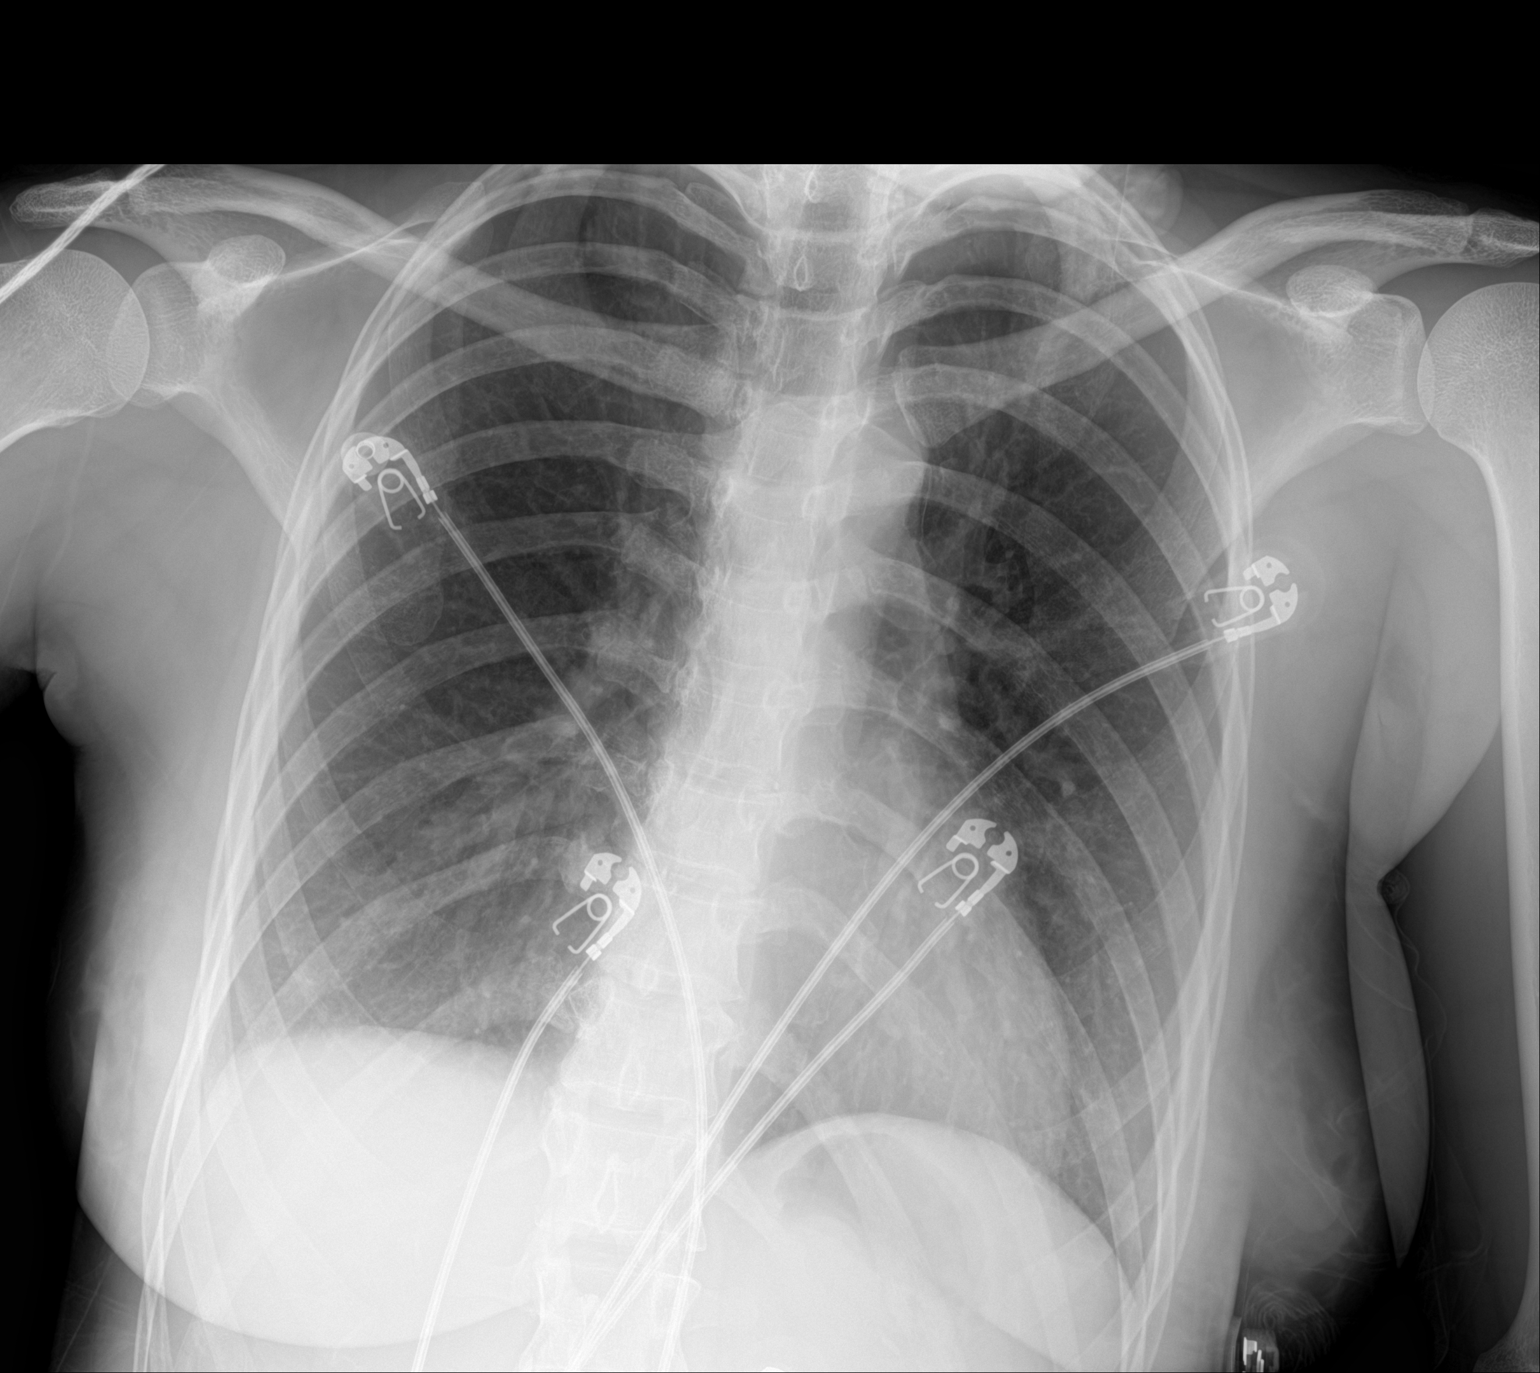

[1 of 1 positions shown; findings below may reference images not displayed]

FINDINGS: The lungs are hyperinflated. There is no evidence of acute
infiltrate, pleural effusion or pneumothorax. The heart size and
mediastinal contours are within normal limits. The visualized
skeletal structures are unremarkable.
IMPRESSION: No active disease.

## 2020-05-21 MED ORDER — LORAZEPAM 2 MG/ML IJ SOLN
2.0000 mg | Freq: Once | INTRAMUSCULAR | Status: AC
  Administered 2020-05-21: 2 mg via INTRAMUSCULAR
  Filled 2020-05-21: qty 1

## 2020-05-21 MED ORDER — LORAZEPAM 2 MG/ML IJ SOLN: 1.0000 mg | Freq: Once | INTRAMUSCULAR | Status: AC

## 2020-05-21 MED ORDER — LORAZEPAM 2 MG/ML IJ SOLN: 2.0000 mg | Freq: Once | INTRAMUSCULAR | Status: DC

## 2020-05-21 MED ORDER — LORAZEPAM 2 MG/ML IJ SOLN
2.0000 mg | Freq: Once | INTRAMUSCULAR | Status: AC
  Administered 2020-05-21: 2 mg via INTRAVENOUS
  Filled 2020-05-21: qty 1

## 2020-05-21 MED ORDER — LORAZEPAM 2 MG/ML IJ SOLN
1.0000 mg | Freq: Once | INTRAMUSCULAR | Status: AC
  Administered 2020-05-21: 1 mg via INTRAMUSCULAR
  Filled 2020-05-21: qty 1

## 2020-05-21 MED ORDER — POLYETHYLENE GLYCOL 3350 17 G PO PACK: 17.0000 g | PACK | Freq: Every day | ORAL | Status: DC | PRN

## 2020-05-21 MED ORDER — LEVETIRACETAM IN NACL 1500 MG/100ML IV SOLN
1500.0000 mg | Freq: Once | INTRAVENOUS | Status: AC
  Administered 2020-05-21: 1500 mg via INTRAVENOUS
  Filled 2020-05-21: qty 100

## 2020-05-21 MED ORDER — ACETAMINOPHEN 650 MG RE SUPP: 650.0000 mg | Freq: Four times a day (QID) | RECTAL | Status: DC | PRN

## 2020-05-21 MED ORDER — ONDANSETRON HCL 4 MG PO TABS: 4.0000 mg | ORAL_TABLET | Freq: Four times a day (QID) | ORAL | Status: DC | PRN

## 2020-05-21 MED ORDER — ACETAMINOPHEN 325 MG PO TABS: 650.0000 mg | ORAL_TABLET | Freq: Four times a day (QID) | ORAL | Status: DC | PRN

## 2020-05-21 MED ORDER — SODIUM CHLORIDE 0.9 % IV BOLUS
1000.0000 mL | Freq: Once | INTRAVENOUS | Status: AC
  Administered 2020-05-21: 1000 mL via INTRAVENOUS

## 2020-05-21 MED ORDER — DIVALPROEX SODIUM 250 MG PO DR TAB
500.0000 mg | DELAYED_RELEASE_TABLET | Freq: Two times a day (BID) | ORAL | Status: DC
  Administered 2020-05-22: 500 mg via ORAL
  Filled 2020-05-21: qty 2

## 2020-05-21 MED ORDER — POTASSIUM CHLORIDE IN NACL 20-0.9 MEQ/L-% IV SOLN
INTRAVENOUS | Status: DC
  Filled 2020-05-21 (×2): qty 1000

## 2020-05-21 MED ORDER — TETANUS-DIPHTH-ACELL PERTUSSIS 5-2.5-18.5 LF-MCG/0.5 IM SUSP
0.5000 mL | Freq: Once | INTRAMUSCULAR | Status: DC
  Filled 2020-05-21: qty 0.5

## 2020-05-21 MED ORDER — LORAZEPAM 2 MG/ML IJ SOLN
INTRAMUSCULAR | Status: AC
  Administered 2020-05-21: 1 mg via INTRAMUSCULAR
  Filled 2020-05-21: qty 1

## 2020-05-21 MED ORDER — LORAZEPAM 2 MG/ML IJ SOLN
2.0000 mg | INTRAMUSCULAR | Status: DC | PRN
  Filled 2020-05-21: qty 1

## 2020-05-21 MED ORDER — VALPROATE SODIUM 500 MG/5ML IV SOLN
1500.0000 mg | Freq: Once | INTRAVENOUS | Status: AC
  Administered 2020-05-21: 1500 mg via INTRAVENOUS
  Filled 2020-05-21: qty 15

## 2020-05-21 MED ORDER — ONDANSETRON HCL 4 MG/2ML IJ SOLN: 4.0000 mg | Freq: Four times a day (QID) | INTRAMUSCULAR | Status: DC | PRN

## 2020-05-21 NOTE — ED Notes (Signed)
CBG Results of 88 reported to [place], Therapist, sports.

## 2020-05-21 NOTE — ED Notes (Signed)
This pt is entirely too agitated to complete any nursing care or tasks at this time. Admitting Provider made aware.

## 2020-05-21 NOTE — Progress Notes (Signed)
Patient came down for her MRI with ordered meds. Patient refusing to lay on her back and cussing at staff. RN aware, sent back to the ED for further evaluation.

## 2020-05-21 NOTE — ED Notes (Signed)
Pt transported to CT ?

## 2020-05-21 NOTE — ED Notes (Signed)
Pt transported to MRI 

## 2020-05-21 NOTE — ED Provider Notes (Signed)
[facility] EMERGENCY DEPARTMENT Provider Note   CSN: [id number] Arrival date & time: 05/21/20  1444    History Chief Complaint  Patient presents with  . Seizures    Sonya Prince is a 29 y.o. female with past medical history significant for childhood meningitis, anxiety, bipolar disorder, polysubstance use, alcohol use who presents for evaluation of seizure.  Level 5 Caveat AMS  Per Nursing via EMS Patient with whitnessed grand mal seizures causing fall PTA. Apparently had 2 additional seizures requiring Versed 5 mg and then 2.5 mg  Patient unwilling to provide any additional information in the ED.  She is postictal, combative however moves all 4 extremities.  Various contusions and abrasions to extremities.  Unwilling to open up to assess for damage.  Per chart review patient with history of substance use, seizures, alcohol use.  Previously on medication for seizures however unclear if currently taking medication.  [phone] Father- Francetta Ilg. Mother Webb Silversmith [phone]   HPI     Past Medical History:  Diagnosis Date  . Anxiety   . Meningitis   . PTSD (post-traumatic stress disorder)     Patient Active Problem List   Diagnosis Date Noted  . Grand mal seizure ([place]) 05/21/2020  . Vasogenic brain edema ([place]) 05/21/2020  . Nicotine dependence, cigarettes, uncomplicated 27/51/7001  . Toxic metabolic encephalopathy 74/94/4967  . Bipolar affective disorder, currently manic, mild ([place]) 01/17/2019    Past Surgical History:  Procedure Laterality Date  . OPEN REDUCTION INTERNAL FIXATION (ORIF) METACARPAL Right 12/19/2019   Procedure: OPEN TREATMENT OF RIGHT 5TH METACARPAL FRACTURE;  Surgeon: Milly Jakob, MD;  Location: [facility];  Service: Orthopedics;  Laterality: Right;  WITH PREOP REGIONAL BLOCK  . WISDOM TOOTH EXTRACTION       OB History   No obstetric history on file.     Family History  Family history unknown: Yes     Social History   Tobacco Use  . Smoking status: Current Every Day Smoker  . Smokeless tobacco: Never Used  Vaping Use  . Vaping Use: Every day  Substance Use Topics  . Alcohol use: Yes    Comment: 3 times a month   . Drug use: Yes    Types: Marijuana    Home Medications Prior to Admission medications   Medication Sig Start Date End Date Taking? Authorizing Provider  acetaminophen (TYLENOL) 325 MG tablet Take 2 tablets (650 mg total) by mouth every 6 (six) hours. 12/17/19   Milly Jakob, MD  amphetamine-dextroamphetamine (ADDERALL) 20 MG tablet Take 20 mg by mouth.    [provider]  clonazePAM (KLONOPIN) 0.5 MG tablet Take 0.5 mg by mouth.    [provider]  ibuprofen (ADVIL) 200 MG tablet Take 3 tablets (600 mg total) by mouth every 6 (six) hours. 12/17/19   Milly Jakob, MD  oxyCODONE (ROXICODONE) 5 MG immediate release tablet Take 1 tablet (5 mg total) by mouth every 6 (six) hours as needed for severe pain. 12/17/19   Milly Jakob, MD    Allergies    Doxycycline  Review of Systems   Review of Systems  Unable to perform ROS: Mental status change    Physical Exam Updated Vital Signs BP 107/70 (BP Location: Right Arm)   Pulse 83   Temp 98.3 F (36.8 C) (Oral)   Resp 16   SpO2 99%   Physical Exam Vitals and nursing note reviewed.  Constitutional:      Appearance: She is well-developed.  Comments: Sleepy, arousable to pain  HENT:     Head: Normocephalic.     Comments: Abrasion to chin.  Unwilling to open mouth assess for intraoral damage Eyes:     Pupils: Pupils are equal, round, and reactive to light.     Comments: Pupils equal reactive to light.  No lateral gaze.  Unable to track with fingers  Neck:     Comments: Patient in c-collar Cardiovascular:     Rate and Rhythm: Tachycardia present.     Pulses: Normal pulses.          Radial pulses are 2+ on the right side and 2+ on the left side.       Dorsalis pedis pulses are 2+ on  the right side and 2+ on the left side.  Pulmonary:     Effort: No respiratory distress.     Breath sounds: Normal breath sounds and air entry.     Comments: Clear bilaterally equal rise and fall of chest no tachypnea Chest:     Comments: No crepitus or step-offs Abdominal:     General: There is no distension.     Comments: Soft, nontender  Musculoskeletal:        General: Normal range of motion.     Comments: Moves all 4 extremities at difficulty.  Skin:    General: Skin is warm and dry.     Comments: Tactile temperature to extremities.  Various contusions and abrasions to hands, knees and feet  Neurological:     Mental Status: She is disoriented and confused.     Comments: Sleepy however arousable to voice.  Unable to track with eyes her pupils equal reactive to light.  No lateral gaze.  Unable to follow most commands.  Mumbled speech.  No obvious facial droop.  Withdrawals to pain.     ED Results / Procedures / Treatments   Labs (all labs ordered are listed, but only abnormal results are displayed) Labs Reviewed  CBC WITH DIFFERENTIAL/PLATELET - Abnormal; Notable for the following components:      Result Value   WBC 13.8 (*)    RDW 11.1 (*)    Neutro Abs 11.8 (*)    All other components within normal limits  COMPREHENSIVE METABOLIC PANEL - Abnormal; Notable for the following components:   Glucose, Bld 110 (*)    All other components within normal limits  ACETAMINOPHEN LEVEL - Abnormal; Notable for the following components:   Acetaminophen (Tylenol), Serum <10 (*)    All other components within normal limits  SALICYLATE LEVEL - Abnormal; Notable for the following components:   Salicylate Lvl <4.0 (*)    All other components within normal limits  SARS CORONAVIRUS 2 BY RT PCR (HOSPITAL ORDER, [place] [facility])  CULTURE, BLOOD (ROUTINE X 2)  CULTURE, BLOOD (ROUTINE X 2)  LIPASE, BLOOD  ETHANOL  LACTIC ACID, PLASMA  MAGNESIUM  RAPID URINE DRUG  SCREEN, HOSP PERFORMED  URINALYSIS, ROUTINE W REFLEX MICROSCOPIC  LACTIC ACID, PLASMA  HIV ANTIBODY (ROUTINE TESTING W REFLEX)  RPR  MAGNESIUM  CBC WITH DIFFERENTIAL/PLATELET  COMPREHENSIVE METABOLIC PANEL  TSH  FOLATE  VITAMIN B12  I-STAT BETA HCG BLOOD, ED ([facility], [facility], [facility] ONLY)  I-STAT VENOUS BLOOD GAS, ED  CBG MONITORING, ED    EKG EKG Interpretation  Date/Time:  Monday May 21 2020 15:31:14 EDT Ventricular Rate:  111 PR Interval:    QRS Duration: 73 QT Interval:  323 QTC Calculation: 439 R Axis:  96 Text Interpretation: Sinus tachycardia Borderline right axis deviation Borderline T wave abnormalities Baseline wander in lead(s) V3 Confirmed by Davonna Belling [phone]) on 05/21/2020 5:58:16 PM   Radiology CT Head Wo Contrast  Result Date: 05/21/2020 CLINICAL DATA:  Altered mental status (AMS), unclear cause. Neck trauma, uncomplicated. Additional history provided: Altered mental status, neck trauma, witnessed seizure while walking, reported to have fallen foward onto face. EXAM: CT HEAD WITHOUT CONTRAST CT CERVICAL SPINE WITHOUT CONTRAST TECHNIQUE: Multidetector CT imaging of the head and cervical spine was performed following the standard protocol without intravenous contrast. Multiplanar CT image reconstructions of the cervical spine were also generated. COMPARISON:  Prior head CT examinations 08/31/2018 and earlier FINDINGS: CT HEAD FINDINGS Brain: There is edema within the left temporal stem and within portions of the left temporal, parietal and occipital lobes. This edema is predominantly located within the white matter and likely reflects vasogenic edema. Redemonstrated chronic subcentimeter focus of calcification within the left temporal occipital lobes, as well as a small adjacent left tentorial calcification. There is associated regional mass effect. There is asymmetric prominence of the left temporal horn, likely reflecting some degree of ventricular entrapment. There is no  acute intracranial hemorrhage. No extra-axial fluid collection. No midline shift. Vascular: No hyperdense vessel. Skull: Normal. Negative for fracture or focal lesion. Sinuses/Orbits: Visualized orbits show no acute finding. No significant paranasal sinus disease or mastoid effusion at the imaged levels. CT CERVICAL SPINE FINDINGS Alignment: Straightening of the expected cervical lordosis. No significant spondylolisthesis Skull base and vertebrae: The basion-dental and atlanto-dental intervals are maintained.No evidence of acute fracture to the cervical spine. Soft tissues and spinal canal: No prevertebral fluid or swelling. No visible canal hematoma. Disc levels: No significant bony spinal canal or neural foraminal narrowing at any level. Upper chest: No visible airspace consolidation or pneumothorax within the imaged lung apices. These results were called by telephone at the time of interpretation on 05/21/2020 at 6:34 pm to provider [facility] , who verbally acknowledged these results. IMPRESSION: CT head: 1. There is edema which appears predominant ly vasogenic within portions of the left temporal, parietal and occipital lobes. Findings are most suspicious for an underlying mass. Contrast-enhanced brain MRI is recommended for further characterization. Redemonstrated small chronic parenchymal and tentorial calcifications in this region. 2. Regional mass effect with partial effacement of the left lateral ventricle. Associated prominence of the left temporal horn likely reflecting ventricular entrapment. CT cervical spine: No evidence of acute fracture to the cervical spine. Electronically Signed   By: Kellie Simmering DO   On: 05/21/2020 18:35   CT Cervical Spine Wo Contrast  Result Date: 05/21/2020 CLINICAL DATA:  Altered mental status (AMS), unclear cause. Neck trauma, uncomplicated. Additional history provided: Altered mental status, neck trauma, witnessed seizure while walking, reported to have fallen  foward onto face. EXAM: CT HEAD WITHOUT CONTRAST CT CERVICAL SPINE WITHOUT CONTRAST TECHNIQUE: Multidetector CT imaging of the head and cervical spine was performed following the standard protocol without intravenous contrast. Multiplanar CT image reconstructions of the cervical spine were also generated. COMPARISON:  Prior head CT examinations 08/31/2018 and earlier FINDINGS: CT HEAD FINDINGS Brain: There is edema within the left temporal stem and within portions of the left temporal, parietal and occipital lobes. This edema is predominantly located within the white matter and likely reflects vasogenic edema. Redemonstrated chronic subcentimeter focus of calcification within the left temporal occipital lobes, as well as a small adjacent left tentorial calcification. There is associated regional mass effect. There is  asymmetric prominence of the left temporal horn, likely reflecting some degree of ventricular entrapment. There is no acute intracranial hemorrhage. No extra-axial fluid collection. No midline shift. Vascular: No hyperdense vessel. Skull: Normal. Negative for fracture or focal lesion. Sinuses/Orbits: Visualized orbits show no acute finding. No significant paranasal sinus disease or mastoid effusion at the imaged levels. CT CERVICAL SPINE FINDINGS Alignment: Straightening of the expected cervical lordosis. No significant spondylolisthesis Skull base and vertebrae: The basion-dental and atlanto-dental intervals are maintained.No evidence of acute fracture to the cervical spine. Soft tissues and spinal canal: No prevertebral fluid or swelling. No visible canal hematoma. Disc levels: No significant bony spinal canal or neural foraminal narrowing at any level. Upper chest: No visible airspace consolidation or pneumothorax within the imaged lung apices. These results were called by telephone at the time of interpretation on 05/21/2020 at 6:34 pm to provider [facility] , who verbally acknowledged these  results. IMPRESSION: CT head: 1. There is edema which appears predominant ly vasogenic within portions of the left temporal, parietal and occipital lobes. Findings are most suspicious for an underlying mass. Contrast-enhanced brain MRI is recommended for further characterization. Redemonstrated small chronic parenchymal and tentorial calcifications in this region. 2. Regional mass effect with partial effacement of the left lateral ventricle. Associated prominence of the left temporal horn likely reflecting ventricular entrapment. CT cervical spine: No evidence of acute fracture to the cervical spine. Electronically Signed   By: Kellie Simmering DO   On: 05/21/2020 18:35   DG Chest Portable 1 View  Result Date: 05/21/2020 CLINICAL DATA:  Seizure, altered mental status. EXAM: PORTABLE CHEST 1 VIEW COMPARISON:  None. FINDINGS: The lungs are hyperinflated. There is no evidence of acute infiltrate, pleural effusion or pneumothorax. The heart size and mediastinal contours are within normal limits. The visualized skeletal structures are unremarkable. IMPRESSION: No active disease. Electronically Signed   By: Virgina Norfolk M.D.   On: 05/21/2020 16:26    Procedures .Critical Care Performed by: Nettie Elm, PA-C Authorized by: Nettie Elm, PA-C   Critical care provider statement:    Critical care time (minutes):  45   Critical care was necessary to treat or prevent imminent or life-threatening deterioration of the following conditions:  Circulatory failure   Critical care was time spent personally by me on the following activities:  Discussions with consultants, evaluation of patient's response to treatment, examination of patient, ordering and performing treatments and interventions, ordering and review of laboratory studies, ordering and review of radiographic studies, pulse oximetry, re-evaluation of patient's condition, obtaining history from patient or surrogate and review of old charts    (including critical care time)  Medications Ordered in ED Medications  Tdap (BOOSTRIX) injection 0.5 mL (0 mLs Intramuscular Hold 05/21/20 1733)  polyethylene glycol (MIRALAX / GLYCOLAX) packet 17 g (has no administration in time range)  ondansetron (ZOFRAN) tablet 4 mg (has no administration in time range)    Or  ondansetron (ZOFRAN) injection 4 mg (has no administration in time range)  0.9 % NaCl with KCl 20 mEq/ L  infusion (has no administration in time range)  acetaminophen (TYLENOL) tablet 650 mg (has no administration in time range)    Or  acetaminophen (TYLENOL) suppository 650 mg (has no administration in time range)  LORazepam (ATIVAN) injection 2 mg (has no administration in time range)  valproate (DEPACON) 1,500 mg in dextrose 5 % 50 mL IVPB (has no administration in time range)  divalproex (DEPAKOTE) DR tablet 500 mg (has no  administration in time range)  LORazepam (ATIVAN) injection 1 mg (1 mg Intramuscular Given 05/21/20 1520)  sodium chloride 0.9 % bolus 1,000 mL (0 mLs Intravenous Stopped 05/21/20 1908)  LORazepam (ATIVAN) injection 1 mg (1 mg Intramuscular Given 05/21/20 1555)  levETIRAcetam (KEPPRA) IVPB 1500 mg/ 100 mL premix (0 mg Intravenous Stopped 05/21/20 1737)  LORazepam (ATIVAN) injection 2 mg (2 mg Intramuscular Given 05/21/20 1639)  sodium chloride 0.9 % bolus 1,000 mL (0 mLs Intravenous Stopped 05/21/20 1908)  LORazepam (ATIVAN) injection 2 mg (2 mg Intravenous Given 05/21/20 1940)    ED Course  I have reviewed the triage vital signs and the nursing notes.  Pertinent labs & imaging results that were available during my care of the patient were reviewed by me and considered in my medical decision making (see chart for details).  29 year old female presents for evaluation of seizures.  Apparently 3 seizures prior to arrival.  Patient post ictal, agitated on arrival.  No lateral gaze, moves all 4 extremities at difficulty.  Heart and lungs clear however she is  tachycardic.  No meningismus on exam.  Does have various contusions and abrasions from fall from witnessed seizure.  Protecting airway.  Given 7.5 total Versed by EMS for seizures.  Unable to provide history.  Per chart review seems patient has been admitted previously for seizures.  Has history of alcohol use, substance use as well as seizure disorder.  Unsure if seizures today are due to polypharmacy, withdrawal, medication noncompliance, seizure disorder, metabolic derangement, intracranial abnormality.  Plan on labs, imaging. Initially tachycardic however combative. HR improved to 80's when patient settled. Does not appear septic at this time.   Consult with Dr. Londell Moh with Neurology.  Recommends bolus of Keppra, fluids, labs.  Discussed patient with likely admission given 3 seizures. Request reconsult for once admitted from hospitalist.  Patient reassessed.  Continues to be extremely agitated.  Requiring additional Ativan.  Patient reassessed.  She is more alert however significantly less agitated after Ativan.  She moves all 4 extremities without difficulty.  She denies any pain.  Admits to polysubstance use however declines to state when she is used.  States she does drink alcohol however unknown last use and denies prior history of DTs or withdrawal seizures.  Does not take any medications for seizures.  When asked initially she had history of seizure she said no however when I brought that she had been admitted previously for seizures she said "uhh I guess"   Delay in labs secondary to patient agitation. Missing #60 Klonopin 0.5 from recently filled bottle on 05/15/20.   Patient reassessed. Intermittently agitated. Father here in ED. Has no contact with her.  States [place] streets.  She has a history of substance however father states this is more pills.  He does not know of any IV drug use.  States she does have a history of heavy alcohol use.  Be admitted for further work-up of  seizures in setting of vasogenic edema, possible mass.  Does not appear septic at this time however blood cultures are pending.  Patient critically ill and will be admitted for further evaluation.  Question mass versus infection process?  CONSULT with NSGY Viona Gilmore who request consult once MRI resulted if mass.  CONSULT with Dr. Leonel Ramsay with Neurology who will evaluate patient.  CONSULT with Dr. Cyd Silence with [facility] who agrees to evaluate patient for admission.  The patient appears reasonably stabilized for admission considering the current resources, flow, and capabilities  available in the ED at this time, and I doubt any other [facility] requiring further screening and/or treatment in the ED prior to admission.   Patient seen and evaluated by attending Dr. Alvino Chapel who agrees with above treatment, plan and disposition.    MDM Rules/Calculators/A&P                           Final Clinical Impression(s) / ED Diagnoses Final diagnoses:  Seizure [facility])  Substance use  Vasogenic edema [facility])    Rx / DC Orders ED Discharge Orders    None       Atarah Cadogan A, PA-C 05/21/20 2127    Davonna Belling, MD 05/21/20 2310

## 2020-05-21 NOTE — H&P (Addendum)
History and Physical    Sonya Prince EHU:[id number] DOB: 10-29-1991 DOA: 05/21/2020  PCP: Patient, No Pcp Per  Patient coming from:    Chief Complaint:  Chief Complaint  Patient presents with   Seizures     HPI:    29 year old female with past medical history of PTSD, ADHD, seizure disorder, childhood meningitis, nicotine dependence, bipolar disorder who presents to [facility] emergency department via EMS after having several episodes of observed grand mal seizure activity in public.  Patient is an extremely poor historian and is currently confused and agitated and unwilling to participate in providing history.  History here is a combination of discussion with emergency department staff as well as discussion with the patient's father.  According the patient's father, the patient is currently homeless, suffers from bipolar disorder and does not maintain a relationship with him.  Of note, patient was hospitalized at [facility] in [place] in August with a similar presentation of multiple episodes of seizure-like activity.  During the hospitalization CT head and EEG were both unremarkable.  Patient was discharged on Zonisamide and advised to follow-up with neurology in the outpatient setting at that time.  Patient presents to [facility] emergency department after being observed to have an episode of grand mal seizure activity in public.  EMS had arrived and administered 5 mg of intravenous Versed.  In route to [facility], the patient exhibited another bout of seizure activity and was administered another 2.5 mg of Versed.  Upon arrival to the emergency department patient has been extremely agitated and confused and has received a total of 6 mg of intravenous Ativan by the emergency department staff.  Case was also briefly discussed with the daytime neurologist who recommended 1500 mg of Keppra on arrival.  Upon initial evaluation CT imaging of the head has been found  to reveal suspected vasogenic edema in the left temporal, parietal and occipital lobes suspicious for underlying mass with evidence of regional mass-effect and effacement of the left lateral ventricle with prominence of the left temporal horn.  MRI was recommended by radiology.  Case was then discussed with Viona Gilmore neurosurgery.  Dr. Leonel Ramsay with neurology was also notified and stated that he will evaluate the patient and leave a note.  The hospitalist group has now been called to assess the patient for admission the hospital.   Review of Systems: Unable to obtain due to confusion and agitation.  Past Medical History:  Diagnosis Date   Anxiety    Meningitis    PTSD (post-traumatic stress disorder)     Past Surgical History:  Procedure Laterality Date   OPEN REDUCTION INTERNAL FIXATION (ORIF) METACARPAL Right 12/19/2019   Procedure: OPEN TREATMENT OF RIGHT 5TH METACARPAL FRACTURE;  Surgeon: Milly Jakob, MD;  Location: [place];  Service: Orthopedics;  Laterality: Right;  WITH PREOP REGIONAL BLOCK   WISDOM TOOTH EXTRACTION       reports that she has been smoking. She has never used smokeless tobacco. She reports current alcohol use. She reports current drug use. Drug: Marijuana.  Allergies  Allergen Reactions   Doxycycline Swelling    Family History  Family history unknown: Yes     Prior to Admission medications   Medication Sig Start Date End Date Taking? Authorizing Provider  acetaminophen (TYLENOL) 325 MG tablet Take 2 tablets (650 mg total) by mouth every 6 (six) hours. 12/17/19   Milly Jakob, MD  amphetamine-dextroamphetamine (ADDERALL) 20 MG tablet Take 20 mg by mouth.  [provider]  clonazePAM (KLONOPIN) 0.5 MG tablet Take 0.5 mg by mouth.    [provider]  ibuprofen (ADVIL) 200 MG tablet Take 3 tablets (600 mg total) by mouth every 6 (six) hours. 12/17/19   Milly Jakob, MD  oxyCODONE (ROXICODONE) 5 MG  immediate release tablet Take 1 tablet (5 mg total) by mouth every 6 (six) hours as needed for severe pain. 12/17/19   Milly Jakob, MD    Physical Exam: Vitals:   05/21/20 1500 05/21/20 1737 05/21/20 2020  BP: (!) 101/51  107/70  Pulse: (!) 126  83  Resp: 18  16  Temp:  98.6 F (37 C) 98.3 F (36.8 C)  TempSrc:  Axillary Oral  SpO2: 93%  99%    Constitutional: Lethargic, with periods of agitation and combativeness when attempting to arouse.  Disoriented.  Not cooperating with exam. Skin: no rashes, no lesions, good skin turgor noted. Eyes: Pupils are equally reactive to light.  No evidence of scleral icterus or conjunctival pallor.  ENMT: Moist mucous membranes noted.   Neck: normal, supple, no masses, no thyromegaly.  No evidence of jugular venous distension.   Respiratory: clear to auscultation bilaterally, no wheezing, no crackles. Normal respiratory effort. No accessory muscle use.  Cardiovascular: Regular rate and rhythm, no murmurs / rubs / gallops. No extremity edema. 2+ pedal pulses. No carotid bruits.  Chest:   Nontender without crepitus or deformity.   Back:   Nontender without crepitus or deformity. Abdomen: Abdomen is soft and nontender.  No evidence of intra-abdominal masses.  Positive bowel sounds noted in all quadrants.   Musculoskeletal: No joint deformity upper and lower extremities. Good ROM, no contractures. Normal muscle tone.  Neurologic: Patient is moving all 4 extremities spontaneously.  Patient is extremely lethargic and when aroused becomes intermittently agitated and sometimes combative.  Patient has not participating in examination.  Patient is mainly responsive to painful stimuli at this point and does localize to pain.   Psychiatric: Unable to assess due to altered mentation, patient currently does not seem to possess insight as to her current situation.    Labs on Admission: I have personally reviewed following labs and imaging studies -    CBC: Recent Labs  Lab 05/21/20 1722  WBC 13.8*  NEUTROABS 11.8*  HGB 14.2  HCT 42.8  MCV 94.9  PLT 595   Basic Metabolic Panel: Recent Labs  Lab 05/21/20 1722  NA 140  K 3.6  CL 106  CO2 25  GLUCOSE 110*  BUN 8  CREATININE 0.89  CALCIUM 9.4  MG 2.2   GFR: CrCl cannot be calculated (Unknown ideal weight.). Liver Function Tests: Recent Labs  Lab 05/21/20 1722  AST 28  ALT 19  ALKPHOS 53  BILITOT 0.7  PROT 7.4  ALBUMIN 4.2   Recent Labs  Lab 05/21/20 1722  LIPASE 19   No results for input(s): AMMONIA in the last 168 hours. Coagulation Profile: No results for input(s): INR, PROTIME in the last 168 hours. Cardiac Enzymes: No results for input(s): CKTOTAL, CKMB, CKMBINDEX, TROPONINI in the last 168 hours. BNP (last 3 results) No results for input(s): PROBNP in the last 8760 hours. HbA1C: No results for input(s): HGBA1C in the last 72 hours. CBG: No results for input(s): GLUCAP in the last 168 hours. Lipid Profile: No results for input(s): CHOL, HDL, LDLCALC, TRIG, CHOLHDL, LDLDIRECT in the last 72 hours. Thyroid Function Tests: No results for input(s): TSH, T4TOTAL, FREET4, T3FREE, THYROIDAB in the last 72  hours. Anemia Panel: No results for input(s): VITAMINB12, FOLATE, FERRITIN, TIBC, IRON, RETICCTPCT in the last 72 hours. Urine analysis:    Component Value Date/Time   COLORURINE YELLOW 03/12/2019 0842   APPEARANCEUR HAZY (A) 03/12/2019 0842   LABSPEC 1.020 03/12/2019 0842   PHURINE 5.0 03/12/2019 0842   GLUCOSEU NEGATIVE 03/12/2019 0842   HGBUR NEGATIVE 03/12/2019 0842   BILIRUBINUR NEGATIVE 03/12/2019 0842   KETONESUR NEGATIVE 03/12/2019 0842   PROTEINUR NEGATIVE 03/12/2019 0842   NITRITE NEGATIVE 03/12/2019 0842   LEUKOCYTESUR SMALL (A) 03/12/2019 0842    Radiological Exams on Admission - Personally Reviewed: CT Head Wo Contrast  Result Date: 05/21/2020 CLINICAL DATA:  Altered mental status (AMS), unclear cause. Neck trauma,  uncomplicated. Additional history provided: Altered mental status, neck trauma, witnessed seizure while walking, reported to have fallen foward onto face. EXAM: CT HEAD WITHOUT CONTRAST CT CERVICAL SPINE WITHOUT CONTRAST TECHNIQUE: Multidetector CT imaging of the head and cervical spine was performed following the standard protocol without intravenous contrast. Multiplanar CT image reconstructions of the cervical spine were also generated. COMPARISON:  Prior head CT examinations 08/31/2018 and earlier FINDINGS: CT HEAD FINDINGS Brain: There is edema within the left temporal stem and within portions of the left temporal, parietal and occipital lobes. This edema is predominantly located within the white matter and likely reflects vasogenic edema. Redemonstrated chronic subcentimeter focus of calcification within the left temporal occipital lobes, as well as a small adjacent left tentorial calcification. There is associated regional mass effect. There is asymmetric prominence of the left temporal horn, likely reflecting some degree of ventricular entrapment. There is no acute intracranial hemorrhage. No extra-axial fluid collection. No midline shift. Vascular: No hyperdense vessel. Skull: Normal. Negative for fracture or focal lesion. Sinuses/Orbits: Visualized orbits show no acute finding. No significant paranasal sinus disease or mastoid effusion at the imaged levels. CT CERVICAL SPINE FINDINGS Alignment: Straightening of the expected cervical lordosis. No significant spondylolisthesis Skull base and vertebrae: The basion-dental and atlanto-dental intervals are maintained.No evidence of acute fracture to the cervical spine. Soft tissues and spinal canal: No prevertebral fluid or swelling. No visible canal hematoma. Disc levels: No significant bony spinal canal or neural foraminal narrowing at any level. Upper chest: No visible airspace consolidation or pneumothorax within the imaged lung apices. These results were  called by telephone at the time of interpretation on 05/21/2020 at 6:34 pm to provider [facility] , who verbally acknowledged these results. IMPRESSION: CT head: 1. There is edema which appears predominant ly vasogenic within portions of the left temporal, parietal and occipital lobes. Findings are most suspicious for an underlying mass. Contrast-enhanced brain MRI is recommended for further characterization. Redemonstrated small chronic parenchymal and tentorial calcifications in this region. 2. Regional mass effect with partial effacement of the left lateral ventricle. Associated prominence of the left temporal horn likely reflecting ventricular entrapment. CT cervical spine: No evidence of acute fracture to the cervical spine. Electronically Signed   By: Kellie Simmering DO   On: 05/21/2020 18:35   CT Cervical Spine Wo Contrast  Result Date: 05/21/2020 CLINICAL DATA:  Altered mental status (AMS), unclear cause. Neck trauma, uncomplicated. Additional history provided: Altered mental status, neck trauma, witnessed seizure while walking, reported to have fallen foward onto face. EXAM: CT HEAD WITHOUT CONTRAST CT CERVICAL SPINE WITHOUT CONTRAST TECHNIQUE: Multidetector CT imaging of the head and cervical spine was performed following the standard protocol without intravenous contrast. Multiplanar CT image reconstructions of the cervical spine were also generated. COMPARISON:  Prior head CT examinations 08/31/2018 and earlier FINDINGS: CT HEAD FINDINGS Brain: There is edema within the left temporal stem and within portions of the left temporal, parietal and occipital lobes. This edema is predominantly located within the white matter and likely reflects vasogenic edema. Redemonstrated chronic subcentimeter focus of calcification within the left temporal occipital lobes, as well as a small adjacent left tentorial calcification. There is associated regional mass effect. There is asymmetric prominence of the left  temporal horn, likely reflecting some degree of ventricular entrapment. There is no acute intracranial hemorrhage. No extra-axial fluid collection. No midline shift. Vascular: No hyperdense vessel. Skull: Normal. Negative for fracture or focal lesion. Sinuses/Orbits: Visualized orbits show no acute finding. No significant paranasal sinus disease or mastoid effusion at the imaged levels. CT CERVICAL SPINE FINDINGS Alignment: Straightening of the expected cervical lordosis. No significant spondylolisthesis Skull base and vertebrae: The basion-dental and atlanto-dental intervals are maintained.No evidence of acute fracture to the cervical spine. Soft tissues and spinal canal: No prevertebral fluid or swelling. No visible canal hematoma. Disc levels: No significant bony spinal canal or neural foraminal narrowing at any level. Upper chest: No visible airspace consolidation or pneumothorax within the imaged lung apices. These results were called by telephone at the time of interpretation on 05/21/2020 at 6:34 pm to provider [facility] , who verbally acknowledged these results. IMPRESSION: CT head: 1. There is edema which appears predominant ly vasogenic within portions of the left temporal, parietal and occipital lobes. Findings are most suspicious for an underlying mass. Contrast-enhanced brain MRI is recommended for further characterization. Redemonstrated small chronic parenchymal and tentorial calcifications in this region. 2. Regional mass effect with partial effacement of the left lateral ventricle. Associated prominence of the left temporal horn likely reflecting ventricular entrapment. CT cervical spine: No evidence of acute fracture to the cervical spine. Electronically Signed   By: Kellie Simmering DO   On: 05/21/2020 18:35   DG Chest Portable 1 View  Result Date: 05/21/2020 CLINICAL DATA:  Seizure, altered mental status. EXAM: PORTABLE CHEST 1 VIEW COMPARISON:  None. FINDINGS: The lungs are hyperinflated.  There is no evidence of acute infiltrate, pleural effusion or pneumothorax. The heart size and mediastinal contours are within normal limits. The visualized skeletal structures are unremarkable. IMPRESSION: No active disease. Electronically Signed   By: Virgina Norfolk M.D.   On: 05/21/2020 16:26    EKG: Personally reviewed.  Rhythm is tachycardia with heart rate of 111 bpm.  No dynamic ST segment changes appreciated.  Assessment/Plan Principal Problem:   Grand mal seizure [facility])   Patient presenting with several episodes of grand mal seizure, one in public with another witnessed by EMS according to emergency department staff.  Patient has not exhibited any seizure-like activity while here in the emergency department.  Patient was hospitalized at [facility] in [place] in August with a similar presentation of multiple episodes of seizure-like activity.  During the hospitalization CT head and EEG were both unremarkable.  Patient was discharged on Zonisamide and advised to follow-up with neurology in the outpatient setting at that time.  Is suspected the patient is not taking the zonisamide  There are multiple possible etiologies for seizure activity in this patient.  First and foremost, CT imaging is concerning for underlying mass with surrounding vasogenic edema.  Additionally, patient seems to exhibit noncompliance with seizure medication and has a history of suspected polysubstance abuse.  Additionally according to the controlled substance database patient has been prescribed 120 tablets of clonazepam  on 6/15.  She currently only possesses 60 of those tablets after only 6 days.  I am uncertain as to whether the patient is actually been taking this medication.  Abrupt discontinuation of this medication may also be the cause of seizure.  If MRI does indeed identify a mass, will notify Viona Gilmore with Neurosurgery as she has requested.  According to emergency department staff,  neurosurgery has recommended holding off on steroids for now.  Case already been discussed with Dr. Leonel Ramsay by the emergency department staff who will come see the patient this evening and leave a note with further recommendations.  1500 of intravenous Keppra and a total of 6 mg of Ativan have already been administered while here in the emergency department.  I will abstain from any further scheduled dosing of these medications until neurology places recommendations.  Their input is appreciated.  As needed intravenous Ativan for further seizure activity.  Admission to stepdown unit.    Vasogenic brain edema ([place])   Please see assessment and plan above.    Toxic metabolic encephalopathy  Patient has been quite lethargic with bouts of agitation here in the emergency department is not participating in her treatment plan.  Is possible that this may be secondary to her underlying history of psychiatric illness, possibly secondary to an underlying brain tumor.  Considering patient's history of suspected polysubstance abuse, toxic encephalopathy is also possible.  Additionally obtaining TSH, vitamin B12 and folate.  Hydrate patient with intravenous fluids  Await work-up, monitor for clinical improvement.    Code Status:  Full code Family Communication: Discussed patient's case with father via phone conversation who has been updated on plan of care.  Status is: Observation  The patient remains OBS appropriate and will d/c before 2 midnights.  Dispo: The patient is from: Home              Anticipated d/c is to: Home              Anticipated d/c date is: 2 days              Patient currently is not medically stable to d/c.        Vernelle Emerald MD [facility] Pager [phone]  If 7PM-7AM, please contact night-coverage [email or web address] Use universal [place] password for that web site. If you do not have the password, please call the hospital  operator.  05/21/2020, 8:44 PM

## 2020-05-21 NOTE — ED Notes (Signed)
Admitting Provider made aware of pending UA, UDS, Blood Specimens, and MRI. Per MD, due to previous medication administration, pt cannot be medicated to complete tasks and collect specimens and complete scans. This RN will reassess at a later time if pt is agreeable to tasks.

## 2020-05-21 NOTE — Consult Note (Signed)
Neurology Consultation Reason for Consult: Seizures Referring Physician: Alvino Chapel, N  CC: Seizures  History is obtained from: Chart review  HPI: Sonya Prince is a 29 y.o. female with a history of meningitis as a child, PTSD, previous seizures with some question of drugs of abuse being involved, who presents with seizure activity.  EMS was called for seizures, and she had additional seizures witnessed by EMS who gave a total of 7.5 of Versed.  On arrival, she was confused and combative, but following commands.  She seen for head CT which demonstrates calcifications which have been seen previously, however there is also a slightly hyperdense lesion with surrounding edema in the left parietal region.  She also had a seizure in August 2020, at that time she had a CT but no MRI.  She was started on Zonegran, but has been noncompliant.   ROS:  Unable to obtain due to altered mental status.   Past Medical History:  Diagnosis Date  . Anxiety   . Meningitis   . PTSD (post-traumatic stress disorder)      Family History  Family history unknown: Yes     Social History:  reports that she has been smoking. She has never used smokeless tobacco. She reports current alcohol use. She reports current drug use. Drug: Marijuana.   Exam: Current vital signs: BP 107/70 (BP Location: Right Arm)   Pulse 83   Temp 98.3 F (36.8 C) (Oral)   Resp 16   SpO2 99%  Vital signs in last 24 hours: Temp:  [98.3 F (36.8 C)-98.6 F (37 C)] 98.3 F (36.8 C) (06/21 2020) Pulse Rate:  [83-126] 83 (06/21 2020) Resp:  [16-18] 16 (06/21 2020) BP: (101-107)/(51-70) 107/70 (06/21 2020) SpO2:  [93 %-99 %] 99 % (06/21 2020)   Physical Exam  Constitutional: Appears well-developed and well-nourished.  Psych: Not cooperative Eyes: No scleral injection HENT: No OP obstrucion MSK: no joint deformities.  Cardiovascular: Normal rate and regular rhythm.  Respiratory: Effort normal, non-labored breathing GI:  Soft.  No distension. There is no tenderness.  Skin: WDI  Neuro: Mental Status: Patient is awake, alert, she is able to tell me her name and answer some simple questions, however she becomes progressively less cooperative, refusing to answer other orientation questions. Cranial Nerves: II: Visual Fields are full. Pupils are equal, round, and reactive to light.   III,IV, VI: EOMI without ptosis or diploplia.  V: Facial sensation is symmetric to temperature VII: Facial movement is symmetric.  VIII: hearing is intact to voice X: Uvula elevates symmetrically XII: tongue is midline without atrophy or fasciculations.  Motor: Though she does not cooperate with formal strength testing, she does follow commands to wiggle fingers and toes bilaterally.   Sensory: She responds to stimuli in all four extremities  Cerebellar: Does not perform    I have reviewed labs in epic and the results pertinent to this consultation are: CMP-unremarkable Mild leukocytosis   I have reviewed the images obtained: CT head-area of edema in the left parietal region  Impression: 29 year old female with recurrent seizures with abnormal head CT.  The appearance is nonspecific and could be tumor, tumefactive MS, abscess, among other possible etiologies.  Further imaging with MRI is indicated.  The fact that she did have a seizure in April, as well as the fact that she is afebrile would be suggestive of a slower process rather than infection.    She has been loaded with Keppra, but given her history of bipolar disorder,  other agents such as Depakote or Lamictal would be preferable.  Given that she has had multiple seizures, I think the slow titration required for Lamictal would make Depakote preferable.  Recommendations:  1) MRI brain w/wo contrast 2) Depakote 1.5g x 1, then 500mg  BID 3) EEG in the AM.    Roland Rack, MD [facility] [phone]  If 7pm- 7am, please page neurology on call  as listed in [place].

## 2020-05-21 NOTE — ED Triage Notes (Signed)
Pt had a witnessed seizure while walking downtown . Pt was reported to have fallen forward onto face . Pt has abrasions to knuckles and knee. Pt sleepy on arrival to ED. EMS vitials HR 140, BP 104/60, SPO2 90% on room air and 98% on 2 liters nasal. EMS gave 5mg  of versed IM then 2.5 mg same IV.

## 2020-05-22 ENCOUNTER — Observation Stay (HOSPITAL_COMMUNITY): Payer: Self-pay

## 2020-05-22 LAB — TSH: TSH: 0.126 u[IU]/mL — ABNORMAL LOW (ref 0.350–4.500)

## 2020-05-22 LAB — COMPREHENSIVE METABOLIC PANEL
ALT: 15 U/L (ref 0–44)
AST: 28 U/L (ref 15–41)
Albumin: 3.4 g/dL — ABNORMAL LOW (ref 3.5–5.0)
Alkaline Phosphatase: 42 U/L (ref 38–126)
Anion gap: 7 (ref 5–15)
BUN: 8 mg/dL (ref 6–20)
CO2: 21 mmol/L — ABNORMAL LOW (ref 22–32)
Calcium: 8.6 mg/dL — ABNORMAL LOW (ref 8.9–10.3)
Chloride: 113 mmol/L — ABNORMAL HIGH (ref 98–111)
Creatinine, Ser: 0.81 mg/dL (ref 0.44–1.00)
GFR calc Af Amer: >60 mL/min (ref >60)
GFR calc non Af Amer: >60 mL/min (ref >60)
Glucose, Bld: 92 mg/dL (ref 70–99)
Potassium: 4.1 mmol/L (ref 3.5–5.1)
Sodium: 141 mmol/L (ref 135–145)
Total Bilirubin: 0.7 mg/dL (ref 0.3–1.2)
Total Protein: 6 g/dL — ABNORMAL LOW (ref 6.5–8.1)

## 2020-05-22 LAB — VITAMIN B12: Vitamin B-12: 428 pg/mL (ref 180–914)

## 2020-05-22 LAB — CBC WITH DIFFERENTIAL/PLATELET
Abs Immature Granulocytes: 0.02 10*3/uL (ref 0.00–0.07)
Basophils Absolute: 0.1 10*3/uL (ref 0.0–0.1)
Basophils Relative: 1 %
Eosinophils Absolute: 0.1 10*3/uL (ref 0.0–0.5)
Eosinophils Relative: 1 %
HCT: 39.5 % (ref 36.0–46.0)
Hemoglobin: 13 g/dL (ref 12.0–15.0)
Immature Granulocytes: 0 %
Lymphocytes Relative: 32 %
Lymphs Abs: 3.2 10*3/uL (ref 0.7–4.0)
MCH: 31.9 pg (ref 26.0–34.0)
MCHC: 32.9 g/dL (ref 30.0–36.0)
MCV: 97.1 fL (ref 80.0–100.0)
Monocytes Absolute: 0.7 10*3/uL (ref 0.1–1.0)
Monocytes Relative: 8 %
Neutro Abs: 5.7 10*3/uL (ref 1.7–7.7)
Neutrophils Relative %: 58 %
Platelets: 176 10*3/uL (ref 150–400)
RBC: 4.07 MIL/uL (ref 3.87–5.11)
RDW: 11.3 % — ABNORMAL LOW (ref 11.5–15.5)
WBC: 9.8 10*3/uL (ref 4.0–10.5)
nRBC: 0 % (ref 0.0–0.2)

## 2020-05-22 LAB — RPR: RPR Ser Ql: NONREACTIVE

## 2020-05-22 LAB — FOLATE: Folate: 20 ng/mL (ref >5.9)

## 2020-05-22 LAB — MAGNESIUM: Magnesium: 2.1 mg/dL (ref 1.7–2.4)

## 2020-05-22 LAB — T4, FREE: Free T4: 1.06 ng/dL (ref 0.61–1.12)

## 2020-05-22 LAB — HIV ANTIBODY (ROUTINE TESTING W REFLEX): HIV Screen 4th Generation wRfx: NONREACTIVE

## 2020-05-22 IMAGING — MR MR HEAD WO/W CM
10 of 16 series · 31 of 48 positions shown · IV contrast (Yes   GAD)
Comparison: Correlation made with prior head CT

CLINICAL DATA: Abnormal CT head

EXAM:
MRI HEAD WITHOUT AND WITH CONTRAST
TECHNIQUE: Multiplanar, multiecho pulse sequences of the brain and surrounding
structures were obtained without and with intravenous contrast.
CONTRAST:  5mL GADAVIST GADOBUTROL 1 MMOL/ML IV SOLN

[Series 3: DWI · axial · 3.0mm · 1.09mm/px · z∈[-91,+66]mm · 7 of 108 slices shown (1 of 4)]
[im 1/108]
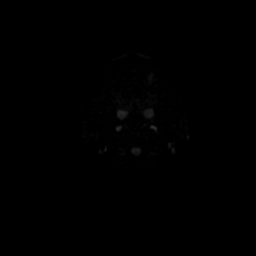
[im 18/108]
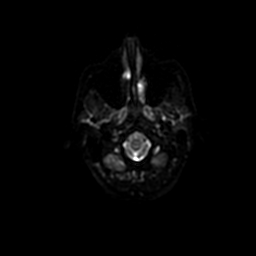
[im 36/108]
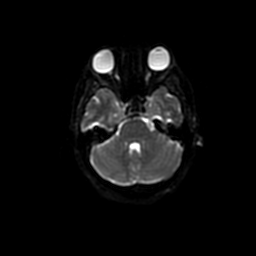
[im 54/108]
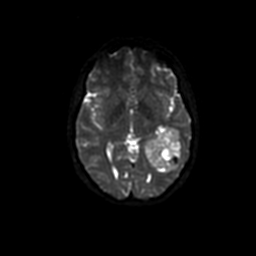
[im 72/108]
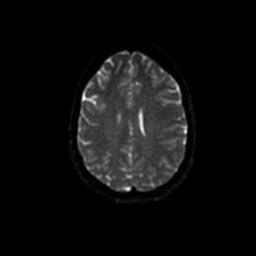
[im 90/108]
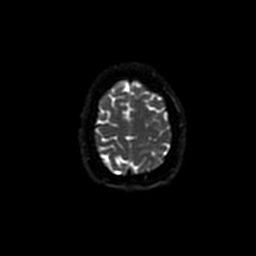
[im 108/108]
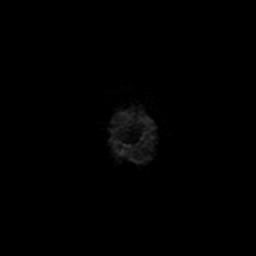

[Series 5: DWI · coronal · 5.0mm · 1.09mm/px · 5 of 76 slices shown (2 of 4)]
[im 1/76]
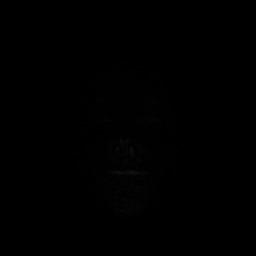
[im 19/76]
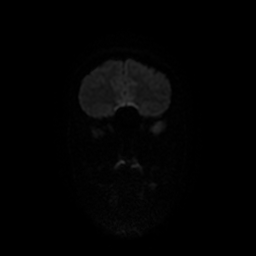
[im 38/76]
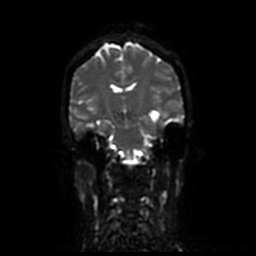
[im 57/76]
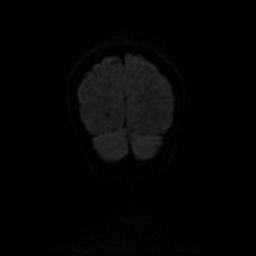
[im 76/76]
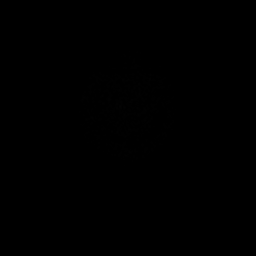

[Series 8: T1 post-contrast · axial · 3.0mm · 0.43mm/px · z∈[-83,+69]mm · 4 of 52 slices shown (1 of 3)]
[im 1/52]
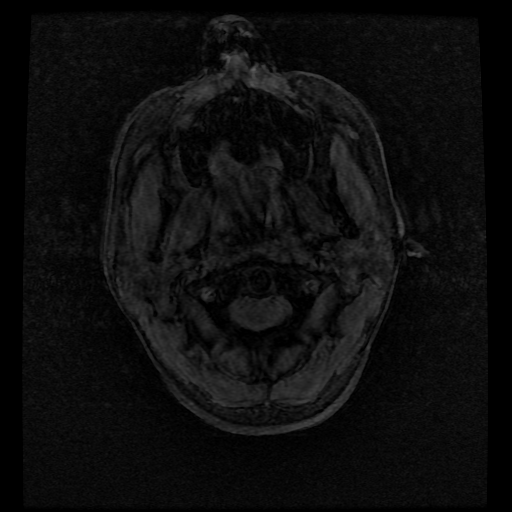
[im 18/52]
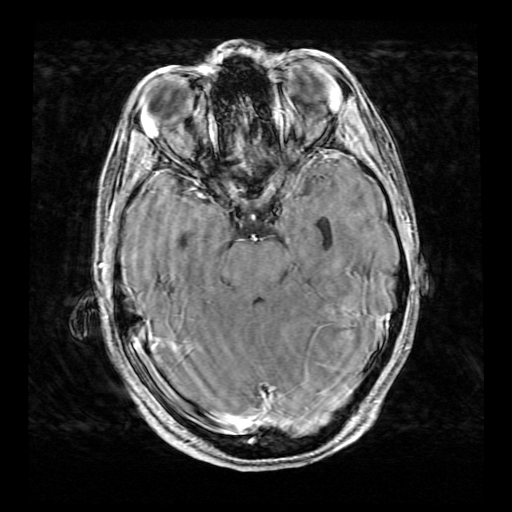
[im 35/52]
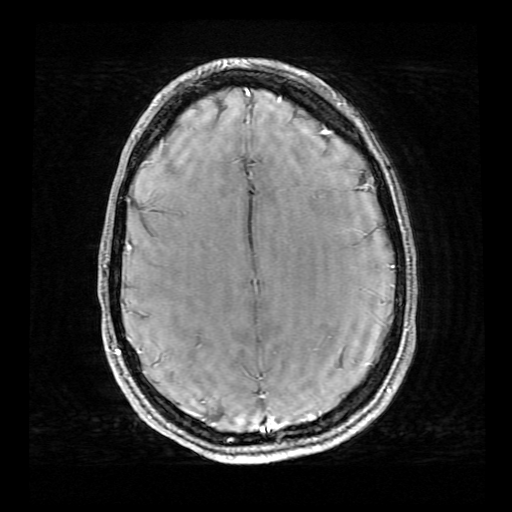
[im 52/52]
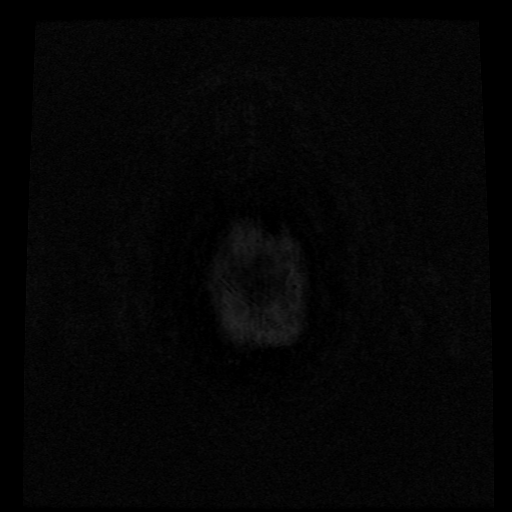

[Series 9: T1 post-contrast · coronal · 5.0mm · 0.43mm/px · 2 of 25 slices shown (2 of 3)]
[im 1/25]
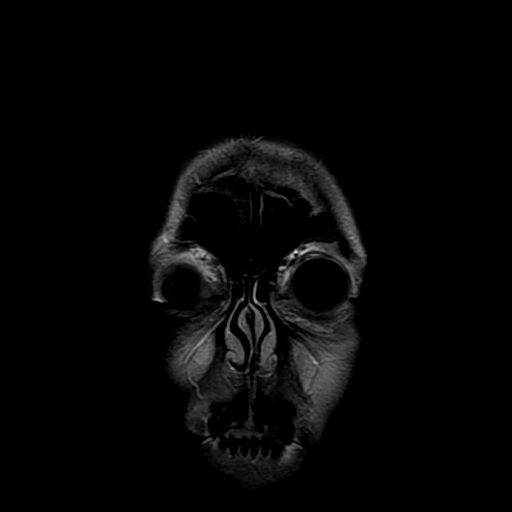
[im 25/25]
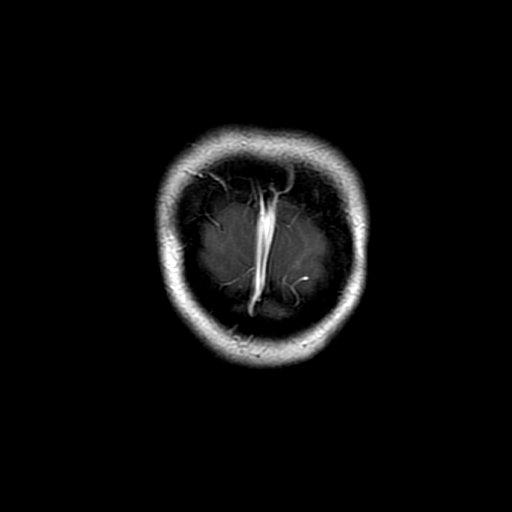

[Series 10: T1 post-contrast · sagittal · 5.0mm · 0.47mm/px · 2 of 23 slices shown (3 of 3)]
[im 1/23]
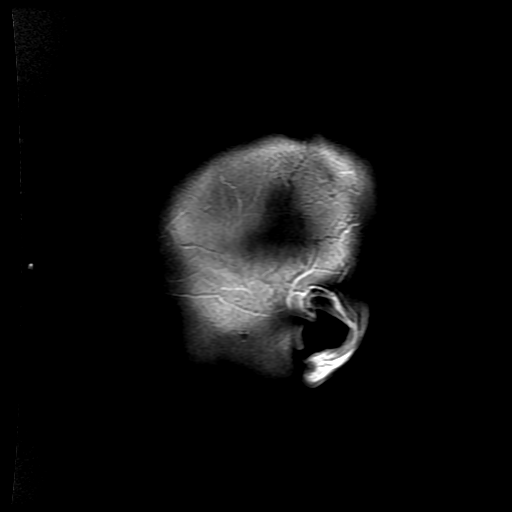
[im 23/23]
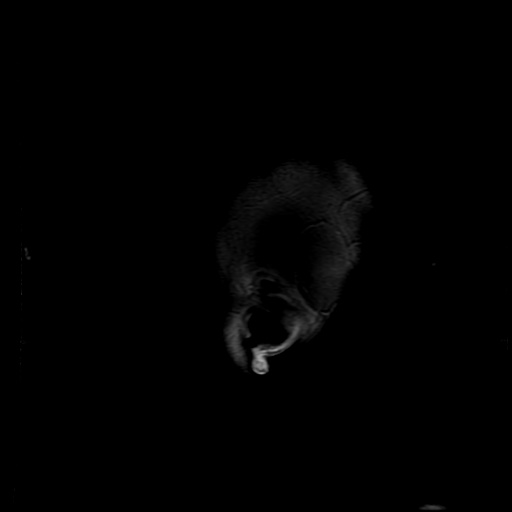

[Series 11: FLAIR · axial · 3.0mm · 0.43mm/px · z∈[-86,+56]mm · 2 of 25 slices shown (1 of 2)]
[im 1/25]
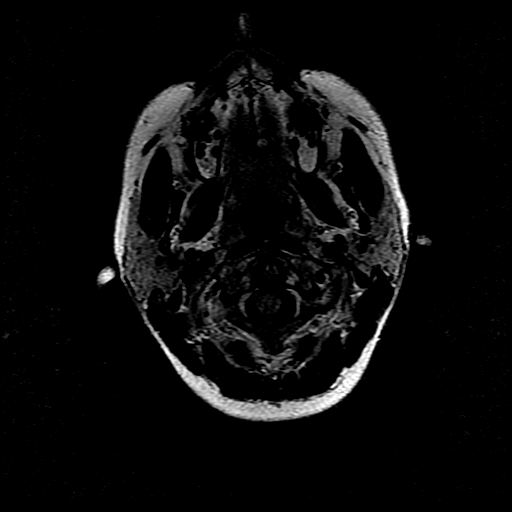
[im 25/25]
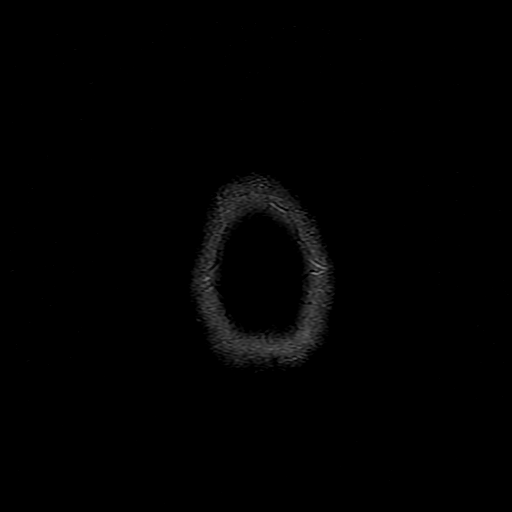

[Series 12: T2 · axial · 3.0mm · 0.43mm/px · 1 of 25 slices shown]
[im 1/25]
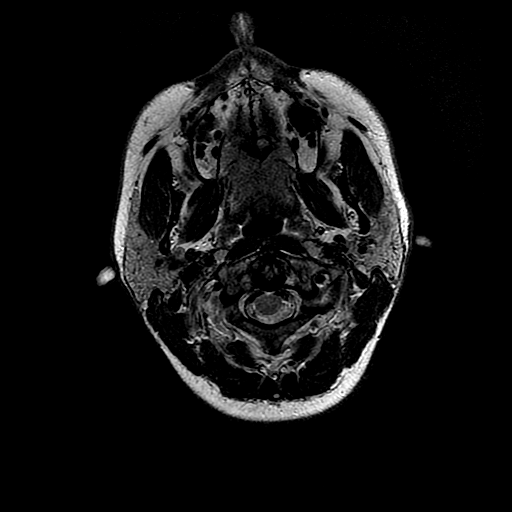

[Series 15: FLAIR · coronal · 3.0mm · 0.35mm/px · 2 of 32 slices shown (2 of 2)]
[im 1/32]
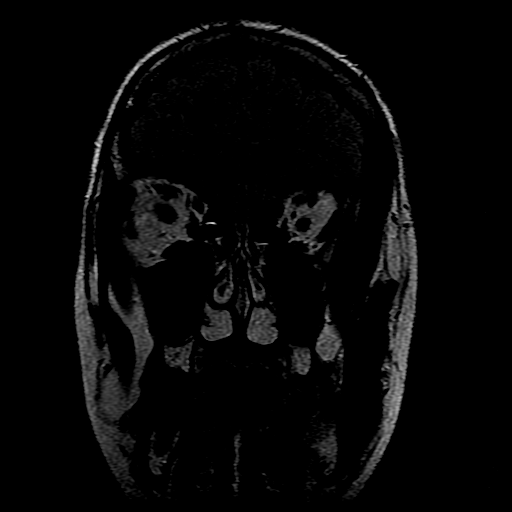
[im 32/32]
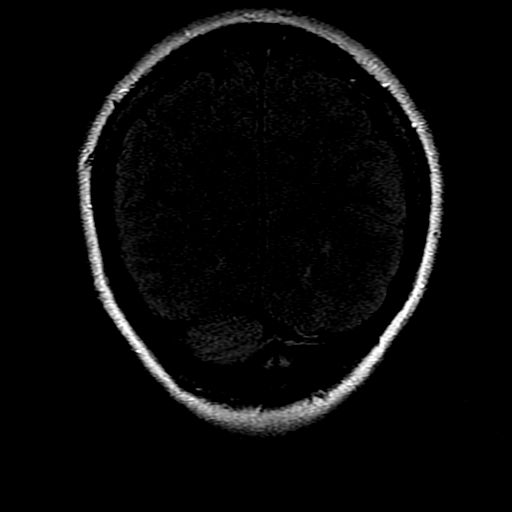

[Series 300: DWI · axial · 3.0mm · 1.09mm/px · z∈[-91,+66]mm · 4 of 54 slices shown (3 of 4)]
[im 1/54]
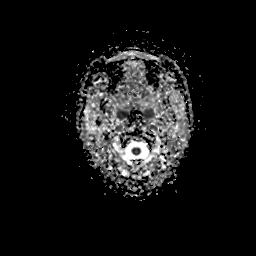
[im 18/54]
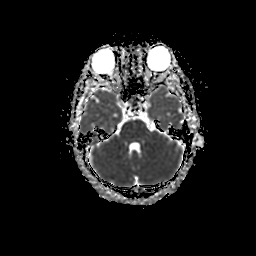
[im 36/54]
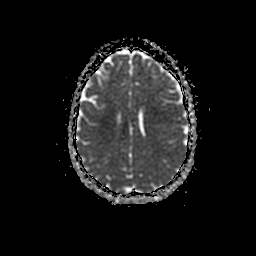
[im 54/54]
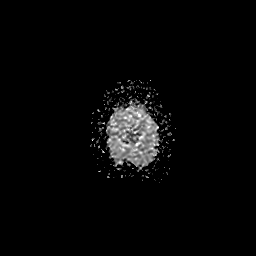

[Series 500: DWI · coronal · 5.0mm · 1.09mm/px · 2 of 35 slices shown (4 of 4)]
[im 1/35]
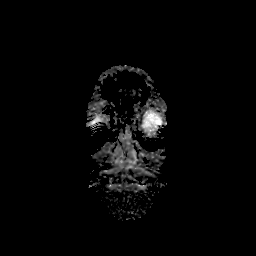
[im 35/35]
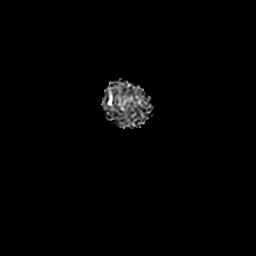

[31 of 48 positions shown; findings below may reference images not displayed]

FINDINGS: Brain: Heterogeneous, enhancing mass centered within the posterior
left temporal lobe measuring approximately 4.2 x 3.4 x 2.9 cm. Foci
of susceptibility are present corresponding to mineralization on CT.
There is minor surrounding edema. Regional mass effect is unchanged
with trapping of the left temporal horn.

No additional mass or abnormal enhancement. No acute infarction or
hemorrhage.

Vascular: Major vessel flow voids at the skull base are preserved.

Skull and upper cervical spine: Normal marrow signal.

Sinuses/Orbits: Minor mucosal thickening.  Orbits are unremarkable.

Other: Mastoid air cells clear.
IMPRESSION: Posterior left temporal lobe mass as described with mild trapping of
the left temporal horn. This likely reflects a slow growing primary
neoplasm, noting calcification was present on prior studies. Primary
differential considerations include ganglioglioma and
oligodendroglioma or uncommon location of pilocytic astrocytoma.

## 2020-05-22 MED ORDER — GADOBUTROL 1 MMOL/ML IV SOLN
5.0000 mL | Freq: Once | INTRAVENOUS | Status: AC | PRN
  Administered 2020-05-22: 5 mL via INTRAVENOUS

## 2020-05-22 MED ORDER — OXCARBAZEPINE 150 MG PO TABS: 150.0000 mg | ORAL_TABLET | Freq: Two times a day (BID) | ORAL | Status: DC

## 2020-05-22 MED ORDER — NICOTINE 21 MG/24HR TD PT24
21.0000 mg | MEDICATED_PATCH | Freq: Every day | TRANSDERMAL | Status: DC
  Administered 2020-05-22: 21 mg via TRANSDERMAL
  Filled 2020-05-22: qty 1

## 2020-05-22 MED ORDER — OXCARBAZEPINE 150 MG PO TABS
150.0000 mg | ORAL_TABLET | Freq: Two times a day (BID) | ORAL | Status: DC
  Filled 2020-05-22: qty 1

## 2020-05-22 MED ORDER — OXCARBAZEPINE 150 MG PO TABS: 150.0000 mg | ORAL_TABLET | Freq: Two times a day (BID) | ORAL | 0 refills | Status: DC

## 2020-05-22 NOTE — ED Notes (Signed)
MRI will call back when ready to re-scan patient.

## 2020-05-22 NOTE — ED Notes (Signed)
Patient laying in bed with eyes closed, refusing any care at this time.

## 2020-05-22 NOTE — ED Notes (Signed)
Family at bedside stated he is encouraging patient to stay.

## 2020-05-22 NOTE — ED Notes (Signed)
Patient in room, thrashing and cursing. Family at bedside. Patient removed IV; Notified Dr. Pietro Cassis.

## 2020-05-22 NOTE — ED Notes (Signed)
Patient up and being cooperative, walked patient to bathroom, patient stated she was not giving a urine sample because she did something a few days ago and don't want it in her hospital records.

## 2020-05-22 NOTE — Progress Notes (Signed)
EEG completed, results pending. 

## 2020-05-22 NOTE — ED Notes (Signed)
Explained to patient importance of MRI studies, patient verbalized understanding. Family member at bedside.

## 2020-05-22 NOTE — ED Notes (Signed)
Pt refusing blood work. Tech notified nurse.

## 2020-05-22 NOTE — ED Notes (Signed)
Taken to MRI. Patient stated she will stay still and refuse PRN medication at this time.

## 2020-05-22 NOTE — Consult Note (Signed)
Reason for Consult: Seizure, left parietal brain lesion Referring Physician: ER doctor  Sonya Prince is an 29 y.o. female.  HPI: The patient is a 29 year old white female with a history of seizure disorder, meningitis, posttraumatic stress disorder, bipolar disorder, substance abuse, etc.  By report she had 3 seizures yesterday.  She was brought to [facility], ER and worked up with a head CT which demonstrated left parietal brain calcifications with some surrounding edema.  Several attempts were made had getting a brain MRI but she would not lay still.  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  The patient had head CTs in November 2017, which I cannot see the image, and October 2019 which demonstrated calcifications in the same area.  By the radiologist report there is no change between the 2017 and 2019 scans indicating the calcifications were present in November 2017.  Presently the patient is somewhat uncooperative but will follow commands and answer questions.  She told me the reason she had a seizure is because the "therapist was reprogramming my memories".  Past Medical History:  Diagnosis Date  . Anxiety   . Meningitis   . PTSD (post-traumatic stress disorder)     Past Surgical History:  Procedure Laterality Date  . OPEN REDUCTION INTERNAL FIXATION (ORIF) METACARPAL Right 12/19/2019   Procedure: OPEN TREATMENT OF RIGHT 5TH METACARPAL FRACTURE;  Surgeon: Milly Jakob, MD;  Location: [facility];  Service: Orthopedics;  Laterality: Right;  WITH PREOP REGIONAL BLOCK  . WISDOM TOOTH EXTRACTION      Family History  Family history unknown: Yes    Social History:  reports that she has been smoking. She has never used smokeless tobacco. She reports current alcohol use. She reports current drug use. Drug: Marijuana.  Allergies:  Allergies  Allergen Reactions  . Doxycycline Swelling    Medications:  I have reviewed the patient's current  medications. Prior to Admission: (Not in a hospital admission)  Scheduled: . divalproex  500 mg Oral Q12H  . Tdap  0.5 mL Intramuscular Once   Continuous: . 0.9 % NaCl with KCl 20 mEq / L 100 mL/hr at 05/21/20 2238   JHE:RDEYCXKGYJEHU **OR** acetaminophen, LORazepam, ondansetron **OR** ondansetron (ZOFRAN) IV, polyethylene glycol Anti-infectives (From admission, onward)   None       Results for orders placed or performed during the hospital encounter of 05/21/20 (from the past 48 hour(s))  CBC with Differential     Status: Abnormal   Collection Time: 05/21/20  5:22 PM  Result Value Ref Range   WBC 13.8 (H) 4.0 - 10.5 K/uL   RBC 4.51 3.87 - 5.11 MIL/uL   Hemoglobin 14.2 12.0 - 15.0 g/dL   HCT 42.8 36 - 46 %   MCV 94.9 80.0 - 100.0 fL   MCH 31.5 26.0 - 34.0 pg   MCHC 33.2 30.0 - 36.0 g/dL   RDW 11.1 (L) 11.5 - 15.5 %   Platelets 203 150 - 400 K/uL   nRBC 0.0 0.0 - 0.2 %   Neutrophils Relative % 87 %   Neutro Abs 11.8 (H) 1.7 - 7.7 K/uL   Lymphocytes Relative 8 %   Lymphs Abs 1.1 0.7 - 4.0 K/uL   Monocytes Relative 5 %   Monocytes Absolute 0.7 0 - 1 K/uL   Eosinophils Relative 0 %   Eosinophils Absolute 0.0 0 - 0 K/uL   Basophils Relative 0 %   Basophils Absolute 0.0 0 - 0 K/uL   Immature  Granulocytes 0 %   Abs Immature Granulocytes 0.06 0.00 - 0.07 K/uL    Comment: Performed at [facility], [place] [address], [place], [place] [address]  Comprehensive metabolic panel     Status: Abnormal   Collection Time: 05/21/20  5:22 PM  Result Value Ref Range   Sodium 140 135 - 145 mmol/L   Potassium 3.6 3.5 - 5.1 mmol/L   Chloride 106 98 - 111 mmol/L   CO2 25 22 - 32 mmol/L   Glucose, Bld 110 (H) 70 - 99 mg/dL    Comment: Glucose reference range applies only to samples taken after fasting for at least 8 hours.   BUN 8 6 - 20 mg/dL   Creatinine, Ser 0.89 0.44 - 1.00 mg/dL   Calcium 9.4 8.9 - 10.3 mg/dL   Total Protein 7.4 6.5 - 8.1 g/dL   Albumin 4.2 3.5 - 5.0 g/dL    AST 28 15 - 41 U/L   ALT 19 0 - 44 U/L   Alkaline Phosphatase 53 38 - 126 U/L   Total Bilirubin 0.7 0.3 - 1.2 mg/dL   GFR calc non Af Amer >60 >60 mL/min   GFR calc Af Amer >60 >60 mL/min   Anion gap 9 5 - 15    Comment: Performed at [facility], [place] [address] [place], [place] [address]  Lipase, blood     Status: None   Collection Time: 05/21/20  5:22 PM  Result Value Ref Range   Lipase 19 11 - 51 U/L    Comment: Performed at [facility], [place] [address], [place], [place] [address]  Magnesium     Status: None   Collection Time: 05/21/20  5:22 PM  Result Value Ref Range   Magnesium 2.2 1.7 - 2.4 mg/dL    Comment: Performed at [facility], [place] [address] [place], [place] [address]  SARS Coronavirus 2 by RT PCR (hospital order, performed in [facility] hospital lab) Nasopharyngeal Nasopharyngeal Swab     Status: None   Collection Time: 05/21/20  5:24 PM   Specimen: Nasopharyngeal Swab  Result Value Ref Range   SARS Coronavirus 2 NEGATIVE NEGATIVE    Comment: (NOTE) SARS-CoV-2 target nucleic acids are NOT DETECTED.  The SARS-CoV-2 RNA is generally detectable in upper and lower respiratory specimens during the acute phase of infection. The lowest concentration of SARS-CoV-2 viral copies this assay can detect is 250 copies / mL. A negative result does not preclude SARS-CoV-2 infection and should not be used as the sole basis for treatment or other patient management decisions.  A negative result may occur with improper specimen collection / handling, submission of specimen other than nasopharyngeal swab, presence of viral mutation(s) within the areas targeted by this assay, and inadequate number of viral copies (<250 copies / mL). A negative result must be combined with clinical observations, patient history, and epidemiological information.  Fact Sheet for Patients:   StrictlyIdeas.no  Fact Sheet for Healthcare  Providers: BankingDealers.co.za  This test is not yet approved or  cleared by the [place] FDA and has been authorized for detection and/or diagnosis of SARS-CoV-2 by FDA under an Emergency Use Authorization (EUA).  This EUA will remain in effect (meaning this test can be used) for the duration of the COVID-19 declaration under Section 564(b)(1) of the Act, 21 U.S.C. section 360bbb-3(b)(1), unless the authorization is terminated or revoked sooner.  Performed at [facility], [place] [address]  [place], [place] [address]   Acetaminophen level     Status: Abnormal   Collection Time: 05/21/20  5:30 PM  Result Value Ref Range   Acetaminophen (Tylenol), Serum <10 (L) 10 - 30 ug/mL    Comment: (NOTE) Therapeutic concentrations vary significantly. A range of 10-30 ug/mL  may be an effective concentration for many patients. However, some  are best treated at concentrations outside of this range. Acetaminophen concentrations >150 ug/mL at 4 hours after ingestion  and >50 ug/mL at 12 hours after ingestion are often associated with  toxic reactions.  Performed at [facility], [place] [address] [place], [place] [address]   Salicylate level     Status: Abnormal   Collection Time: 05/21/20  5:30 PM  Result Value Ref Range   Salicylate Lvl <7.3 (L) 7.0 - 30.0 mg/dL    Comment: Performed at [facility] [address], [place], [place] [address]  Ethanol     Status: None   Collection Time: 05/21/20  5:30 PM  Result Value Ref Range   Alcohol, Ethyl (B) <10 <10 mg/dL    Comment: (NOTE) Lowest detectable limit for serum alcohol is 10 mg/dL.  For medical purposes only. Performed at [facility], [place] [address], [place], [place] [address]   Lactic acid, plasma     Status: None   Collection Time: 05/21/20  5:30 PM  Result Value Ref Range   Lactic Acid, Venous 1.6 0.5 - 1.9 mmol/L    Comment: Performed at [facility] [address] [place], [place] [address]  I-Stat Beta hCG blood, ED ([facility], [facility], [facility] only)     Status: None   Collection Time: 05/21/20  5:40 PM  Result Value Ref Range   I-stat hCG, quantitative <5.0 <5 mIU/mL   Comment 3            Comment:   GEST. AGE      CONC.  (mIU/mL)   <=1 WEEK        5 - 50     2 WEEKS       50 - 500     3 WEEKS       100 - 10,000     4 WEEKS     1,000 - 30,000        FEMALE AND NON-PREGNANT FEMALE:     LESS THAN 5 mIU/mL   CBG monitoring, ED     Status: None   Collection Time: 05/21/20  9:10 PM  Result Value Ref Range   Glucose-Capillary 88 70 - 99 mg/dL    Comment: Glucose reference range applies only to samples taken after fasting for at least 8 hours.  Magnesium     Status: None   Collection Time: 05/22/20  5:32 AM  Result Value Ref Range   Magnesium 2.1 1.7 - 2.4 mg/dL    Comment: Performed at [facility] [address] [place], [place] [address]  CBC WITH DIFFERENTIAL     Status: Abnormal   Collection Time: 05/22/20  5:32 AM  Result Value Ref Range   WBC 9.8 4.0 - 10.5 K/uL   RBC 4.07 3.87 - 5.11 MIL/uL   Hemoglobin 13.0 12.0 - 15.0 g/dL   HCT 39.5 36 - 46 %   MCV 97.1 80.0 - 100.0 fL   MCH 31.9 26.0 - 34.0 pg   MCHC 32.9 30.0 - 36.0 g/dL   RDW 11.3 (L) 11.5 - 15.5 %  Platelets 176 150 - 400 K/uL   nRBC 0.0 0.0 - 0.2 %   Neutrophils Relative % 58 %   Neutro Abs 5.7 1.7 - 7.7 K/uL   Lymphocytes Relative 32 %   Lymphs Abs 3.2 0.7 - 4.0 K/uL   Monocytes Relative 8 %   Monocytes Absolute 0.7 0 - 1 K/uL   Eosinophils Relative 1 %   Eosinophils Absolute 0.1 0 - 0 K/uL   Basophils Relative 1 %   Basophils Absolute 0.1 0 - 0 K/uL   Immature Granulocytes 0 %   Abs Immature Granulocytes 0.02 0.00 - 0.07 K/uL    Comment: Performed at [facility] [address] [place], [place] [address]  Comprehensive metabolic panel     Status: Abnormal   Collection Time: 05/22/20  5:32 AM  Result Value Ref Range   Sodium 141 135 - 145 mmol/L   Potassium 4.1  3.5 - 5.1 mmol/L   Chloride 113 (H) 98 - 111 mmol/L   CO2 21 (L) 22 - 32 mmol/L   Glucose, Bld 92 70 - 99 mg/dL    Comment: Glucose reference range applies only to samples taken after fasting for at least 8 hours.   BUN 8 6 - 20 mg/dL   Creatinine, Ser 0.81 0.44 - 1.00 mg/dL   Calcium 8.6 (L) 8.9 - 10.3 mg/dL   Total Protein 6.0 (L) 6.5 - 8.1 g/dL   Albumin 3.4 (L) 3.5 - 5.0 g/dL   AST 28 15 - 41 U/L   ALT 15 0 - 44 U/L   Alkaline Phosphatase 42 38 - 126 U/L   Total Bilirubin 0.7 0.3 - 1.2 mg/dL   GFR calc non Af Amer >60 >60 mL/min   GFR calc Af Amer >60 >60 mL/min   Anion gap 7 5 - 15    Comment: Performed at [facility] [address] [place], [place] [address]  TSH     Status: Abnormal   Collection Time: 05/22/20  5:32 AM  Result Value Ref Range   TSH 0.126 (L) 0.350 - 4.500 uIU/mL    Comment: Performed by a 3rd Generation assay with a functional sensitivity of <=0.01 uIU/mL. Performed at [facility], [place] [address], [place], [place] [address]   Folate     Status: None   Collection Time: 05/22/20  5:32 AM  Result Value Ref Range   Folate 20.0 >5.9 ng/mL    Comment: Performed at [facility] [address], [place], [place] [address]  Vitamin B12     Status: None   Collection Time: 05/22/20  5:32 AM  Result Value Ref Range   Vitamin B-12 428 180 - 914 pg/mL    Comment: (NOTE) This assay is not validated for testing neonatal or myeloproliferative syndrome specimens for Vitamin B12 levels. Performed at [facility], [place] [address], [place], [place] [address]     CT Head Wo Contrast  Result Date: 05/21/2020 CLINICAL DATA:  Altered mental status (AMS), unclear cause. Neck trauma, uncomplicated. Additional history provided: Altered mental status, neck trauma, witnessed seizure while walking, reported to have fallen foward onto face. EXAM: CT HEAD WITHOUT CONTRAST CT CERVICAL SPINE WITHOUT CONTRAST TECHNIQUE: Multidetector CT imaging of the head  and cervical spine was performed following the standard protocol without intravenous contrast. Multiplanar CT image reconstructions of the cervical spine were also generated. COMPARISON:  Prior head CT examinations 08/31/2018 and earlier FINDINGS: CT HEAD FINDINGS Brain:  There is edema within the left temporal stem and within portions of the left temporal, parietal and occipital lobes. This edema is predominantly located within the white matter and likely reflects vasogenic edema. Redemonstrated chronic subcentimeter focus of calcification within the left temporal occipital lobes, as well as a small adjacent left tentorial calcification. There is associated regional mass effect. There is asymmetric prominence of the left temporal horn, likely reflecting some degree of ventricular entrapment. There is no acute intracranial hemorrhage. No extra-axial fluid collection. No midline shift. Vascular: No hyperdense vessel. Skull: Normal. Negative for fracture or focal lesion. Sinuses/Orbits: Visualized orbits show no acute finding. No significant paranasal sinus disease or mastoid effusion at the imaged levels. CT CERVICAL SPINE FINDINGS Alignment: Straightening of the expected cervical lordosis. No significant spondylolisthesis Skull base and vertebrae: The basion-dental and atlanto-dental intervals are maintained.No evidence of acute fracture to the cervical spine. Soft tissues and spinal canal: No prevertebral fluid or swelling. No visible canal hematoma. Disc levels: No significant bony spinal canal or neural foraminal narrowing at any level. Upper chest: No visible airspace consolidation or pneumothorax within the imaged lung apices. These results were called by telephone at the time of interpretation on 05/21/2020 at 6:34 pm to provider [facility] , who verbally acknowledged these results. IMPRESSION: CT head: 1. There is edema which appears predominant ly vasogenic within portions of the left temporal, parietal  and occipital lobes. Findings are most suspicious for an underlying mass. Contrast-enhanced brain MRI is recommended for further characterization. Redemonstrated small chronic parenchymal and tentorial calcifications in this region. 2. Regional mass effect with partial effacement of the left lateral ventricle. Associated prominence of the left temporal horn likely reflecting ventricular entrapment. CT cervical spine: No evidence of acute fracture to the cervical spine. Electronically Signed   By: Kellie Simmering DO   On: 05/21/2020 18:35   CT Cervical Spine Wo Contrast  Result Date: 05/21/2020 CLINICAL DATA:  Altered mental status (AMS), unclear cause. Neck trauma, uncomplicated. Additional history provided: Altered mental status, neck trauma, witnessed seizure while walking, reported to have fallen foward onto face. EXAM: CT HEAD WITHOUT CONTRAST CT CERVICAL SPINE WITHOUT CONTRAST TECHNIQUE: Multidetector CT imaging of the head and cervical spine was performed following the standard protocol without intravenous contrast. Multiplanar CT image reconstructions of the cervical spine were also generated. COMPARISON:  Prior head CT examinations 08/31/2018 and earlier FINDINGS: CT HEAD FINDINGS Brain: There is edema within the left temporal stem and within portions of the left temporal, parietal and occipital lobes. This edema is predominantly located within the white matter and likely reflects vasogenic edema. Redemonstrated chronic subcentimeter focus of calcification within the left temporal occipital lobes, as well as a small adjacent left tentorial calcification. There is associated regional mass effect. There is asymmetric prominence of the left temporal horn, likely reflecting some degree of ventricular entrapment. There is no acute intracranial hemorrhage. No extra-axial fluid collection. No midline shift. Vascular: No hyperdense vessel. Skull: Normal. Negative for fracture or focal lesion. Sinuses/Orbits:  Visualized orbits show no acute finding. No significant paranasal sinus disease or mastoid effusion at the imaged levels. CT CERVICAL SPINE FINDINGS Alignment: Straightening of the expected cervical lordosis. No significant spondylolisthesis Skull base and vertebrae: The basion-dental and atlanto-dental intervals are maintained.No evidence of acute fracture to the cervical spine. Soft tissues and spinal canal: No prevertebral fluid or swelling. No visible canal hematoma. Disc levels: No significant bony spinal canal or neural foraminal narrowing at any level. Upper chest: No visible  airspace consolidation or pneumothorax within the imaged lung apices. These results were called by telephone at the time of interpretation on 05/21/2020 at 6:34 pm to provider [facility] , who verbally acknowledged these results. IMPRESSION: CT head: 1. There is edema which appears predominant ly vasogenic within portions of the left temporal, parietal and occipital lobes. Findings are most suspicious for an underlying mass. Contrast-enhanced brain MRI is recommended for further characterization. Redemonstrated small chronic parenchymal and tentorial calcifications in this region. 2. Regional mass effect with partial effacement of the left lateral ventricle. Associated prominence of the left temporal horn likely reflecting ventricular entrapment. CT cervical spine: No evidence of acute fracture to the cervical spine. Electronically Signed   By: Kellie Simmering DO   On: 05/21/2020 18:35   DG Chest Portable 1 View  Result Date: 05/21/2020 CLINICAL DATA:  Seizure, altered mental status. EXAM: PORTABLE CHEST 1 VIEW COMPARISON:  None. FINDINGS: The lungs are hyperinflated. There is no evidence of acute infiltrate, pleural effusion or pneumothorax. The heart size and mediastinal contours are within normal limits. The visualized skeletal structures are unremarkable. IMPRESSION: No active disease. Electronically Signed   By: Virgina Norfolk M.D.   On: 05/21/2020 16:26    ROS: As above Blood pressure 103/65, pulse 87, temperature 98.3 F (36.8 C), temperature source Oral, resp. rate 19, SpO2 100 %. Estimated body mass index is 19.76 kg/m as calculated from the following:   Height as of 12/19/19: 5' 2.5" (1.588 m).   Weight as of 12/19/19: 49.8 kg.  Physical Exam  General: A mildly somnolent somewhat cooperative 29 year old white female in no apparent distress  HEENT: Normocephalic, pupils equal round reactive light, extraocular muscles are intact  Neck: Unremarkable  Thorax: Symmetric  Abdomen: Unremarkable  Extremities: The patient has some abrasions on her bilateral knuckles  Neurologic exam: The patient is alert and oriented x14+, person, a hospital, [facility], and the year.  Cranial nerve exam is grossly normal bilaterally with grossly normal vision and normal hearing.  Her motor strength is 5/5 in her bilateral bicep, handgrip and lower extremities.  I have reviewed the patient's head CT performed yesterday at [facility].  There is left parietal calcifications with some mild edema without significant mass-effect.  I reviewed her head CT performed on 08/31/2018 which demonstrates left parietal calcifications and a subtle abnormality.  By the radiology report this was unchanged from her prior scan of 11/17.  I cannot see the 11/17 images.    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  This will need to be better characterized on an MRI scan when she could be more cooperative.  She will likely need surgery but her psychiatric disorder, substance abuse, etc. makes things more difficult.  There is no need for urgent surgery.  I would recommend seizure control and we can plan surgery in future if she will cooperate with an MRI scan,  is agreeable and we can obtain proper consent.  Ophelia Charter 05/22/2020, 7:50 AM

## 2020-05-22 NOTE — Discharge Summary (Signed)
Physician Discharge Summary  Sonya Prince KGM:[id number] DOB: 28-Sep-1991 DOA: 05/21/2020  PCP: Patient, No Pcp Per  Admit date: 05/21/2020 Discharge date: 05/22/2020  Admitted From: Home Discharge disposition: Home   Code Status: Full Code  Diet Recommendation: Regular diet   Recommendations for Outpatient Follow-Up:   1. Follow-up with PCP as an outpatient 2. Follow-up with neurosurgeon Dr. Mickeal Skinner  Discharge Diagnosis:   Principal Problem:   Grand mal seizure [facility]) Active Problems:   Bipolar affective disorder, currently manic, mild (HCC)   Vasogenic brain edema (HCC)   Nicotine dependence, cigarettes, uncomplicated   Toxic metabolic encephalopathy    History of Present Illness / Brief narrative:  Sonya Prince a 29 y.o.femalewith a history of meningitis as a child, PTSD, ADHD, bipolar disorder, seizure disorder. Patient was brought to the ED by EMS after having several episodes of witnessed grand mal seizures in public.  EMS gave her a total of 7.5 of Versed and brought her to ED  On arrival, she was confused and combative, but following commands.   She required additional Ativan IV in the ED.  She was also started on Keppra. CT scan of head showed slightly hyperdense lesion with surrounding edema in the left parietal region along with previously demonstrated calcification.  According the patient's father, the patient is currently homeless, suffers from bipolar disorder and does not maintain a relationship with him. Of note, patient was hospitalized at [facility] in [place] in August 2020 with a similar presentation of multiple episodes of seizure-like activity. During the hospitalization CT head and EEG were both unremarkable.   MRI was not obtained at that time.  Patient was discharged on Zonisamideand advised to follow-up with neurology in the outpatient setting at that time.  However patient was noncompliant to medication as well as  follow-up.  Patient was admitted under hospitalist service. Consult obtained from neurology as well as neurosurgery.  Hospital Course:  Grand mal seizure History of seizure disorder Noncompliance to antiepileptics -Seizure possibly related to brain tumor. -Neurology consult appreciated.  Earlier, patient was started on Depakote.  -Considering her risk of pregnancy and significant side effect with Depakote, she has been switched to Trileptal 150 mg twice daily.   Left parietal brain tumor -CT scan of head showed slightly hyperdense lesion with surrounding edema in the left parietal region along with previously demonstrated calcification. 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 Patient underwent MRI brain this afternoon.  It reported  -Case was discussed with neurologist Dr. Lorraine Lax and neurosurgeon Dr. Mickeal Skinner.  Per Dr. Barbette Or stabilizing on AEDs and I can repeat an MRI brain in 4-6 weeks and see her in the clinic. The pattern of calcification on prior CT suggests this is not new/aggressive. If this was GBM she would have language deficits'' -As per neurology accommodation, will discharge the patient on Trileptal 150 mg twice daily.  I will give her prescription for 6 weeks supply.  She will see Dr. Mickeal Skinner as an outpatient in 4 weeks. I explained the plan to patient and her dad.  Dr. Rhea Pink to discharge home today.  Subjective:  Seen and examined this morning.  She tried to leave AMA earlier.  Her dad convinced her to stay.  At the time of my evaluation, patient was calm and waiting for MRI.  Discharge Exam:   Vitals:   05/22/20 0830 05/22/20 0935 05/22/20 1311 05/22/20 1528  BP: 112/77 112/70 117/80 118/80  Pulse: 97 98 (!) 103 93  Resp:  17  16  Temp:      TempSrc:      SpO2: 99% 98% 100% 97%    There is no height or weight on file to calculate BMI.  General exam: Appears calm and comfortable.  Skin: No rashes, lesions or ulcers. HEENT:  Atraumatic, normocephalic, supple neck, no obvious bleeding Lungs: Clear to auscultate bilaterally CVS: Regular rate and rhythm, no murmur GI/Abd soft, nontender, nondistended, bowel sound present CNS: Alert, awake, oriented x3 Psychiatry: Mood appropriate Extremities: No pedal edema, no calf tenderness  Discharge Instructions:  Wound care: None     Discharge Instructions    Diet general   Complete by: As directed    Increase activity slowly   Complete by: As directed       Follow-up Information    Vaslow, Acey Lav, MD Follow up.   Specialties: Psychiatry, Neurology, Oncology Contact information: [place] [address] [phone]              Allergies as of 05/22/2020      Reactions   Doxycycline Swelling      Medication List    STOP taking these medications   acetaminophen 325 MG tablet Commonly known as: Tylenol   ibuprofen 200 MG tablet Commonly known as: Advil   oxyCODONE 5 MG immediate release tablet Commonly known as: Roxicodone     TAKE these medications   amphetamine-dextroamphetamine 20 MG tablet Commonly known as: ADDERALL Take 20 mg by mouth daily.   clonazePAM 0.5 MG tablet Commonly known as: KLONOPIN Take 0.5 mg by mouth as needed for anxiety.   FLUoxetine 20 MG capsule Commonly known as: PROZAC Take 20 mg by mouth daily.   OXcarbazepine 150 MG tablet Commonly known as: TRILEPTAL Take 1 tablet (150 mg total) by mouth 2 (two) times daily.       Time coordinating discharge: 35 minutes  The results of significant diagnostics from this hospitalization (including imaging, microbiology, ancillary and laboratory) are listed below for reference.    Procedures and Diagnostic Studies:   CT Head Wo Contrast  Result Date: 05/21/2020 CLINICAL DATA:  Altered mental status (AMS), unclear cause. Neck trauma, uncomplicated. Additional history provided: Altered mental status, neck trauma, witnessed seizure while walking,  reported to have fallen foward onto face. EXAM: CT HEAD WITHOUT CONTRAST CT CERVICAL SPINE WITHOUT CONTRAST TECHNIQUE: Multidetector CT imaging of the head and cervical spine was performed following the standard protocol without intravenous contrast. Multiplanar CT image reconstructions of the cervical spine were also generated. COMPARISON:  Prior head CT examinations 08/31/2018 and earlier FINDINGS: CT HEAD FINDINGS Brain: There is edema within the left temporal stem and within portions of the left temporal, parietal and occipital lobes. This edema is predominantly located within the white matter and likely reflects vasogenic edema. Redemonstrated chronic subcentimeter focus of calcification within the left temporal occipital lobes, as well as a small adjacent left tentorial calcification. There is associated regional mass effect. There is asymmetric prominence of the left temporal horn, likely reflecting some degree of ventricular entrapment. There is no acute intracranial hemorrhage. No extra-axial fluid collection. No midline shift. Vascular: No hyperdense vessel. Skull: Normal. Negative for fracture or focal lesion. Sinuses/Orbits: Visualized orbits show no acute finding. No significant paranasal sinus disease or mastoid effusion at the imaged levels. CT CERVICAL SPINE FINDINGS Alignment: Straightening of the expected cervical lordosis. No significant spondylolisthesis Skull base and vertebrae: The basion-dental and atlanto-dental intervals are maintained.No evidence of acute fracture to the cervical  spine. Soft tissues and spinal canal: No prevertebral fluid or swelling. No visible canal hematoma. Disc levels: No significant bony spinal canal or neural foraminal narrowing at any level. Upper chest: No visible airspace consolidation or pneumothorax within the imaged lung apices. These results were called by telephone at the time of interpretation on 05/21/2020 at 6:34 pm to provider [facility] , who  verbally acknowledged these results. IMPRESSION: CT head: 1. There is edema which appears predominant ly vasogenic within portions of the left temporal, parietal and occipital lobes. Findings are most suspicious for an underlying mass. Contrast-enhanced brain MRI is recommended for further characterization. Redemonstrated small chronic parenchymal and tentorial calcifications in this region. 2. Regional mass effect with partial effacement of the left lateral ventricle. Associated prominence of the left temporal horn likely reflecting ventricular entrapment. CT cervical spine: No evidence of acute fracture to the cervical spine. Electronically Signed   By: Kellie Simmering DO   On: 05/21/2020 18:35   CT Cervical Spine Wo Contrast  Result Date: 05/21/2020 CLINICAL DATA:  Altered mental status (AMS), unclear cause. Neck trauma, uncomplicated. Additional history provided: Altered mental status, neck trauma, witnessed seizure while walking, reported to have fallen foward onto face. EXAM: CT HEAD WITHOUT CONTRAST CT CERVICAL SPINE WITHOUT CONTRAST TECHNIQUE: Multidetector CT imaging of the head and cervical spine was performed following the standard protocol without intravenous contrast. Multiplanar CT image reconstructions of the cervical spine were also generated. COMPARISON:  Prior head CT examinations 08/31/2018 and earlier FINDINGS: CT HEAD FINDINGS Brain: There is edema within the left temporal stem and within portions of the left temporal, parietal and occipital lobes. This edema is predominantly located within the white matter and likely reflects vasogenic edema. Redemonstrated chronic subcentimeter focus of calcification within the left temporal occipital lobes, as well as a small adjacent left tentorial calcification. There is associated regional mass effect. There is asymmetric prominence of the left temporal horn, likely reflecting some degree of ventricular entrapment. There is no acute intracranial  hemorrhage. No extra-axial fluid collection. No midline shift. Vascular: No hyperdense vessel. Skull: Normal. Negative for fracture or focal lesion. Sinuses/Orbits: Visualized orbits show no acute finding. No significant paranasal sinus disease or mastoid effusion at the imaged levels. CT CERVICAL SPINE FINDINGS Alignment: Straightening of the expected cervical lordosis. No significant spondylolisthesis Skull base and vertebrae: The basion-dental and atlanto-dental intervals are maintained.No evidence of acute fracture to the cervical spine. Soft tissues and spinal canal: No prevertebral fluid or swelling. No visible canal hematoma. Disc levels: No significant bony spinal canal or neural foraminal narrowing at any level. Upper chest: No visible airspace consolidation or pneumothorax within the imaged lung apices. These results were called by telephone at the time of interpretation on 05/21/2020 at 6:34 pm to provider [facility] , who verbally acknowledged these results. IMPRESSION: CT head: 1. There is edema which appears predominant ly vasogenic within portions of the left temporal, parietal and occipital lobes. Findings are most suspicious for an underlying mass. Contrast-enhanced brain MRI is recommended for further characterization. Redemonstrated small chronic parenchymal and tentorial calcifications in this region. 2. Regional mass effect with partial effacement of the left lateral ventricle. Associated prominence of the left temporal horn likely reflecting ventricular entrapment. CT cervical spine: No evidence of acute fracture to the cervical spine. Electronically Signed   By: Kellie Simmering DO   On: 05/21/2020 18:35   MR Brain W and Wo Contrast  Result Date: 05/22/2020 CLINICAL DATA:  Abnormal CT head EXAM: MRI  HEAD WITHOUT AND WITH CONTRAST TECHNIQUE: Multiplanar, multiecho pulse sequences of the brain and surrounding structures were obtained without and with intravenous contrast. CONTRAST:  46mL  GADAVIST GADOBUTROL 1 MMOL/ML IV SOLN COMPARISON:  Correlation made with prior head CT FINDINGS: Brain: Heterogeneous, enhancing mass centered within the posterior left temporal lobe measuring approximately 4.2 x 3.4 x 2.9 cm. Foci of susceptibility are present corresponding to mineralization on CT. There is minor surrounding edema. Regional mass effect is unchanged with trapping of the left temporal horn. No additional mass or abnormal enhancement. No acute infarction or hemorrhage. Vascular: Major vessel flow voids at the skull base are preserved. Skull and upper cervical spine: Normal marrow signal. Sinuses/Orbits: Minor mucosal thickening.  Orbits are unremarkable. Other: Mastoid air cells clear. IMPRESSION: Posterior left temporal lobe mass as described with mild trapping of the left temporal horn. This likely reflects a slow growing primary neoplasm, noting calcification was present on prior studies. Primary differential considerations include ganglioglioma and oligodendroglioma or uncommon location of pilocytic astrocytoma. Electronically Signed   By: Macy Mis M.D.   On: 05/22/2020 15:20   DG Chest Portable 1 View  Result Date: 05/21/2020 CLINICAL DATA:  Seizure, altered mental status. EXAM: PORTABLE CHEST 1 VIEW COMPARISON:  None. FINDINGS: The lungs are hyperinflated. There is no evidence of acute infiltrate, pleural effusion or pneumothorax. The heart size and mediastinal contours are within normal limits. The visualized skeletal structures are unremarkable. IMPRESSION: No active disease. Electronically Signed   By: Virgina Norfolk M.D.   On: 05/21/2020 16:26     Labs:   Basic Metabolic Panel: Recent Labs  Lab 05/21/20 1722 05/22/20 0532  NA 140 141  K 3.6 4.1  CL 106 113*  CO2 25 21*  GLUCOSE 110* 92  BUN 8 8  CREATININE 0.89 0.81  CALCIUM 9.4 8.6*  MG 2.2 2.1   GFR CrCl cannot be calculated (Unknown ideal weight.). Liver Function Tests: Recent Labs  Lab 05/21/20 1722  05/22/20 0532  AST 28 28  ALT 19 15  ALKPHOS 53 42  BILITOT 0.7 0.7  PROT 7.4 6.0*  ALBUMIN 4.2 3.4*   Recent Labs  Lab 05/21/20 1722  LIPASE 19   No results for input(s): AMMONIA in the last 168 hours. Coagulation profile No results for input(s): INR, PROTIME in the last 168 hours.  CBC: Recent Labs  Lab 05/21/20 1722 05/22/20 0532  WBC 13.8* 9.8  NEUTROABS 11.8* 5.7  HGB 14.2 13.0  HCT 42.8 39.5  MCV 94.9 97.1  PLT 203 176   Cardiac Enzymes: No results for input(s): CKTOTAL, CKMB, CKMBINDEX, TROPONINI in the last 168 hours. BNP: Invalid input(s): POCBNP CBG: Recent Labs  Lab 05/21/20 2110  GLUCAP 88   D-Dimer No results for input(s): DDIMER in the last 72 hours. Hgb A1c No results for input(s): HGBA1C in the last 72 hours. Lipid Profile No results for input(s): CHOL, HDL, LDLCALC, TRIG, CHOLHDL, LDLDIRECT in the last 72 hours. Thyroid function studies Recent Labs    05/22/20 0532  TSH 0.126*   Anemia work up Recent Labs    05/22/20 0532  VITAMINB12 428  FOLATE 20.0   Microbiology Recent Results (from the past 240 hour(s))  Blood culture (routine x 2)     Status: None (Preliminary result)   Collection Time: 05/21/20  3:40 PM   Specimen: BLOOD  Result Value Ref Range Status   Specimen Description BLOOD LEFT ANTECUBITAL  Final   Special Requests   Final  BOTTLES DRAWN AEROBIC AND ANAEROBIC Blood Culture adequate volume   Culture   Final    NO GROWTH < 24 HOURS Performed at [facility], [place] [address] [place], [place] [address]    Report Status PENDING  Incomplete  SARS Coronavirus 2 by RT PCR (hospital order, performed in [facility] hospital lab) Nasopharyngeal Nasopharyngeal Swab     Status: None   Collection Time: 05/21/20  5:24 PM   Specimen: Nasopharyngeal Swab  Result Value Ref Range Status   SARS Coronavirus 2 NEGATIVE NEGATIVE Final    Comment: (NOTE) SARS-CoV-2 target nucleic acids are NOT DETECTED.  The SARS-CoV-2 RNA  is generally detectable in upper and lower respiratory specimens during the acute phase of infection. The lowest concentration of SARS-CoV-2 viral copies this assay can detect is 250 copies / mL. A negative result does not preclude SARS-CoV-2 infection and should not be used as the sole basis for treatment or other patient management decisions.  A negative result may occur with improper specimen collection / handling, submission of specimen other than nasopharyngeal swab, presence of viral mutation(s) within the areas targeted by this assay, and inadequate number of viral copies (<250 copies / mL). A negative result must be combined with clinical observations, patient history, and epidemiological information.  Fact Sheet for Patients:   StrictlyIdeas.no  Fact Sheet for Healthcare Providers: BankingDealers.co.za  This test is not yet approved or  cleared by the [place] FDA and has been authorized for detection and/or diagnosis of SARS-CoV-2 by FDA under an Emergency Use Authorization (EUA).  This EUA will remain in effect (meaning this test can be used) for the duration of the COVID-19 declaration under Section 564(b)(1) of the Act, 21 U.S.C. section 360bbb-3(b)(1), unless the authorization is terminated or revoked sooner.  Performed at [facility], [place] [address] [place], [place] [address]   Blood culture (routine x 2)     Status: None (Preliminary result)   Collection Time: 05/22/20  5:33 AM   Specimen: BLOOD RIGHT HAND  Result Value Ref Range Status   Specimen Description BLOOD RIGHT HAND  Final   Special Requests   Final    BOTTLES DRAWN AEROBIC AND ANAEROBIC Blood Culture adequate volume   Culture   Final    NO GROWTH < 12 HOURS Performed at [facility], [place] [address] [place], [place] [address]    Report Status PENDING  Incomplete    Please note: You were cared for by a hospitalist during your  hospital stay. Once you are discharged, your primary care physician will handle any further medical issues. Please note that NO REFILLS for any discharge medications will be authorized once you are discharged, as it is imperative that you return to your primary care physician (or establish a relationship with a primary care physician if you do not have one) for your post hospital discharge needs so that they can reassess your need for medications and monitor your lab values.  Signed: Terrilee Croak  [facility] 05/22/2020, 4:23 PM

## 2020-05-22 NOTE — ED Notes (Signed)
Patient continued to curse and use foul language towards staff. Unable to redirect.

## 2020-05-22 NOTE — Progress Notes (Addendum)
Reason for consult: Seizure   Subjective: Patient is awake alert and oriented.   ROS: negative except above  Examination  Vital signs in last 24 hours: Temp:  [98.3 F (36.8 C)-98.6 F (37 C)] 98.4 F (36.9 C) (06/22 0752) Pulse Rate:  [79-126] 98 (06/22 0935) Resp:  [12-25] 17 (06/22 0935) BP: (87-117)/(51-99) 112/70 (06/22 0935) SpO2:  [93 %-100 %] 98 % (06/22 0935)  General: lying in bed CVS: pulse-normal rate and rhythm RS: breathing comfortably Extremities: normal   Neuro: MS: Alert, oriented, follows commands CN: pupils equal and reactive,  EOMI, face symmetric, tongue midline, normal sensation over face, Motor: 5/5 strength in all 4 extremities Coordination: normal Gait: not tested  Basic Metabolic Panel: Recent Labs  Lab 05/21/20 1722 05/22/20 0532  NA 140 141  K 3.6 4.1  CL 106 113*  CO2 25 21*  GLUCOSE 110* 92  BUN 8 8  CREATININE 0.89 0.81  CALCIUM 9.4 8.6*  MG 2.2 2.1    CBC: Recent Labs  Lab 05/21/20 1722 05/22/20 0532  WBC 13.8* 9.8  NEUTROABS 11.8* 5.7  HGB 14.2 13.0  HCT 42.8 39.5  MCV 94.9 97.1  PLT 203 176     Coagulation Studies: No results for input(s): LABPROT, INR in the last 72 hours.  Imaging Reviewed:     ASSESSMENT AND PLAN  29 year old female with a history of meningitis, PTSD, previous seizures with some question of drug abuse presents to the emergency department with seizure activity with CT head showing a left parietal lobe mass with edema and calcifications. Presented in August 2020, was started on Zonegran but has not been compliant.  Patient was loaded with IV valproic acid and neurosurgery has been consulted.  MRI brain has been attempted but patient did not tolerate-we will repeat MRI brain with Ativan and stressed importance of getting the scan.  Seizure with postictal state Left parietal lobe mass lesion  Recommendations -MRI brain with and without contrast:  -Routine EEG still pending -Patient  started on Depakote, however this is not a good long-term AED in a woman of childbearing age and therefore we will switch to oxcarbamazepine ( Trileptal) 150mg  twice daily  -Continue clonazepam 0.5mg  PRN she takes for anxiety to avoid withdrawal (not a good long-term medication) -Neurosurgical recommendations regarding left parietal lobe mass, consider neuro oncology consult with Dr. Mickeal Skinner -Urine drug screen still pending -No driving for 6 months   Per [facility] statutes, patients with seizures are not allowed to drive until they have been seizure-free for six months. Use caution when using heavy equipment or power tools. Avoid working on ladders or at heights. Take showers instead of baths. Ensure the water temperature is not too high on the home water heater. Do not go swimming alone. Do not lock yourself in a room alone (i.e. bathroom). When caring for infants or small children, sit down when holding, feeding, or changing them to minimize risk of injury to the child in the event you have a seizure. Maintain good sleep hygiene. Avoid alcohol.    If TAMAKIA PORTO has another seizure, call 911 and bring them back to the ED if:       A.  The seizure lasts longer than 5 minutes.            B.  The patient doesn't wake shortly after the seizure or has new problems such as difficulty seeing, speaking or moving following the seizure       C.  The patient was injured during the seizure       D.  The patient has a temperature over 102 F (39C)       E.  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  Further management per neurosurgery.  Also, recommend consulting Dr. Mickeal Skinner, neuro oncologist who can also assist in out patient management of seizures    Zniya Cottone [facility] Pager Number [phone] For questions after 7pm  please refer to AMION to reach the Neurologist on call

## 2020-05-22 NOTE — ED Notes (Signed)
Off floor to MRI

## 2020-05-23 NOTE — Procedures (Addendum)
Patient Name: BABY GIEGER  MRN: [id number]  Epilepsy Attending: Lora Havens  Referring Physician/Provider: Dr Karena Addison Aroor Date: 05/22/2020 Duration: 21.58 mins  Patient history: 29 year old female with a history of meningitis, PTSD, previous seizures with some question of drug abuse presents to the emergency department with seizure activity with CT head showing a left parietal lobe mass with edema and calcifications. EEG to evaluate for seizure  Level of alertness: Awake  AEDs during EEG study: Oxcarb  Technical aspects: This EEG study was done with scalp electrodes positioned according to the 10-20 International system of electrode placement. Electrical activity was acquired at a sampling rate of 500Hz  and reviewed with a high frequency filter of 70Hz  and a low frequency filter of 1Hz . EEG data were recorded continuously and digitally stored.   Description: The posterior dominant rhythm consists of 9-10 Hz activity of moderate voltage (25-35 uV) seen predominantly in posterior head regions, symmetric and reactive to eye opening and eye closing. EEG showed intermittent left temporal 3 to 6 Hz theta-delta slowing. Hyperventilation and photic stimulation were not performed.     ABNORMALITY -Intermittent slow, left temporal region  IMPRESSION: This study is suggestive of cortical dysfunction in left temporal region, non specific to etiology but likely secondary to underlying mass.  No seizures or epileptiform discharges were seen throughout the recording.  [place] Barbra Sarks

## 2020-05-26 LAB — CULTURE, BLOOD (ROUTINE X 2)
Culture: NO GROWTH
Special Requests: ADEQUATE

## 2020-05-27 LAB — CULTURE, BLOOD (ROUTINE X 2)
Culture: NO GROWTH
Special Requests: ADEQUATE

## 2020-05-28 ENCOUNTER — Inpatient Hospital Stay: Payer: Self-pay | Attending: Internal Medicine

## 2020-05-28 ENCOUNTER — Telehealth: Payer: Self-pay | Admitting: *Deleted

## 2020-05-28 NOTE — Telephone Encounter (Signed)
Patients father called and left message to schedule follow up.  Attempted to reach father back.  Scheduled and will confirm with father upon return of call.

## 2020-05-28 NOTE — Telephone Encounter (Signed)
Spoke with dad and confirmed appointment.

## 2020-05-31 ENCOUNTER — Inpatient Hospital Stay: Payer: Self-pay | Attending: Internal Medicine | Admitting: Internal Medicine

## 2020-05-31 ENCOUNTER — Other Ambulatory Visit: Payer: Self-pay

## 2020-05-31 VITALS — BP 115/86 | HR 88 | Temp 97.7°F | Resp 16 | Ht 62.5 in | Wt 113.4 lb

## 2020-05-31 DIAGNOSIS — Z79899 Other long term (current) drug therapy: Secondary | ICD-10-CM | POA: Insufficient documentation

## 2020-05-31 DIAGNOSIS — F17200 Nicotine dependence, unspecified, uncomplicated: Secondary | ICD-10-CM | POA: Insufficient documentation

## 2020-05-31 DIAGNOSIS — R4182 Altered mental status, unspecified: Secondary | ICD-10-CM | POA: Insufficient documentation

## 2020-05-31 DIAGNOSIS — R220 Localized swelling, mass and lump, head: Secondary | ICD-10-CM | POA: Insufficient documentation

## 2020-05-31 DIAGNOSIS — G9389 Other specified disorders of brain: Secondary | ICD-10-CM

## 2020-05-31 DIAGNOSIS — R569 Unspecified convulsions: Secondary | ICD-10-CM | POA: Insufficient documentation

## 2020-05-31 DIAGNOSIS — F4310 Post-traumatic stress disorder, unspecified: Secondary | ICD-10-CM | POA: Insufficient documentation

## 2020-05-31 NOTE — Progress Notes (Signed)
[facility] [place], [place] [address] [phone]   New Patient Evaluation  Date of Service: 05/31/20 Patient Name: Sonya Prince Patient MRN: [id number] Patient DOB: [birth year] Provider: Ventura Sellers, MD  Identifying Statement:  Sonya Prince is a 29 y.o. female with left temporal mass who presents for initial consultation and evaluation.    Oncologic History:  Biomarkers:  MGMT Unknown .  IDH 1/2 Unknown.  EGFR Unknown  TERT Unknown   History of Present Illness: The patient's records from the referring physician were obtained and reviewed and the patient interviewed to confirm this HPI.  Sonya Prince presented to medical attention with a seizure or series of seizures associated with loss of consciousness.  Further details of the seizure events are not well described or recalled.  There was also a seizure event in 2020 and another going back to 2019, per patient and records.  CNS imaging demonstrated a large, enhancing left temporal mass.  Associated calcifications had been noted going back to trauma CT head eval in 2017.  At this time she is back at baseline, no recurrent seizure events.  She denies any other focal or progressive neurologic symptoms.  Currently living at home with her father.    Medications: Current Outpatient Medications on File Prior to Visit  Medication Sig Dispense Refill  . amphetamine-dextroamphetamine (ADDERALL) 20 MG tablet Take 20 mg by mouth daily.     . clonazePAM (KLONOPIN) 0.5 MG tablet Take 0.5 mg by mouth as needed for anxiety.     Marland Kitchen FLUoxetine (PROZAC) 20 MG capsule Take 20 mg by mouth daily.    . OXcarbazepine (TRILEPTAL) 150 MG tablet Take 1 tablet (150 mg total) by mouth 2 (two) times daily. 90 tablet 0   No current facility-administered medications on file prior to visit.    Allergies:  Allergies  Allergen Reactions  . Doxycycline Swelling   Past Medical History:  Past  Medical History:  Diagnosis Date  . Anxiety   . Meningitis   . PTSD (post-traumatic stress disorder)    Past Surgical History:  Past Surgical History:  Procedure Laterality Date  . OPEN REDUCTION INTERNAL FIXATION (ORIF) METACARPAL Right 12/19/2019   Procedure: OPEN TREATMENT OF RIGHT 5TH METACARPAL FRACTURE;  Surgeon: Milly Jakob, MD;  Location: [facility];  Service: Orthopedics;  Laterality: Right;  WITH PREOP REGIONAL BLOCK  . WISDOM TOOTH EXTRACTION     Social History:  Social History   Socioeconomic History  . Marital status: Single    Spouse name: Not on file  . Number of children: Not on file  . Years of education: Not on file  . Highest education level: Not on file  Occupational History  . Not on file  Tobacco Use  . Smoking status: Current Every Day Smoker  . Smokeless tobacco: Never Used  Vaping Use  . Vaping Use: Every day  Substance and Sexual Activity  . Alcohol use: Yes    Comment: 3 times a month   . Drug use: Yes    Types: Marijuana  . Sexual activity: Yes  Other Topics Concern  . Not on file  Social History Narrative  . Not on file   Social Determinants of Health   Financial Resource Strain:   . Difficulty of Paying Living Expenses:   Food Insecurity:   . Worried About Charity fundraiser in the Last Year:   . [employer] of  Food in the Last Year:   Transportation Needs:   . Film/video editor (Medical):   Marland Kitchen Lack of Transportation (Non-Medical):   Physical Activity:   . Days of Exercise per Week:   . Minutes of Exercise per Session:   Stress:   . Feeling of Stress :   Social Connections:   . Frequency of Communication with Friends and Family:   . Frequency of Social Gatherings with Friends and Family:   . Attends Religious Services:   . Active Member of Clubs or Organizations:   . Attends Archivist Meetings:   Marland Kitchen Marital Status:   Intimate Partner Violence:   . Fear of Current or Ex-Partner:   . Emotionally  Abused:   Marland Kitchen Physically Abused:   . Sexually Abused:    Family History:  Family History  Family history unknown: Yes    Review of Systems: Constitutional: Doesn't report fevers, chills or abnormal weight loss Eyes: Doesn't report blurriness of vision Ears, nose, mouth, throat, and face: Doesn't report sore throat Respiratory: Doesn't report cough, dyspnea or wheezes Cardiovascular: Doesn't report palpitation, chest discomfort  Gastrointestinal:  Doesn't report nausea, constipation, diarrhea GU: Doesn't report incontinence Skin: Doesn't report skin rashes Neurological: Per HPI Musculoskeletal: Doesn't report joint pain Behavioral/Psych: Doesn't report anxiety  Physical Exam: Vitals:   05/31/20 0941  BP: 115/86  Pulse: 88  Resp: 16  Temp: 97.7 F (36.5 C)  SpO2: 100%   KPS: 90. General: Alert, cooperative, pleasant, in no acute distress Head: Normal EENT: No conjunctival injection or scleral icterus.  Lungs: Resp effort normal Cardiac: Regular rate Abdomen: Non-distended abdomen Skin: No rashes cyanosis or petechiae. Extremities: No clubbing or edema  Neurologic Exam: Mental Status: Awake, alert, attentive to examiner. Oriented to self and environment. Language is fluent with intact comprehension.  Cranial Nerves: Visual acuity is grossly normal. Visual fields are full. Extra-ocular movements intact. No ptosis. Face is symmetric Motor: Tone and bulk are normal. Power is full in both arms and legs. Reflexes are symmetric, no pathologic reflexes present.  Sensory: Intact to light touch Gait: Normal.   Labs: I have reviewed the data as listed    Component Value Date/Time   NA 141 05/22/2020 0532   K 4.1 05/22/2020 0532   CL 113 (H) 05/22/2020 0532   CO2 21 (L) 05/22/2020 0532   GLUCOSE 92 05/22/2020 0532   BUN 8 05/22/2020 0532   CREATININE 0.81 05/22/2020 0532   CALCIUM 8.6 (L) 05/22/2020 0532   PROT 6.0 (L) 05/22/2020 0532   ALBUMIN 3.4 (L) 05/22/2020 0532    AST 28 05/22/2020 0532   ALT 15 05/22/2020 0532   ALKPHOS 42 05/22/2020 0532   BILITOT 0.7 05/22/2020 0532   GFRNONAA >60 05/22/2020 0532   GFRAA >60 05/22/2020 0532   Lab Results  Component Value Date   WBC 9.8 05/22/2020   NEUTROABS 5.7 05/22/2020   HGB 13.0 05/22/2020   HCT 39.5 05/22/2020   MCV 97.1 05/22/2020   PLT 176 05/22/2020    Imaging:  EEG  Result Date: 05/22/2020 Lora Havens, MD     05/23/2020  8:22 AM Patient Name: CHIZUKO TRINE MRN: [id number] Epilepsy Attending: Lora Havens Referring Physician/Provider: Dr Karena Addison Aroor Date: 05/22/2020 Duration: 21.58 mins Patient history: 29 year old female with a history of meningitis, PTSD, previous seizures with some question of drug abuse presents to the emergency department with seizure activity with CT head showing a left parietal lobe mass with edema and calcifications. EEG  to evaluate for seizure Level of alertness: Awake AEDs during EEG study: Medical illustrator aspects: This EEG study was done with scalp electrodes positioned according to the 10-20 International system of electrode placement. Electrical activity was acquired at a sampling rate of '500Hz'  and reviewed with a high frequency filter of '70Hz'  and a low frequency filter of '1Hz' . EEG data were recorded continuously and digitally stored. Description: The posterior dominant rhythm consists of 9-10 Hz activity of moderate voltage (25-35 uV) seen predominantly in posterior head regions, symmetric and reactive to eye opening and eye closing. EEG showed intermittent left temporal 3 to 6 Hz theta-delta slowing. Hyperventilation and photic stimulation were not performed.   ABNORMALITY -Intermittent slow, left temporal region IMPRESSION: This study is suggestive of cortical dysfunction in left temporal region, non specific to etiology but likely secondary to underlying mass.  No seizures or epileptiform discharges were seen throughout the recording. Lora Havens   CT  Head Wo Contrast  Result Date: 05/21/2020 CLINICAL DATA:  Altered mental status (AMS), unclear cause. Neck trauma, uncomplicated. Additional history provided: Altered mental status, neck trauma, witnessed seizure while walking, reported to have fallen foward onto face. EXAM: CT HEAD WITHOUT CONTRAST CT CERVICAL SPINE WITHOUT CONTRAST TECHNIQUE: Multidetector CT imaging of the head and cervical spine was performed following the standard protocol without intravenous contrast. Multiplanar CT image reconstructions of the cervical spine were also generated. COMPARISON:  Prior head CT examinations 08/31/2018 and earlier FINDINGS: CT HEAD FINDINGS Brain: There is edema within the left temporal stem and within portions of the left temporal, parietal and occipital lobes. This edema is predominantly located within the white matter and likely reflects vasogenic edema. Redemonstrated chronic subcentimeter focus of calcification within the left temporal occipital lobes, as well as a small adjacent left tentorial calcification. There is associated regional mass effect. There is asymmetric prominence of the left temporal horn, likely reflecting some degree of ventricular entrapment. There is no acute intracranial hemorrhage. No extra-axial fluid collection. No midline shift. Vascular: No hyperdense vessel. Skull: Normal. Negative for fracture or focal lesion. Sinuses/Orbits: Visualized orbits show no acute finding. No significant paranasal sinus disease or mastoid effusion at the imaged levels. CT CERVICAL SPINE FINDINGS Alignment: Straightening of the expected cervical lordosis. No significant spondylolisthesis Skull base and vertebrae: The basion-dental and atlanto-dental intervals are maintained.No evidence of acute fracture to the cervical spine. Soft tissues and spinal canal: No prevertebral fluid or swelling. No visible canal hematoma. Disc levels: No significant bony spinal canal or neural foraminal narrowing at any  level. Upper chest: No visible airspace consolidation or pneumothorax within the imaged lung apices. These results were called by telephone at the time of interpretation on 05/21/2020 at 6:34 pm to provider [facility] , who verbally acknowledged these results. IMPRESSION: CT head: 1. There is edema which appears predominant ly vasogenic within portions of the left temporal, parietal and occipital lobes. Findings are most suspicious for an underlying mass. Contrast-enhanced brain MRI is recommended for further characterization. Redemonstrated small chronic parenchymal and tentorial calcifications in this region. 2. Regional mass effect with partial effacement of the left lateral ventricle. Associated prominence of the left temporal horn likely reflecting ventricular entrapment. CT cervical spine: No evidence of acute fracture to the cervical spine. Electronically Signed   By: Kellie Simmering DO   On: 05/21/2020 18:35   CT Cervical Spine Wo Contrast  Result Date: 05/21/2020 CLINICAL DATA:  Altered mental status (AMS), unclear cause. Neck trauma, uncomplicated. Additional history provided: Altered  mental status, neck trauma, witnessed seizure while walking, reported to have fallen foward onto face. EXAM: CT HEAD WITHOUT CONTRAST CT CERVICAL SPINE WITHOUT CONTRAST TECHNIQUE: Multidetector CT imaging of the head and cervical spine was performed following the standard protocol without intravenous contrast. Multiplanar CT image reconstructions of the cervical spine were also generated. COMPARISON:  Prior head CT examinations 08/31/2018 and earlier FINDINGS: CT HEAD FINDINGS Brain: There is edema within the left temporal stem and within portions of the left temporal, parietal and occipital lobes. This edema is predominantly located within the white matter and likely reflects vasogenic edema. Redemonstrated chronic subcentimeter focus of calcification within the left temporal occipital lobes, as well as a small adjacent  left tentorial calcification. There is associated regional mass effect. There is asymmetric prominence of the left temporal horn, likely reflecting some degree of ventricular entrapment. There is no acute intracranial hemorrhage. No extra-axial fluid collection. No midline shift. Vascular: No hyperdense vessel. Skull: Normal. Negative for fracture or focal lesion. Sinuses/Orbits: Visualized orbits show no acute finding. No significant paranasal sinus disease or mastoid effusion at the imaged levels. CT CERVICAL SPINE FINDINGS Alignment: Straightening of the expected cervical lordosis. No significant spondylolisthesis Skull base and vertebrae: The basion-dental and atlanto-dental intervals are maintained.No evidence of acute fracture to the cervical spine. Soft tissues and spinal canal: No prevertebral fluid or swelling. No visible canal hematoma. Disc levels: No significant bony spinal canal or neural foraminal narrowing at any level. Upper chest: No visible airspace consolidation or pneumothorax within the imaged lung apices. These results were called by telephone at the time of interpretation on 05/21/2020 at 6:34 pm to provider [facility] , who verbally acknowledged these results. IMPRESSION: CT head: 1. There is edema which appears predominant ly vasogenic within portions of the left temporal, parietal and occipital lobes. Findings are most suspicious for an underlying mass. Contrast-enhanced brain MRI is recommended for further characterization. Redemonstrated small chronic parenchymal and tentorial calcifications in this region. 2. Regional mass effect with partial effacement of the left lateral ventricle. Associated prominence of the left temporal horn likely reflecting ventricular entrapment. CT cervical spine: No evidence of acute fracture to the cervical spine. Electronically Signed   By: Kellie Simmering DO   On: 05/21/2020 18:35   MR Brain W and Wo Contrast  Result Date: 05/22/2020 CLINICAL DATA:   Abnormal CT head EXAM: MRI HEAD WITHOUT AND WITH CONTRAST TECHNIQUE: Multiplanar, multiecho pulse sequences of the brain and surrounding structures were obtained without and with intravenous contrast. CONTRAST:  85m GADAVIST GADOBUTROL 1 MMOL/ML IV SOLN COMPARISON:  Correlation made with prior head CT FINDINGS: Brain: Heterogeneous, enhancing mass centered within the posterior left temporal lobe measuring approximately 4.2 x 3.4 x 2.9 cm. Foci of susceptibility are present corresponding to mineralization on CT. There is minor surrounding edema. Regional mass effect is unchanged with trapping of the left temporal horn. No additional mass or abnormal enhancement. No acute infarction or hemorrhage. Vascular: Major vessel flow voids at the skull base are preserved. Skull and upper cervical spine: Normal marrow signal. Sinuses/Orbits: Minor mucosal thickening.  Orbits are unremarkable. Other: Mastoid air cells clear. IMPRESSION: Posterior left temporal lobe mass as described with mild trapping of the left temporal horn. This likely reflects a slow growing primary neoplasm, noting calcification was present on prior studies. Primary differential considerations include ganglioglioma and oligodendroglioma or uncommon location of pilocytic astrocytoma. Electronically Signed   By: PMacy MisM.D.   On: 05/22/2020 15:20   DG Chest  Portable 1 View  Result Date: 05/21/2020 CLINICAL DATA:  Seizure, altered mental status. EXAM: PORTABLE CHEST 1 VIEW COMPARISON:  None. FINDINGS: The lungs are hyperinflated. There is no evidence of acute infiltrate, pleural effusion or pneumothorax. The heart size and mediastinal contours are within normal limits. The visualized skeletal structures are unremarkable. IMPRESSION: No active disease. Electronically Signed   By: Virgina Norfolk M.D.   On: 05/21/2020 16:26    Assessment/Plan Focal seizures ([place]) [R56.9]  We appreciate the opportunity to participate in the care of TUJUANA KILMARTIN.  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  Histology may be low grade glioma such as ganglioglioma, DNET or oligodendroglioma.  We extensively reviewed treatment recommendations discussed in brain/spine tumor board meeting from this week; ultimately we feel that most extensive possible, safe resection is best path forward.  Consultation will be arranged with Dr. Duffy Rhody of [facility].  Further pre-operative screening such as functional MRI may need to be performed.  Screening for potential clinical trials was performed and discussed using eligibility criteria for active protocols at [facility], loco-regional tertiary centers, as well as national database available on [email or web address].    The patient is not a candidate for a research protocol at this time due to no suitable study identified.   We spent twenty additional minutes teaching regarding the natural history, biology, and historical experience in the treatment of brain tumors. We then discussed in detail the current recommendations for therapy focusing on the mode of administration, mechanism of action, anticipated toxicities, and quality of life issues associated with this plan. We also provided teaching sheets for the patient to take home as an additional resource.  We are happy to follow up with Ms. Maj following surgery.  In the meantime we recommend she continue Trileptal 111m BID for seizure prevention.  All questions were answered. The patient knows to call the clinic with any problems, questions or concerns. No barriers to learning were detected.  The total time spent in the encounter was 60 minutes and more than 50% was on counseling and review of test results   ZVentura Sellers MD Medical Director of Neuro-Oncology [facility]07/01/21 9:38 AM

## 2020-06-18 ENCOUNTER — Other Ambulatory Visit: Payer: Self-pay | Admitting: Neurosurgery

## 2020-06-18 ENCOUNTER — Other Ambulatory Visit: Payer: Self-pay | Admitting: *Deleted

## 2020-06-18 DIAGNOSIS — G9389 Other specified disorders of brain: Secondary | ICD-10-CM

## 2020-06-19 ENCOUNTER — Telehealth: Payer: Self-pay | Admitting: *Deleted

## 2020-06-19 ENCOUNTER — Other Ambulatory Visit: Payer: Self-pay | Admitting: Neurosurgery

## 2020-06-19 ENCOUNTER — Other Ambulatory Visit (HOSPITAL_COMMUNITY): Payer: Self-pay | Admitting: Neurosurgery

## 2020-06-19 DIAGNOSIS — D496 Neoplasm of unspecified behavior of brain: Secondary | ICD-10-CM

## 2020-06-19 NOTE — Telephone Encounter (Signed)
[place] Work  Clinical Social Work was referred by neuro-oncology RN for assessment of psychosocial needs- requested to call patient's father.  Clinical Social Worker attempted to contact patient's father  to offer support and assess for needs.  CSW left VM to return call.   Gwinda Maine, LCSW  Clinical Social Worker [facility]

## 2020-06-20 ENCOUNTER — Telehealth: Payer: Self-pay | Admitting: *Deleted

## 2020-06-20 NOTE — Telephone Encounter (Signed)
Sonya Prince  Clinical Social Prince made first and second attempt to contact patient's father regarding questions about insurance coverage.  CSW spoke with patient's father, Sonya Prince- he stated he is assisting daughter with application process.  He shared the patient resides at his home and currently is uninsured.  Due to her medical situation, she is unable to Prince at this time.   CSW contacted [facility] financial counseling team regarding referral for assistance with medicaid, disability, and financial assistance.  [facility] financial counselor will make appropriate referral.  CSW informed patient's father.   Gwinda Maine, LCSW  Clinical Social Worker [facility]

## 2020-06-21 ENCOUNTER — Other Ambulatory Visit: Payer: Self-pay

## 2020-06-21 ENCOUNTER — Ambulatory Visit (HOSPITAL_COMMUNITY)
Admission: RE | Admit: 2020-06-21 | Discharge: 2020-06-21 | Disposition: A | Discharge status: Home / Self Care | Payer: Self-pay | Source: Ambulatory Visit | Attending: Neurosurgery | Admitting: Neurosurgery

## 2020-06-21 ENCOUNTER — Other Ambulatory Visit (HOSPITAL_COMMUNITY): Payer: Self-pay | Admitting: Neurosurgery

## 2020-06-21 ENCOUNTER — Encounter (HOSPITAL_COMMUNITY): Payer: Self-pay

## 2020-06-21 ENCOUNTER — Ambulatory Visit (HOSPITAL_COMMUNITY): Admission: RE | Admit: 2020-06-21 | Payer: Self-pay | Source: Ambulatory Visit

## 2020-06-21 DIAGNOSIS — D496 Neoplasm of unspecified behavior of brain: Secondary | ICD-10-CM

## 2020-06-21 IMAGING — MR MR HEAD WO/W CM
14 of 15 series · 34 of 48 positions shown · IV contrast (gadavist)
Comparison: [DATE]

CLINICAL DATA: Brain tumor. Stereotactic planning protocol. Left
temporal mass with seizures.

EXAM:
MRI HEAD WITHOUT AND WITH CONTRAST
TECHNIQUE: Multiplanar, multiecho pulse sequences of the brain and surrounding
structures were obtained without and with intravenous contrast.
CONTRAST:  5.1mL GADAVIST GADOBUTROL 1 MMOL/ML IV SOLN

[Series 2: FLAIR · sagittal · 3.0mm · 0.47mm/px · 1 of 43 slices shown (1 of 2)]
[im 1/43]
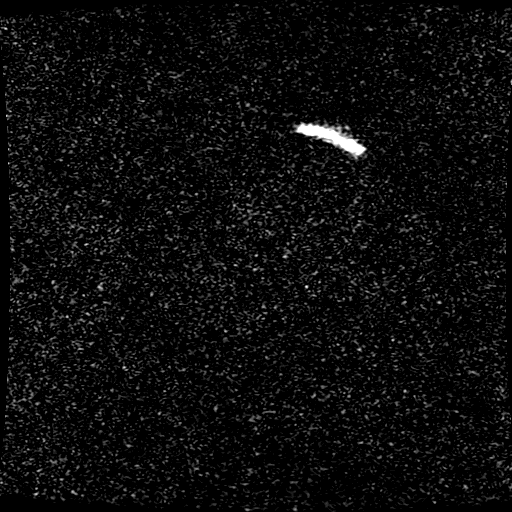

[Series 3: T2 · axial · 5.0mm · 0.43mm/px · 1 of 26 slices shown]
[im 1/26]
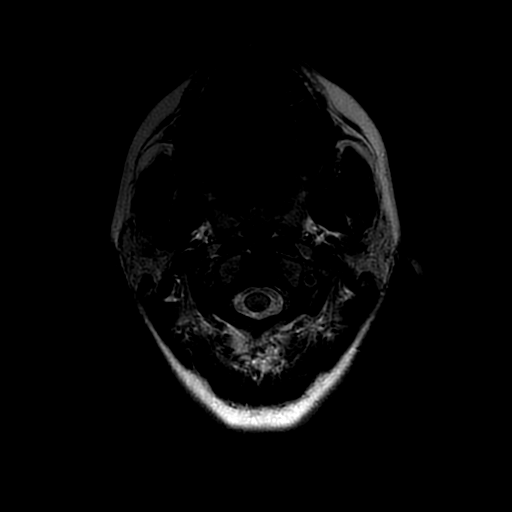

[Series 4: ax dti · axial · 3.0mm · 0.94mm/px · z∈[-91,+86]mm · 12 of 1612 slices shown]
[im 1/1612]
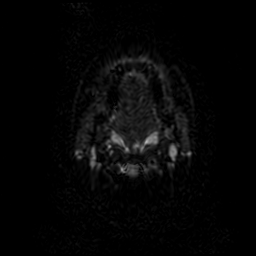
[im 77/1612]
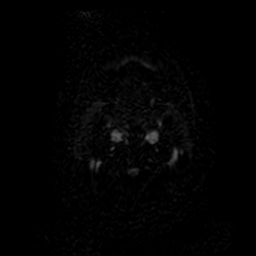
[im 154/1612]
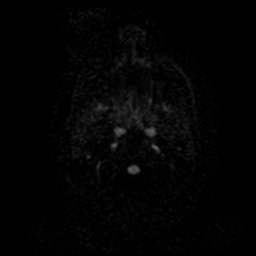
[im 231/1612]
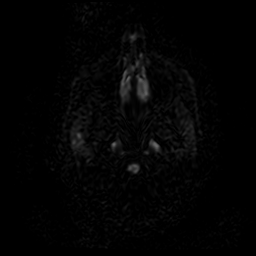
[im 307/1612]
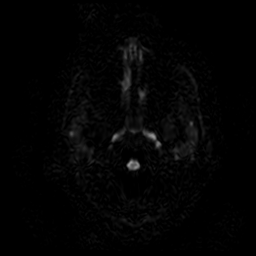
[im 384/1612]
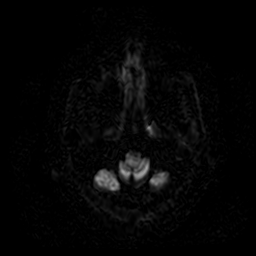
[im 461/1612]
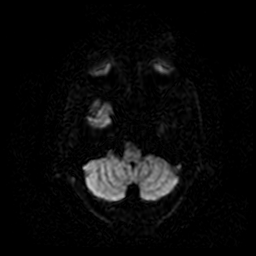
[im 691/1612]
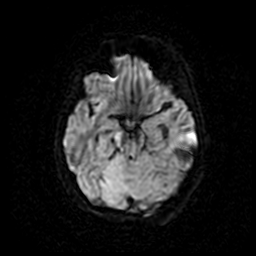
[im 921/1612]
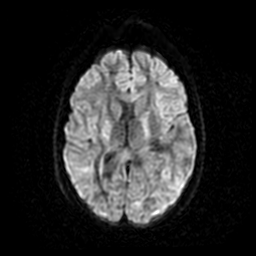
[im 1151/1612]
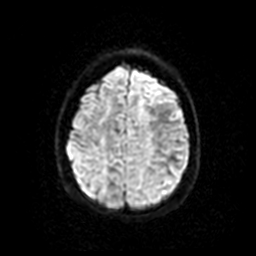
[im 1305/1612]
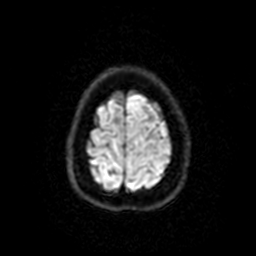
[im 1535/1612]
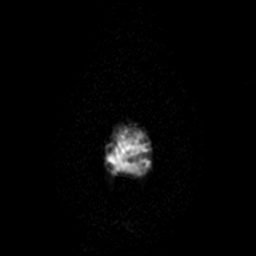

[Series 6: (person_name) · axial · 3.0mm · 0.47mm/px · 1 of 100 slices shown]
[im 1/100]
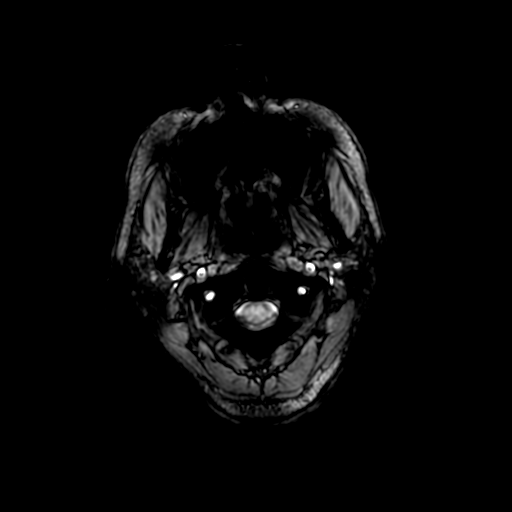

[Series 7: ax 3(person_name) · axial · 1.0mm · 1.02mm/px · z∈[-130,+93]mm · 3 of 224 slices shown (1 of 2)]
[im 1/224]
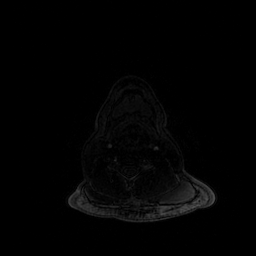
[im 112/224]
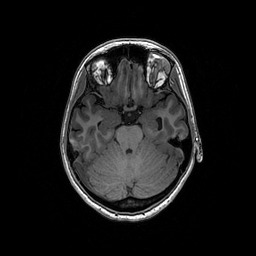
[im 224/224]
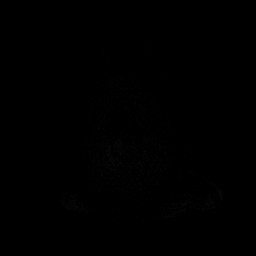

[Series 8: T2 post-contrast · coronal · 3.0mm · 0.39mm/px · 1 of 56 slices shown]
[im 1/56]
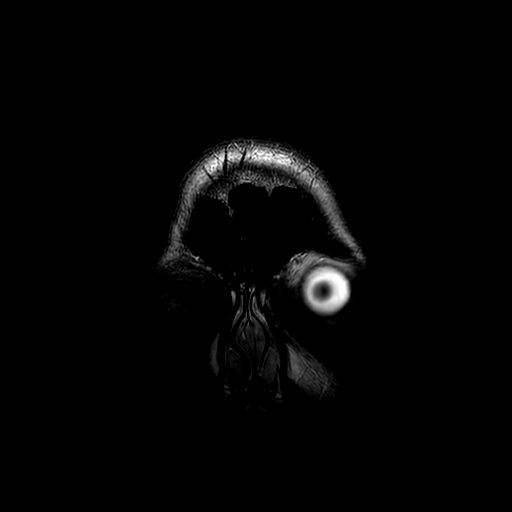

[Series 9: ax 3(person_name) · axial · 1.0mm · 1.02mm/px · z∈[-130,+93]mm · 3 of 224 slices shown (2 of 2)]
[im 1/224]
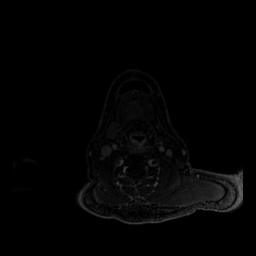
[im 112/224]
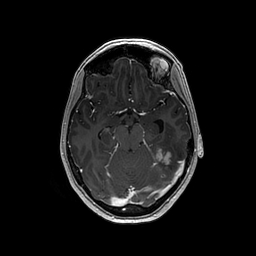
[im 224/224]
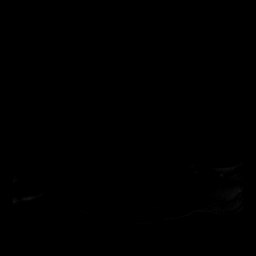

[Series 10: T1 · coronal · 3.0mm · 0.39mm/px · 1 of 56 slices shown]
[im 1/56]
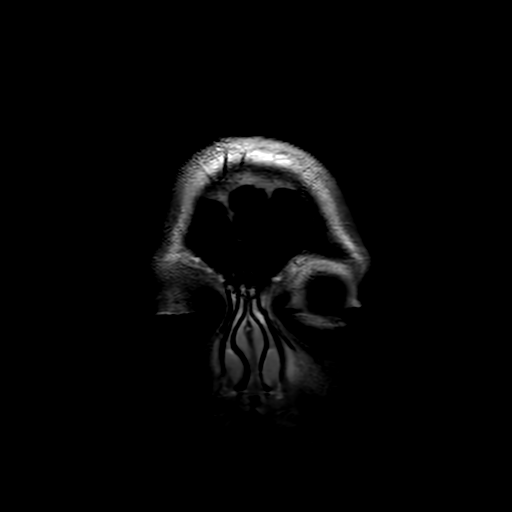

[Series 11: FLAIR · sagittal · 3.0mm · 0.47mm/px · 1 of 43 slices shown (2 of 2)]
[im 1/43]
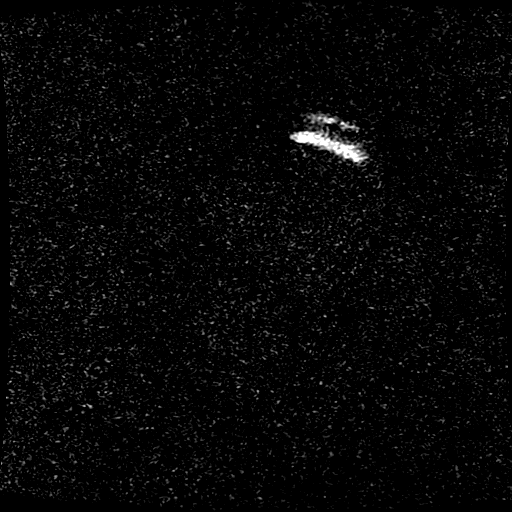

[Series 450: trace · axial · 3.0mm · 0.94mm/px · 1 of 61 slices shown]
[im 1/61]
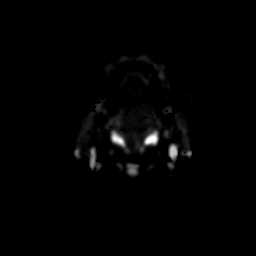

[Series 451: fa(no-q) · axial · 3.0mm · 0.94mm/px · 1 of 55 slices shown]
[im 1/55]
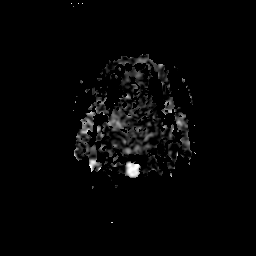

[Series 452: avdc (10^-6 mm²/s)(no-q) · axial · 3.0mm · 0.94mm/px · 1 of 61 slices shown]
[im 1/61]
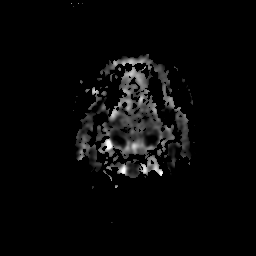

[Series 500: multiplanar reconstruction (mpr) · axial · 1.0mm · 0.50mm/px · z∈[-147,+109]mm · 4 of 257 slices shown (1 of 2)]
[im 1/257]
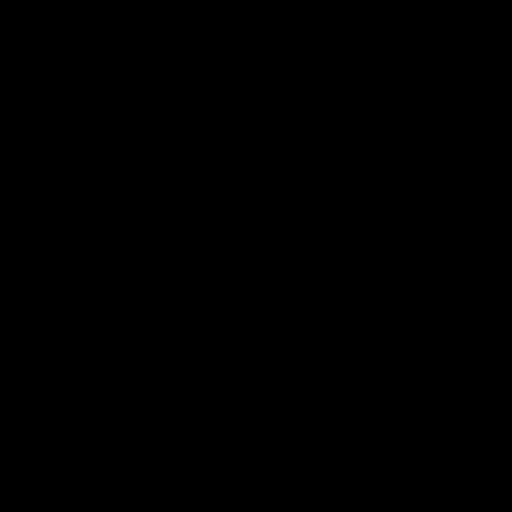
[im 86/257]
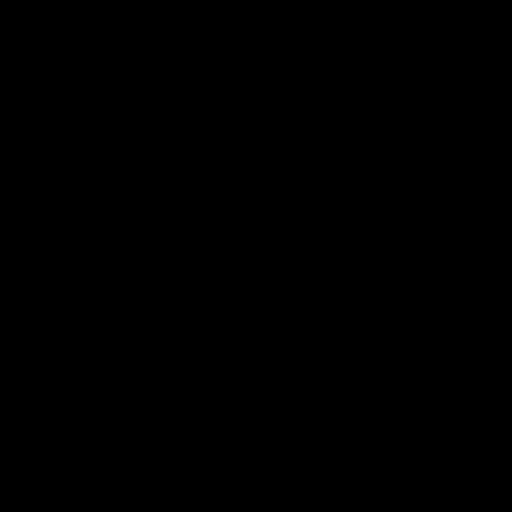
[im 171/257]
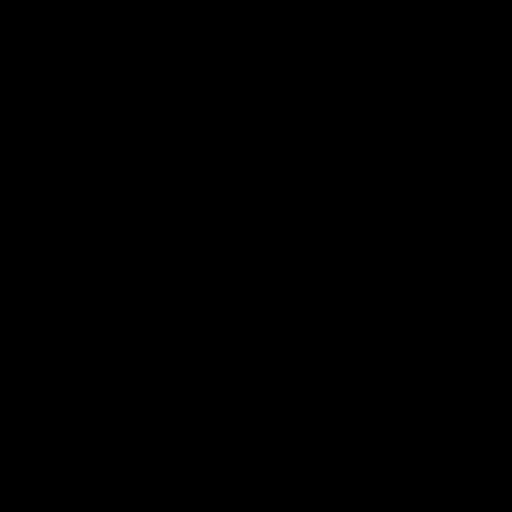
[im 257/257]
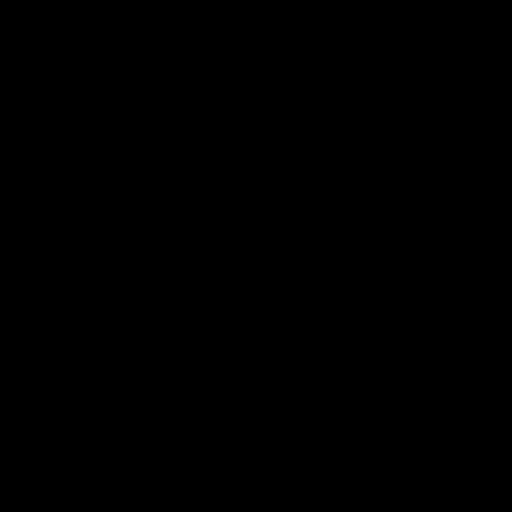

[Series 501: multiplanar reconstruction (mpr) · coronal · 1.0mm · 0.50mm/px · 3 of 244 slices shown (2 of 2)]
[im 1/244]
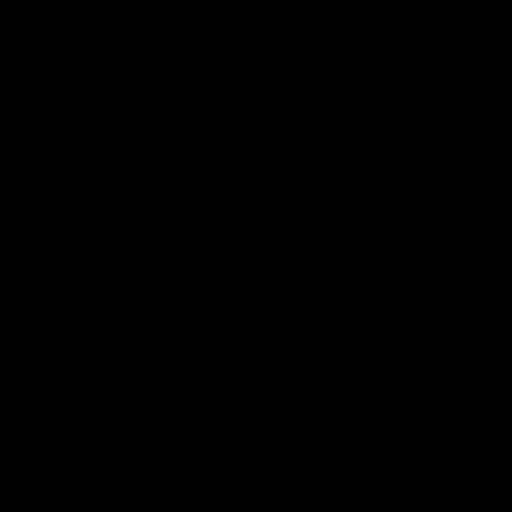
[im 122/244]
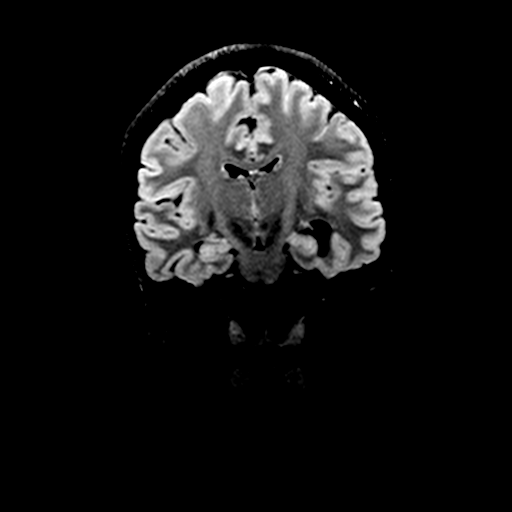
[im 244/244]
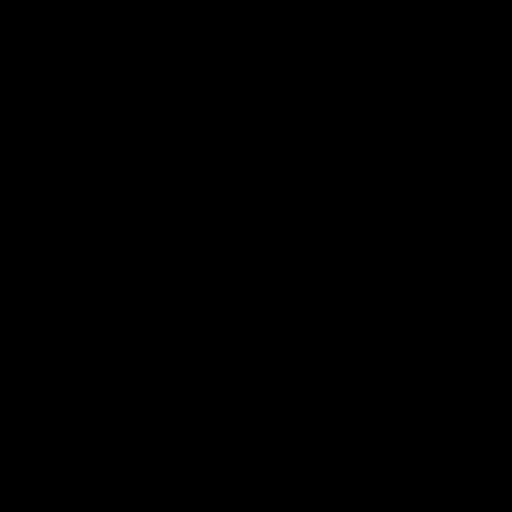

[34 of 48 positions shown; findings below may reference images not displayed]

FINDINGS: Brain: A heterogeneous, enhancing mass centered in the posterior
left temporal lobe measures 4.9 x 3.7 x 2.8 cm (AP x transverse x
craniocaudal), not significantly changed upon remeasurement.
Associated mineralization is again noted. There is mild surrounding
edema. Mass effect is again noted on the left lateral ventricle with
unchanged mild dilatation of the temporal horn compatible with
trapping. No second mass is identified. The brain is normal in
signal elsewhere. No acute infarct, midline shift, or extra-axial
fluid collection is identified.

Vascular: Major intracranial vascular flow voids are preserved.

Skull and upper cervical spine: Unremarkable bone marrow signal.

Sinuses/Orbits: Unremarkable orbits. Paranasal sinuses and mastoid
air cells are clear.

Other: None.
IMPRESSION: Unchanged posterior left temporal lobe mass.

## 2020-06-21 MED ORDER — GADOBUTROL 1 MMOL/ML IV SOLN
5.1000 mL | Freq: Once | INTRAVENOUS | Status: AC | PRN
  Administered 2020-06-21: 5.1 mL via INTRAVENOUS

## 2020-06-21 NOTE — Progress Notes (Signed)
[place], [place] [address] Phone: [phone] Fax: [phone]      Your procedure is scheduled on Wednesday, July 28th.  Report to [facility] Main Entrance "A" at 9:00 A.M., and check in at the Admitting office.  Call this number if you have problems the morning of surgery:  [phone]  Call [phone] if you have any questions prior to your surgery date Monday-Friday 8am-4pm    Remember:  Do not eat or drink after midnight the night before your surgery     Take these medicines the morning of surgery with A SIP OF WATER   Clonazepam (Klonopin)  Fluoxetine (Prozac)  Oxcarbazepine (Trleptal)  As of today, STOP taking any Aspirin (unless otherwise instructed by your surgeon) Aleve, Naproxen, Ibuprofen, Motrin, Advil, Goody's, BC's, all herbal medications, fish oil, and all vitamins.                      Do not wear jewelry, make up, or nail polish            Do not wear lotions, powders, perfumes, or deodorant.            Do not shave 48 hours prior to surgery.              Do not bring valuables to the hospital.            [facility] is not responsible for any belongings or valuables.  Do NOT Smoke (Tobacco/Vaping) or drink Alcohol 24 hours prior to your procedure If you use a CPAP at night, you may bring all equipment for your overnight stay.   Contacts, glasses, dentures or bridgework may not be worn into surgery.      For patients admitted to the hospital, discharge time will be determined by your treatment team.   Patients discharged the day of surgery will not be allowed to drive home, and someone needs to stay with them for 24 hours.    Special instructions:   [place]- Preparing For Surgery  Before surgery, you can play an important role. Because skin is not sterile, your skin needs to be as free of germs as possible. You can reduce the number of germs on your skin by  washing with CHG (chlorahexidine gluconate) Soap before surgery.  CHG is an antiseptic cleaner which kills germs and bonds with the skin to continue killing germs even after washing.    Oral Hygiene is also important to reduce your risk of infection.  Remember - BRUSH YOUR TEETH THE MORNING OF SURGERY WITH YOUR REGULAR TOOTHPASTE  Please do not use if you have an allergy to CHG or antibacterial soaps. If your skin becomes reddened/irritated stop using the CHG.  Do not shave (including legs and underarms) for at least 48 hours prior to first CHG shower. It is OK to shave your face.  Please follow these instructions carefully.   1. Shower the NIGHT BEFORE SURGERY and the MORNING OF SURGERY with CHG Soap.   2. If you chose to wash your hair, wash your hair first as usual with your normal shampoo.  3. After you shampoo, rinse your hair and body thoroughly to remove the shampoo.  4. Use CHG as you would any other liquid soap. You can apply CHG directly to the skin and wash gently with a scrungie or a clean washcloth.   5. Apply the CHG Soap to your  body ONLY FROM THE NECK DOWN.  Do not use on open wounds or open sores. Avoid contact with your eyes, ears, mouth and genitals (private parts). Wash Face and genitals (private parts)  with your normal soap.   6. Wash thoroughly, paying special attention to the area where your surgery will be performed.  7. Thoroughly rinse your body with warm water from the neck down.  8. DO NOT shower/wash with your normal soap after using and rinsing off the CHG Soap.  9. Pat yourself dry with a CLEAN TOWEL.  10. Wear CLEAN PAJAMAS to bed the night before surgery  11. Place CLEAN SHEETS on your bed the night of your first shower and DO NOT SLEEP WITH PETS.   Day of Surgery: Wear Clean/Comfortable clothing the morning of surgery Do not apply any deodorants/lotions.   Remember to brush your teeth WITH YOUR REGULAR TOOTHPASTE.   Please read over the  following fact sheets that you were given.

## 2020-06-22 ENCOUNTER — Other Ambulatory Visit: Payer: Self-pay | Admitting: Neurosurgery

## 2020-06-22 ENCOUNTER — Encounter (HOSPITAL_COMMUNITY)
Admission: RE | Admit: 2020-06-22 | Discharge: 2020-06-22 | Disposition: A | Discharge status: Home / Self Care | Payer: Self-pay | Source: Ambulatory Visit | Attending: Neurosurgery | Admitting: Neurosurgery

## 2020-06-22 ENCOUNTER — Encounter (HOSPITAL_COMMUNITY): Payer: Self-pay

## 2020-06-22 ENCOUNTER — Other Ambulatory Visit: Payer: Self-pay

## 2020-06-22 DIAGNOSIS — Z01818 Encounter for other preprocedural examination: Secondary | ICD-10-CM | POA: Insufficient documentation

## 2020-06-22 HISTORY — DX: Bipolar disorder, unspecified: F31.9

## 2020-06-22 HISTORY — DX: Unspecified convulsions: R56.9

## 2020-06-22 NOTE — Progress Notes (Signed)
Medical history questions asked and completed.Patient became agitated during appointment while answering questions. Patient used obscenities and stated she does not want to have procedure done nor does she want to have labs drawn. Patient abruptly left in the middle of the appointment.  Appointment was not completed, labs were not drawn, and pre-op instructions were not given. Surgical coordinator/scheduler Lorriane Shire at surgeon's office Lorriane Shire made aware via telephone call.

## 2020-06-23 ENCOUNTER — Other Ambulatory Visit (HOSPITAL_COMMUNITY)
Admission: RE | Admit: 2020-06-23 | Discharge: 2020-06-23 | Disposition: A | Discharge status: Home / Self Care | Payer: HRSA Program | Source: Ambulatory Visit | Attending: Neurosurgery | Admitting: Neurosurgery

## 2020-06-23 DIAGNOSIS — Z20822 Contact with and (suspected) exposure to covid-19: Secondary | ICD-10-CM | POA: Diagnosis not present

## 2020-06-23 DIAGNOSIS — Z01812 Encounter for preprocedural laboratory examination: Secondary | ICD-10-CM | POA: Diagnosis present

## 2020-06-23 LAB — SARS CORONAVIRUS 2 (TAT 6-24 HRS): SARS Coronavirus 2: NEGATIVE

## 2020-06-25 ENCOUNTER — Encounter (HOSPITAL_COMMUNITY): Payer: Self-pay | Admitting: Physician Assistant

## 2020-06-25 ENCOUNTER — Encounter (HOSPITAL_COMMUNITY): Payer: Self-pay | Admitting: Vascular Surgery

## 2020-06-25 ENCOUNTER — Other Ambulatory Visit (HOSPITAL_COMMUNITY): Payer: Self-pay

## 2020-06-26 ENCOUNTER — Encounter (HOSPITAL_COMMUNITY): Payer: Self-pay | Admitting: *Deleted

## 2020-06-26 ENCOUNTER — Other Ambulatory Visit: Payer: Self-pay

## 2020-06-26 NOTE — Progress Notes (Signed)
Anesthesia Chart Review: SAME DAY WORK-UP   Case: [id number] Date/Time: 06/27/20 1045   Procedures:      STEREOTACTIC CRANIOTOMY, LEFT , TEMPORAL FOR TUMOR RESECTION (Left )     APPLICATION OF CRANIAL NAVIGATION (Left )   Anesthesia type: General   Pre-op diagnosis: BRAIN TUMOR   Location: [facility] OR ROOM 20 / [place] OR   Surgeons: Vallarie Mare, MD      DISCUSSION: Patient is a 29 year old female scheduled for the above procedure.  She came in for preadmission testing on 06/22/2020 however became agitated during the visit, used obscenities and stated she did not want to have the procedure done or labs drawn.  She left in the middle of the appointment.   History includes smoking, meningitis, anxiety, PTSD, seizures, left temporal lobe mass.   In talking with surgical scheduler Lexine Baton, patient has a history of outburst, and at this point unsure how much is related to her tumor. Her parents are involved in her care and are trying to make sure she goes to her appointments. She did complete her preoperative COVID 19 test on 06/23/20. Dr. Marcello Moores is going to prescribe Valium for her to take on the morning of surgery in hopes this will help with her anxiety about coming in for surgery. Reportedly she is not being compliant with taking Trileptal for seizure prophylaxis.  Anesthesia team to evaluate on the day of surgery.   VS: LMP 06/18/2020 (Approximate)  06/22/20: BP 108/73, HR 74, RR 18, O2 sat 100%.  PROVIDERS: Patient, No Pcp Per Cecil Cobbs, MD is [place]. 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   LABS: For day of procedure. Labs on 05/22/20 showed Cr 0.81, glucose 92, H/H 13.0/39.5, PLT 176.    IMAGES: MRI Brain 06/21/20: FINDINGS: Brain: A heterogeneous, enhancing mass centered  in the posterior left temporal lobe measures 4.9 x 3.7 x 2.8 cm (AP x transverse x craniocaudal), not significantly changed upon remeasurement. Associated mineralization is again noted. There is mild surrounding edema. Mass effect is again noted on the left lateral ventricle with unchanged mild dilatation of the temporal horn compatible with trapping. No second mass is identified. The brain is normal in signal elsewhere. No acute infarct, midline shift, or extra-axial fluid collection is identified.  Vascular: Major intracranial vascular flow voids are preserved.  Skull and upper cervical spine: Unremarkable bone marrow signal.  Sinuses/Orbits: Unremarkable orbits. Paranasal sinuses and mastoid air cells are clear.  Other: None.  IMPRESSION: Unchanged posterior left temporal lobe mass.    EKG: 06/26/20: Sinus tachycardia Borderline right axis deviation Borderline T wave abnormalities Baseline wander in lead(s) V3 Confirmed by Davonna Belling [phone]) on 05/21/2020 5:58:16 PM  CV: N/A  Past Medical History:  Diagnosis Date  . Anxiety   . Bipolar disorder [facility])    Father denies diagnosis  . Mass of left temporal lobe   . Meningitis   . PTSD (post-traumatic stress disorder)   . Seizures ([place])     Past Surgical History:  Procedure Laterality Date  . OPEN REDUCTION INTERNAL FIXATION (ORIF) METACARPAL Right 12/19/2019   Procedure: OPEN TREATMENT OF RIGHT 5TH METACARPAL FRACTURE;  Surgeon: Milly Jakob, MD;  Location: [facility];  Service: Orthopedics;  Laterality: Right;  WITH PREOP REGIONAL BLOCK  . WISDOM TOOTH EXTRACTION      MEDICATIONS:  No current facility-administered medications for this encounter.   Marland Kitchen amphetamine-dextroamphetamine (ADDERALL) 20 MG tablet  . clonazePAM (KLONOPIN) 0.5 MG tablet  . FLUoxetine (PROZAC) 20 MG capsule  . OXcarbazepine (TRILEPTAL) 150 MG tablet     Myra Gianotti, PA-C Surgical Short  Stay/Anesthesiology [facility] Phone [phone] [facility] Phone [phone] 06/26/2020 4:39 PM

## 2020-06-26 NOTE — Anesthesia Preprocedure Evaluation (Deleted)
Anesthesia Evaluation    Airway        Dental   Pulmonary Current Smoker,           Cardiovascular      Neuro/Psych    GI/Hepatic   Endo/Other    Renal/GU      Musculoskeletal   Abdominal   Peds  Hematology   Anesthesia Other Findings   Reproductive/Obstetrics                             Anesthesia Physical Anesthesia Plan  ASA:   Anesthesia Plan:    Post-op Pain Management:    Induction:   PONV Risk Score and Plan:   Airway Management Planned:   Additional Equipment:   Intra-op Plan:   Post-operative Plan:   Informed Consent:   Plan Discussed with:   Anesthesia Plan Comments: (See PAT note written 06/26/2020 by Myra Gianotti, PA-C. Dr. Marcello Moores is prescribing Valium for her to take on the morning of surgery.  )        Anesthesia Quick Evaluation

## 2020-06-26 NOTE — Progress Notes (Signed)
Pre-op instructions provided to pt father, [name] [facility]). Father denies that pt C/O SOB and chest pain. Father denies that pt is under the care of a cardiologist and PCP. Father denies that pt had a stress test, echo and cardiac cath. Father stated that pt has not been compliant with medications. Father made aware to have pt not smoke today. Father made aware to have pt stop taking vitamins, fish oil and herbal medications. Do not take any NSAIDs ie: Ibuprofen, Advil, Naproxen (Aleve), Motrin, BC and Goody Powder. Father reminded to have pt to continue to quarantine. Father verbalized understanding of all pre-op instructions. PA, Anesthesiology, made aware of pt history.

## 2020-06-27 ENCOUNTER — Encounter (HOSPITAL_COMMUNITY): Payer: Self-pay

## 2020-06-27 ENCOUNTER — Ambulatory Visit (HOSPITAL_COMMUNITY)
Admission: RE | Admit: 2020-06-27 | Discharge: 2020-06-27 | Disposition: A | Discharge status: Home / Self Care | Payer: Self-pay | Attending: Neurosurgery | Admitting: Neurosurgery

## 2020-06-27 ENCOUNTER — Encounter (HOSPITAL_COMMUNITY)
Admission: RE | Disposition: A | Discharge status: Home / Self Care | Payer: Self-pay | Source: Home / Self Care | Attending: Neurosurgery

## 2020-06-27 ENCOUNTER — Other Ambulatory Visit: Payer: Self-pay

## 2020-06-27 DIAGNOSIS — Z01812 Encounter for preprocedural laboratory examination: Secondary | ICD-10-CM | POA: Insufficient documentation

## 2020-06-27 DIAGNOSIS — F17210 Nicotine dependence, cigarettes, uncomplicated: Secondary | ICD-10-CM | POA: Insufficient documentation

## 2020-06-27 DIAGNOSIS — F419 Anxiety disorder, unspecified: Secondary | ICD-10-CM | POA: Insufficient documentation

## 2020-06-27 DIAGNOSIS — D496 Neoplasm of unspecified behavior of brain: Secondary | ICD-10-CM | POA: Insufficient documentation

## 2020-06-27 DIAGNOSIS — F319 Bipolar disorder, unspecified: Secondary | ICD-10-CM | POA: Insufficient documentation

## 2020-06-27 DIAGNOSIS — Z79899 Other long term (current) drug therapy: Secondary | ICD-10-CM | POA: Insufficient documentation

## 2020-06-27 DIAGNOSIS — F4310 Post-traumatic stress disorder, unspecified: Secondary | ICD-10-CM | POA: Insufficient documentation

## 2020-06-27 HISTORY — DX: Other specified disorders of brain: G93.89

## 2020-06-27 LAB — POCT PREGNANCY, URINE: Preg Test, Ur: NEGATIVE

## 2020-06-27 SURGERY — CRANIOTOMY TUMOR EXCISION
Anesthesia: General | Laterality: Left

## 2020-06-27 MED ORDER — CHLORHEXIDINE GLUCONATE CLOTH 2 % EX PADS: 6.0000 | MEDICATED_PAD | Freq: Once | CUTANEOUS | Status: DC

## 2020-06-27 MED ORDER — MIDAZOLAM HCL 2 MG/2ML IJ SOLN
INTRAMUSCULAR | Status: AC
  Filled 2020-06-27: qty 2

## 2020-06-27 MED ORDER — CEFAZOLIN SODIUM-DEXTROSE 2-4 GM/100ML-% IV SOLN: 2.0000 g | INTRAVENOUS | Status: DC

## 2020-06-27 MED ORDER — ACETAMINOPHEN 500 MG PO TABS
ORAL_TABLET | ORAL | Status: AC
  Administered 2020-06-27: 1000 mg via ORAL
  Filled 2020-06-27: qty 2

## 2020-06-27 MED ORDER — CHLORHEXIDINE GLUCONATE 0.12 % MT SOLN: 15.0000 mL | Freq: Once | OROMUCOSAL | Status: DC

## 2020-06-27 MED ORDER — LIDOCAINE-PRILOCAINE 2.5-2.5 % EX CREA: TOPICAL_CREAM | CUTANEOUS | Status: DC

## 2020-06-27 MED ORDER — ORAL CARE MOUTH RINSE: 15.0000 mL | Freq: Once | OROMUCOSAL | Status: DC

## 2020-06-27 MED ORDER — ACETAMINOPHEN 500 MG PO TABS: 1000.0000 mg | ORAL_TABLET | Freq: Once | ORAL | Status: AC

## 2020-06-27 MED ORDER — DEXMEDETOMIDINE (PRECEDEX) IN NS 20 MCG/5ML (4 MCG/ML) IV SYRINGE
PREFILLED_SYRINGE | INTRAVENOUS | Status: AC
  Filled 2020-06-27: qty 5

## 2020-06-27 MED ORDER — CEFAZOLIN SODIUM-DEXTROSE 2-4 GM/100ML-% IV SOLN
INTRAVENOUS | Status: AC
  Filled 2020-06-27: qty 100

## 2020-06-27 MED ORDER — CHLORHEXIDINE GLUCONATE 0.12 % MT SOLN
OROMUCOSAL | Status: AC
  Filled 2020-06-27: qty 15

## 2020-06-27 MED ORDER — PROPOFOL 10 MG/ML IV BOLUS
INTRAVENOUS | Status: AC
  Filled 2020-06-27: qty 40

## 2020-06-27 MED ORDER — FENTANYL CITRATE (PF) 250 MCG/5ML IJ SOLN
INTRAMUSCULAR | Status: AC
  Filled 2020-06-27: qty 5

## 2020-06-27 MED ORDER — SODIUM CHLORIDE 0.9 % IV SOLN: INTRAVENOUS | Status: DC

## 2020-06-27 NOTE — Progress Notes (Signed)
Pt states "I can't fucking do this. I'm leaving." and left the unit in a hospital gown, carrying her personal belongings.  Anesthesia notified.

## 2020-06-27 NOTE — Progress Notes (Signed)
Patient with labile mood on arrival, alternating between leaving AMA and proceeding with surgery.  Dr. Marcello Moores notified.  Pt agreed to IV insertion and lab draw, but then declined again during attempt.  Will continue to monitor.

## 2020-06-27 NOTE — Progress Notes (Signed)
After family discussion with Dr. Marcello Moores, patient decided to reschedule surgery. Patient left towards main entrance with parents accompanying her.

## 2020-06-28 ENCOUNTER — Other Ambulatory Visit: Payer: Self-pay | Admitting: Neurosurgery

## 2020-06-29 NOTE — Anesthesia Preprocedure Evaluation (Addendum)
Anesthesia Evaluation  Patient identified by MRN, date of birth, ID band Patient awake    Reviewed: Allergy & Precautions, H&P , NPO status , Patient's Chart, lab work & pertinent test results  Airway Mallampati: II  TM Distance: >3 FB Neck ROM: Full    Dental no notable dental hx. (+) Teeth Intact, Dental Advisory Given   Pulmonary Current Smoker and Patient abstained from smoking.,    Pulmonary exam normal breath sounds clear to auscultation       Cardiovascular Exercise Tolerance: Good negative cardio ROS   Rhythm:Regular Rate:Normal     Neuro/Psych Seizures -, Well Controlled,  Anxiety Bipolar Disorder Brain tumor    GI/Hepatic negative GI ROS, Neg liver ROS,   Endo/Other  negative endocrine ROS  Renal/GU negative Renal ROS  negative genitourinary   Musculoskeletal   Abdominal   Peds  Hematology negative hematology ROS (+)   Anesthesia Other Findings   Reproductive/Obstetrics negative OB ROS                          Anesthesia Physical Anesthesia Plan  ASA: II  Anesthesia Plan: General   Post-op Pain Management:    Induction: Intravenous  PONV Risk Score and Plan: 3 and Ondansetron, Dexamethasone and Midazolam  Airway Management Planned: Oral ETT  Additional Equipment: Arterial line  Intra-op Plan:   Post-operative Plan: Post-operative intubation/ventilation  Informed Consent: I have reviewed the patients History and Physical, chart, labs and discussed the procedure including the risks, benefits and alternatives for the proposed anesthesia with the patient or authorized representative who has indicated his/her understanding and acceptance.     Dental advisory given  Plan Discussed with: CRNA and Surgeon  Anesthesia Plan Comments: (See notes by Myra Gianotti, PA-C regarding previous challenging behavioral issues. Arriving 45 minutes prior to surgery minimize wait  time with plan to get labs under anesthesia. Defer timing of PIV to anesthesia team. )    Anesthesia Quick Evaluation

## 2020-06-29 NOTE — Progress Notes (Signed)
Anesthesia Update: See my previous note from Date of Service 06/26/20. Patient presented for craniotomy, but despite parental support was cursing at staff and refused IV, refused to sign consent, and left AMA. She is now rescheduled for 07/05/20 as a first case in attempts to minimize her wait time. Apparently, the longer she was in [place] and the more people she encountered, then the more agitated that she became.   Dr. Marcello Moores to try pre-medicating with Xanax prior to arrival and inquired about later arrival time and labs under anesthesia. Discussed with Hoy Morn, MD (who was to be her anesthesiologist on 06/27/20) who felt this would be a better option in this patient. She will need consent signed and PIV in Holding and can hopefully then go straight back to OR. Her parents believe she will be willing to do a presurgical COVID-19 test as out-patient, but if not then this will likely need to be done under anesthesia as well.  I have updated Rolla Flatten, RN and updated anesthesia communication board.   Myra Gianotti, PA-C Surgical Short Stay/Anesthesiology [facility] Phone [phone] [facility] Phone [phone] 06/29/2020 5:51 PM

## 2020-07-02 ENCOUNTER — Other Ambulatory Visit (HOSPITAL_COMMUNITY)
Admission: RE | Admit: 2020-07-02 | Discharge: 2020-07-02 | Disposition: A | Discharge status: Home / Self Care | Payer: HRSA Program | Source: Ambulatory Visit | Attending: Neurosurgery | Admitting: Neurosurgery

## 2020-07-02 DIAGNOSIS — Z20822 Contact with and (suspected) exposure to covid-19: Secondary | ICD-10-CM | POA: Insufficient documentation

## 2020-07-02 DIAGNOSIS — Z01812 Encounter for preprocedural laboratory examination: Secondary | ICD-10-CM | POA: Diagnosis present

## 2020-07-02 LAB — SARS CORONAVIRUS 2 (TAT 6-24 HRS): SARS Coronavirus 2: NEGATIVE

## 2020-07-04 NOTE — Progress Notes (Signed)
Pt father, Jerene Pitch, stated that pt history has not changed since previous assessment. Father stated that he still has the previous instructions that were provided during recent phone call. Father verbalized understanding of all pre-op instructions.

## 2020-07-05 ENCOUNTER — Encounter (HOSPITAL_COMMUNITY): Payer: Self-pay | Admitting: Certified Registered Nurse Anesthetist

## 2020-07-05 ENCOUNTER — Inpatient Hospital Stay (HOSPITAL_COMMUNITY): Payer: Self-pay

## 2020-07-05 ENCOUNTER — Other Ambulatory Visit: Payer: Self-pay

## 2020-07-05 ENCOUNTER — Inpatient Hospital Stay (HOSPITAL_COMMUNITY): Payer: Self-pay | Admitting: Vascular Surgery

## 2020-07-05 ENCOUNTER — Encounter (HOSPITAL_COMMUNITY)
Admission: RE | Disposition: A | Discharge status: Home / Self Care | Payer: Self-pay | Source: Home / Self Care | Attending: Neurosurgery

## 2020-07-05 ENCOUNTER — Inpatient Hospital Stay (HOSPITAL_COMMUNITY)
Admission: RE | Admit: 2020-07-05 | Discharge: 2020-07-06 | DRG: 027 | Disposition: A | Discharge status: Home / Self Care | Payer: Self-pay | Attending: Neurosurgery | Admitting: Neurosurgery

## 2020-07-05 DIAGNOSIS — D496 Neoplasm of unspecified behavior of brain: Principal | ICD-10-CM | POA: Diagnosis present

## 2020-07-05 DIAGNOSIS — Z419 Encounter for procedure for purposes other than remedying health state, unspecified: Secondary | ICD-10-CM

## 2020-07-05 DIAGNOSIS — Z881 Allergy status to other antibiotic agents status: Secondary | ICD-10-CM

## 2020-07-05 DIAGNOSIS — F419 Anxiety disorder, unspecified: Secondary | ICD-10-CM | POA: Diagnosis present

## 2020-07-05 DIAGNOSIS — F4310 Post-traumatic stress disorder, unspecified: Secondary | ICD-10-CM | POA: Diagnosis present

## 2020-07-05 DIAGNOSIS — F319 Bipolar disorder, unspecified: Secondary | ICD-10-CM | POA: Diagnosis present

## 2020-07-05 DIAGNOSIS — I959 Hypotension, unspecified: Secondary | ICD-10-CM | POA: Diagnosis not present

## 2020-07-05 DIAGNOSIS — Z978 Presence of other specified devices: Secondary | ICD-10-CM

## 2020-07-05 DIAGNOSIS — G40409 Other generalized epilepsy and epileptic syndromes, not intractable, without status epilepticus: Secondary | ICD-10-CM | POA: Diagnosis present

## 2020-07-05 DIAGNOSIS — F17210 Nicotine dependence, cigarettes, uncomplicated: Secondary | ICD-10-CM | POA: Diagnosis present

## 2020-07-05 DIAGNOSIS — Z79899 Other long term (current) drug therapy: Secondary | ICD-10-CM

## 2020-07-05 DIAGNOSIS — C719 Malignant neoplasm of brain, unspecified: Secondary | ICD-10-CM | POA: Diagnosis present

## 2020-07-05 HISTORY — PX: OPERATIVE ULTRASOUND: SHX5996

## 2020-07-05 HISTORY — PX: CRANIOTOMY: SHX93

## 2020-07-05 HISTORY — PX: APPLICATION OF CRANIAL NAVIGATION: SHX6578

## 2020-07-05 LAB — BASIC METABOLIC PANEL
Anion gap: 9 (ref 5–15)
BUN: 10 mg/dL (ref 6–20)
CO2: 23 mmol/L (ref 22–32)
Calcium: 8.9 mg/dL (ref 8.9–10.3)
Chloride: 105 mmol/L (ref 98–111)
Creatinine, Ser: 0.54 mg/dL (ref 0.44–1.00)
GFR calc Af Amer: >60 mL/min (ref >60)
GFR calc non Af Amer: >60 mL/min (ref >60)
Glucose, Bld: 99 mg/dL (ref 70–99)
Potassium: 3.5 mmol/L (ref 3.5–5.1)
Sodium: 137 mmol/L (ref 135–145)

## 2020-07-05 LAB — BLOOD GAS, ARTERIAL
Acid-base deficit: 3.9 mmol/L — ABNORMAL HIGH (ref 0.0–2.0)
Bicarbonate: 20 mmol/L (ref 20.0–28.0)
Drawn by: 533091
FIO2: 100
O2 Saturation: 99.7 %
Patient temperature: 37.4
pCO2 arterial: 32.8 mmHg (ref 32.0–48.0)
pH, Arterial: 7.404 (ref 7.350–7.450)
pO2, Arterial: 443 mmHg — ABNORMAL HIGH (ref 83.0–108.0)

## 2020-07-05 LAB — POCT I-STAT 7, (LYTES, BLD GAS, ICA,H+H)
Acid-Base Excess: 0 mmol/L (ref 0.0–2.0)
Bicarbonate: 23.7 mmol/L (ref 20.0–28.0)
Calcium, Ion: 1.16 mmol/L (ref 1.15–1.40)
HCT: 36 % (ref 36.0–46.0)
Hemoglobin: 12.2 g/dL (ref 12.0–15.0)
O2 Saturation: 100 %
Potassium: 3.5 mmol/L (ref 3.5–5.1)
Sodium: 137 mmol/L (ref 135–145)
TCO2: 25 mmol/L (ref 22–32)
pCO2 arterial: 33.8 mmHg (ref 32.0–48.0)
pH, Arterial: 7.453 — ABNORMAL HIGH (ref 7.350–7.450)
pO2, Arterial: 315 mmHg — ABNORMAL HIGH (ref 83.0–108.0)

## 2020-07-05 LAB — CBC
HCT: 36.7 % (ref 36.0–46.0)
Hemoglobin: 12.6 g/dL (ref 12.0–15.0)
MCH: 32.2 pg (ref 26.0–34.0)
MCHC: 34.3 g/dL (ref 30.0–36.0)
MCV: 93.9 fL (ref 80.0–100.0)
Platelets: 187 10*3/uL (ref 150–400)
RBC: 3.91 MIL/uL (ref 3.87–5.11)
RDW: 11.5 % (ref 11.5–15.5)
WBC: 7.1 10*3/uL (ref 4.0–10.5)
nRBC: 0 % (ref 0.0–0.2)

## 2020-07-05 LAB — ABO/RH: ABO/RH(D): O POS

## 2020-07-05 LAB — GLUCOSE, CAPILLARY: Glucose-Capillary: 137 mg/dL — ABNORMAL HIGH (ref 70–99)

## 2020-07-05 LAB — HCG, SERUM, QUALITATIVE: Preg, Serum: NEGATIVE

## 2020-07-05 LAB — TYPE AND SCREEN
ABO/RH(D): O POS
Antibody Screen: NEGATIVE

## 2020-07-05 LAB — TRIGLYCERIDES: Triglycerides: 32 mg/dL (ref <150)

## 2020-07-05 LAB — MRSA PCR SCREENING: MRSA by PCR: NEGATIVE

## 2020-07-05 IMAGING — DX DG CHEST 1V PORT
1 series · 1 of 1 positions shown · non-contrast
Comparison: [DATE]

CLINICAL DATA: ETT

EXAM:
PORTABLE CHEST 1 VIEW

[chest]
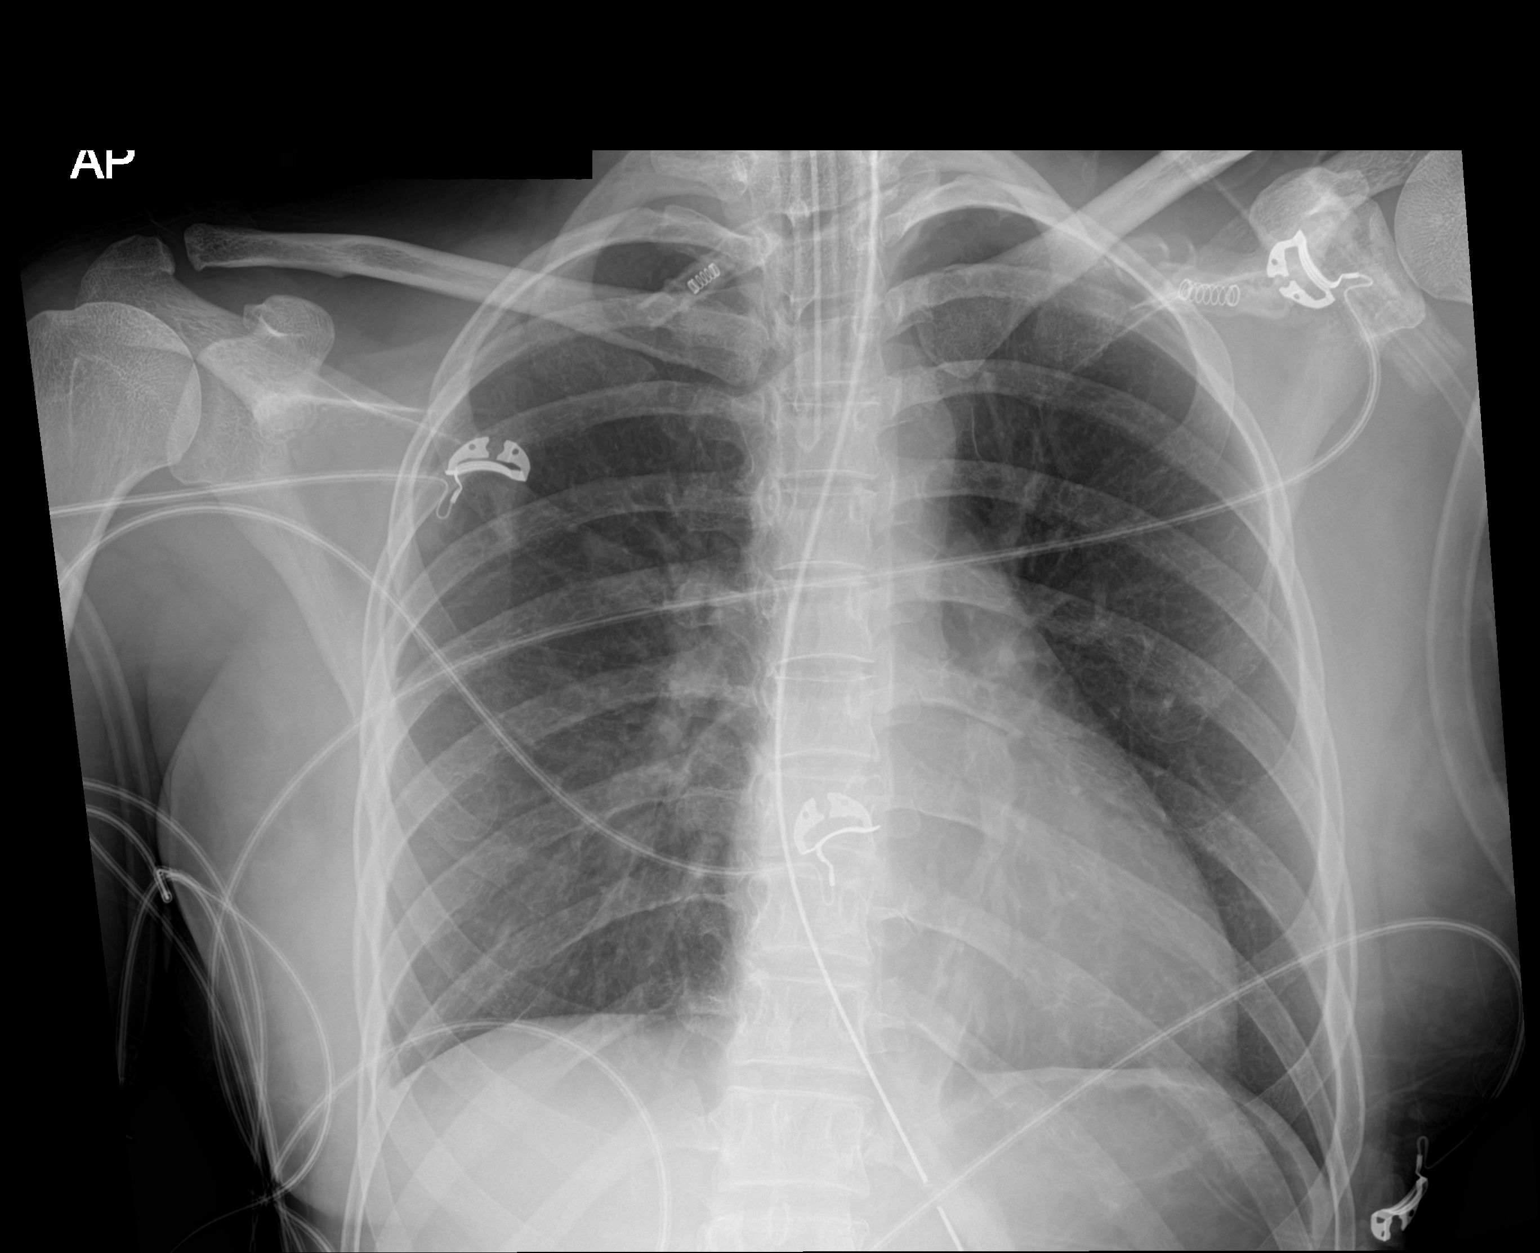

[1 of 1 positions shown; findings below may reference images not displayed]

FINDINGS: The cardiomediastinal silhouette is normal in contour. ETT tip
terminates 2.5 cm above the carina. Enteric tube side port projects
over the proximal stomach. No pleural effusion. No pneumothorax. No
acute pleuroparenchymal abnormality. Visualized abdomen is
unremarkable. No acute osseous abnormality noted.
IMPRESSION: Support apparatus as described above.

## 2020-07-05 IMAGING — MR MR HEAD WO/W CM
13 of 15 series · 42 of 48 positions shown · IV contrast (gadavist)
Comparison: MRI head [DATE]

CLINICAL DATA: Brain tumor.  Resection today.

EXAM:
MRI HEAD WITHOUT AND WITH CONTRAST
TECHNIQUE: Multiplanar, multiecho pulse sequences of the brain and surrounding
structures were obtained without and with intravenous contrast.
CONTRAST:  5mL GADAVIST GADOBUTROL 1 MMOL/ML IV SOLN

[Series 5: DWI · axial · 3.0mm · 0.88mm/px · z∈[-100,+40]mm · 8 of 96 slices shown (1 of 4)]
[im 1/96]
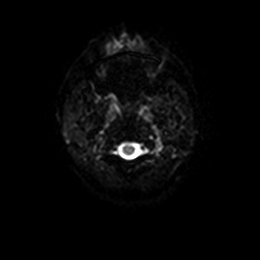
[im 14/96]
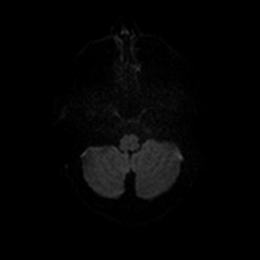
[im 28/96]
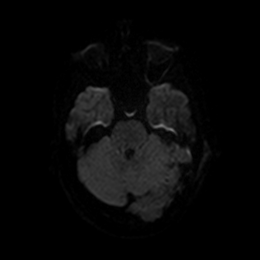
[im 41/96]
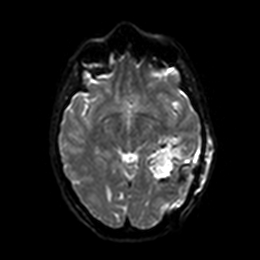
[im 55/96]
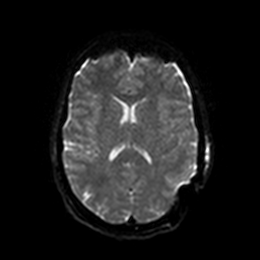
[im 68/96]
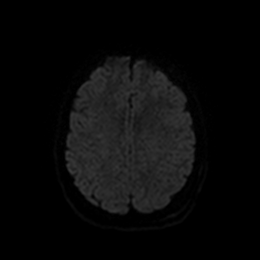
[im 82/96]
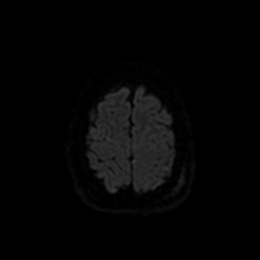
[im 96/96]
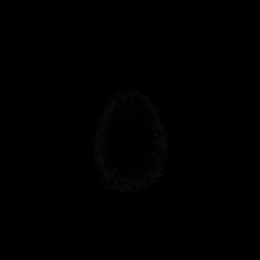

[Series 6: DWI · axial · 3.0mm · 0.88mm/px · z∈[-100,+40]mm · 4 of 48 slices shown (2 of 4)]
[im 1/48]
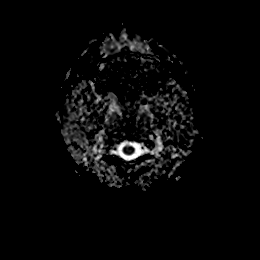
[im 16/48]
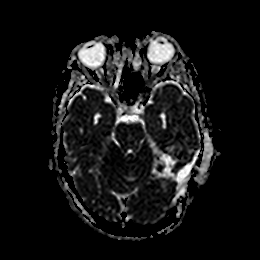
[im 32/48]
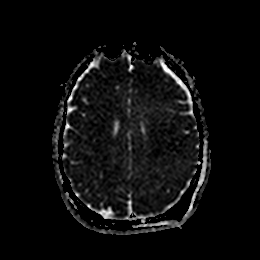
[im 48/48]
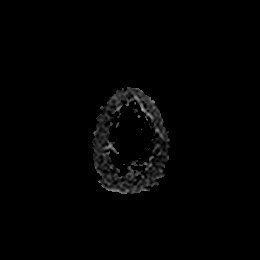

[Series 7: DWI · coronal · 4.0mm · 0.88mm/px · 5 of 72 slices shown (3 of 4)]
[im 1/72]
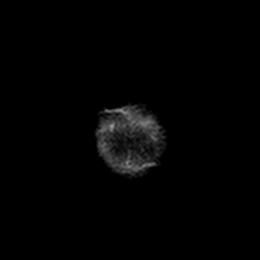
[im 18/72]
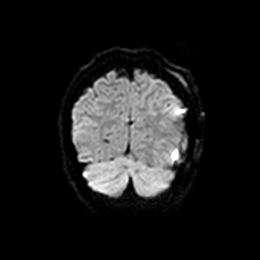
[im 36/72]
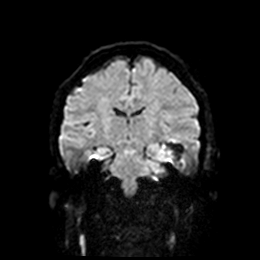
[im 54/72]
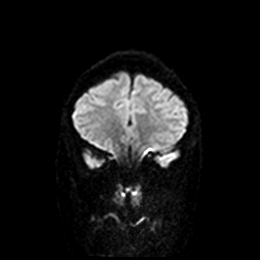
[im 72/72]
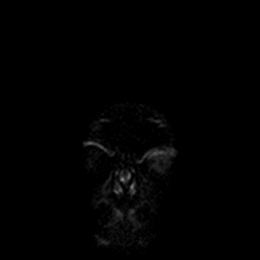

[Series 8: DWI · coronal · 4.0mm · 0.88mm/px · 3 of 36 slices shown (4 of 4)]
[im 1/36]
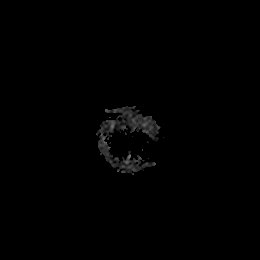
[im 18/36]
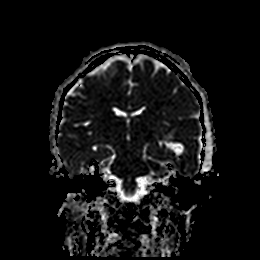
[im 36/36]
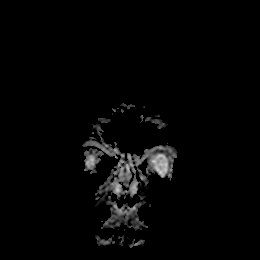

[Series 9: T1 · sagittal · 5.0mm · 0.94mm/px · 2 of 23 slices shown]
[im 1/23]
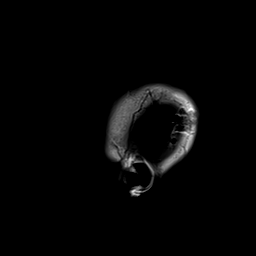
[im 23/23]
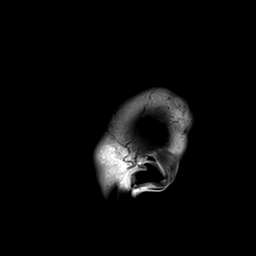

[Series 10: T2 · axial · 5.0mm · 0.90mm/px · z∈[-101,+43]mm · 2 of 25 slices shown]
[im 1/25]
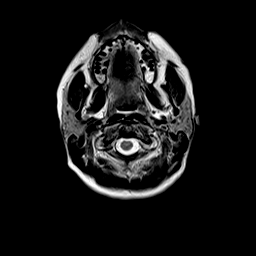
[im 25/25]
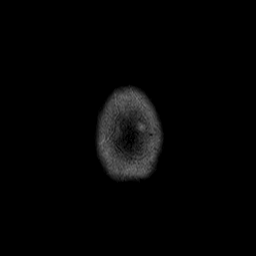

[Series 11: FLAIR · axial · 5.0mm · 0.45mm/px · z∈[-103,+41]mm · 2 of 25 slices shown]
[im 1/25]
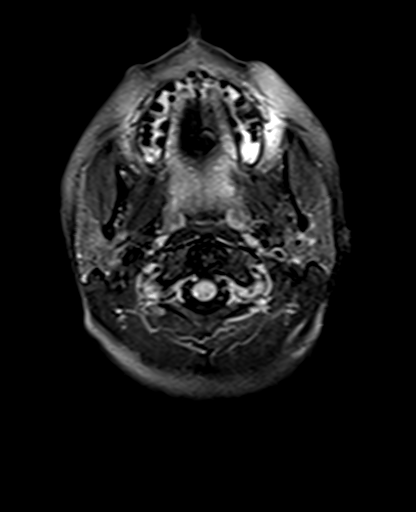
[im 25/25]
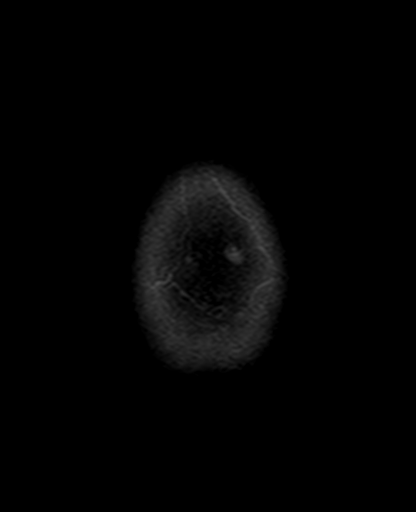

[Series 12: mag_images · axial · 3.0mm · 0.90mm/px · z∈[-95,+45]mm · 3 of 48 slices shown]
[im 1/48]
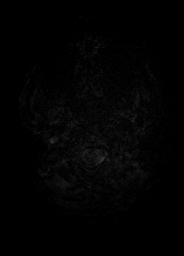
[im 24/48]
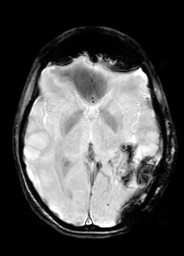
[im 48/48]
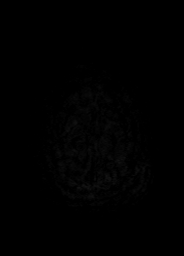

[Series 13: pha_images · axial · 3.0mm · 0.90mm/px · z∈[-92,+42]mm · 3 of 46 slices shown]
[im 1/46]
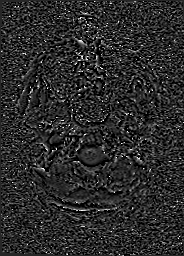
[im 23/46]
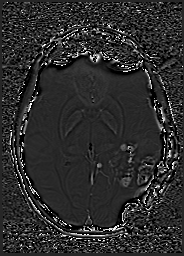
[im 46/46]
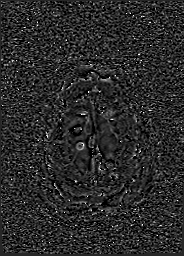

[Series 14: swi_images · axial · 3.0mm · 0.90mm/px · z∈[-95,+45]mm · 3 of 48 slices shown]
[im 1/48]
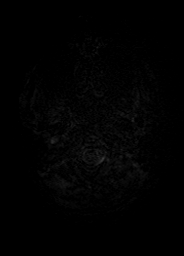
[im 24/48]
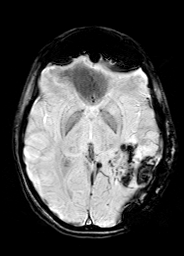
[im 48/48]
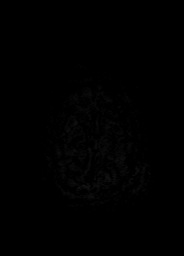

[Series 15: mip_images(sw) · axial · 24.0mm · 0.90mm/px · z∈[-85,+34]mm · 3 of 41 slices shown]
[im 1/41]
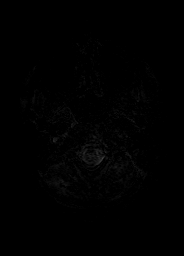
[im 21/41]
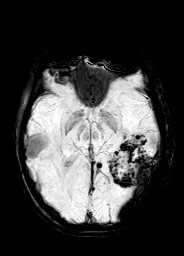
[im 41/41]
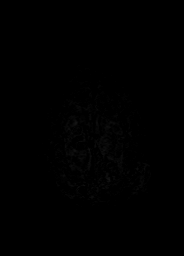

[Series 17: T2 post-contrast · coronal · 5.0mm · 0.90mm/px · 2 of 30 slices shown]
[im 1/30]
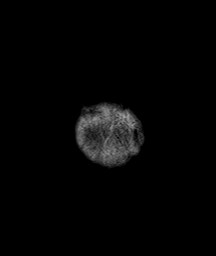
[im 30/30]
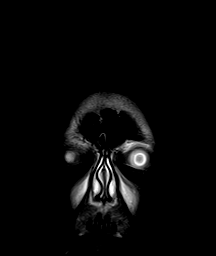

[Series 19: T1 post-contrast · coronal · 5.0mm · 0.43mm/px · 2 of 30 slices shown]
[im 1/30]
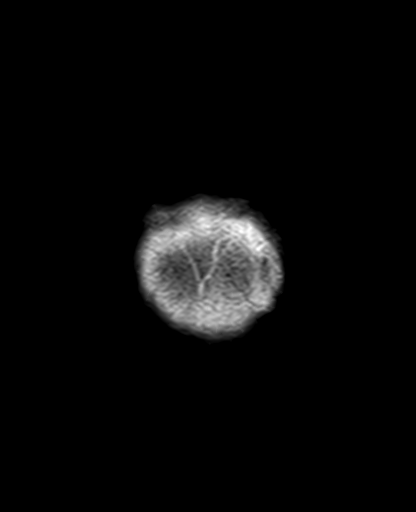
[im 30/30]
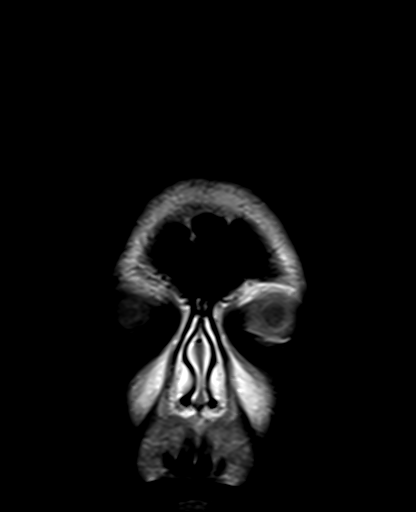

[42 of 48 positions shown; findings below may reference images not displayed]

FINDINGS: Brain: Left parietal craniotomy for tumor resection. Resection
cavity containing fluid and blood products. Prior to contrast
infusion, there is T1 hyperintensity along the margins of the tumor
most consistent with methemoglobin which is somewhat unusual given
the recent surgery earlier today. No enhancing tumor is identified.
The tumor showed intense enhancement prior to resection. No
significant acute perioperative infarct identified.

There are bilateral subdural effusions present. Extra-axial fluid is
present at the surgical craniotomy site. This measures approximately
5 mm. There is a mild amount of subarachnoid gas over the convexity
and in the frontal lobes bilaterally. No other mass lesion
identified. Ventricle size normal.

Vascular: Normal arterial flow voids.

Skull and upper cervical spine: Left parietal craniotomy.

Sinuses/Orbits: Mild mucosal edema paranasal sinuses. Mastoid clear
bilaterally. Normal orbit

Other: None
IMPRESSION: Apparent total resection of left parietal mass lesion. No residual
enhancing mass identified.

Ventricle size normal. No midline shift. Small subdural hygroma
bilaterally with pneumocephalus.

## 2020-07-05 SURGERY — CRANIOTOMY TUMOR EXCISION
Anesthesia: General | Site: Head

## 2020-07-05 MED ORDER — LEVETIRACETAM IN NACL 500 MG/100ML IV SOLN
500.0000 mg | Freq: Two times a day (BID) | INTRAVENOUS | Status: DC
  Administered 2020-07-05 – 2020-07-06 (×2): 500 mg via INTRAVENOUS
  Filled 2020-07-05 (×2): qty 100

## 2020-07-05 MED ORDER — MORPHINE SULFATE (PF) 2 MG/ML IV SOLN: 1.0000 mg | INTRAVENOUS | Status: DC | PRN

## 2020-07-05 MED ORDER — FENTANYL CITRATE (PF) 100 MCG/2ML IJ SOLN
INTRAMUSCULAR | Status: AC
  Filled 2020-07-05: qty 2

## 2020-07-05 MED ORDER — FLUOXETINE HCL 20 MG PO CAPS: 20.0000 mg | ORAL_CAPSULE | Freq: Every day | ORAL | Status: DC

## 2020-07-05 MED ORDER — OXCARBAZEPINE 150 MG PO TABS
150.0000 mg | ORAL_TABLET | Freq: Two times a day (BID) | ORAL | Status: DC
  Administered 2020-07-05 (×2): 150 mg
  Filled 2020-07-05 (×4): qty 1

## 2020-07-05 MED ORDER — DEXAMETHASONE SODIUM PHOSPHATE 10 MG/ML IJ SOLN
INTRAMUSCULAR | Status: AC
  Filled 2020-07-05: qty 1

## 2020-07-05 MED ORDER — ORAL CARE MOUTH RINSE: 15.0000 mL | Freq: Once | OROMUCOSAL | Status: DC

## 2020-07-05 MED ORDER — POTASSIUM CHLORIDE IN NACL 20-0.9 MEQ/L-% IV SOLN
INTRAVENOUS | Status: DC
  Filled 2020-07-05 (×2): qty 1000

## 2020-07-05 MED ORDER — ONDANSETRON HCL 4 MG/2ML IJ SOLN
INTRAMUSCULAR | Status: DC | PRN
  Administered 2020-07-05: 4 mg via INTRAVENOUS

## 2020-07-05 MED ORDER — PROPOFOL 1000 MG/100ML IV EMUL
5.0000 ug/kg/min | INTRAVENOUS | Status: DC
  Administered 2020-07-05: 10 ug/kg/min via INTRAVENOUS
  Filled 2020-07-05: qty 100

## 2020-07-05 MED ORDER — FENTANYL BOLUS VIA INFUSION
50.0000 ug | INTRAVENOUS | Status: DC | PRN
  Filled 2020-07-05: qty 50

## 2020-07-05 MED ORDER — OXCARBAZEPINE 150 MG PO TABS
150.0000 mg | ORAL_TABLET | Freq: Two times a day (BID) | ORAL | Status: DC
  Filled 2020-07-05 (×2): qty 1

## 2020-07-05 MED ORDER — THROMBIN 5000 UNITS EX SOLR
CUTANEOUS | Status: AC
  Filled 2020-07-05: qty 5000

## 2020-07-05 MED ORDER — MIDAZOLAM HCL 2 MG/2ML IJ SOLN
INTRAMUSCULAR | Status: AC
  Administered 2020-07-05: 2 mg via INTRAVENOUS
  Filled 2020-07-05: qty 2

## 2020-07-05 MED ORDER — PROPOFOL 10 MG/ML IV BOLUS
INTRAVENOUS | Status: DC | PRN
  Administered 2020-07-05 (×2): 50 mg via INTRAVENOUS
  Administered 2020-07-05: 80 mg via INTRAVENOUS

## 2020-07-05 MED ORDER — ONDANSETRON HCL 4 MG/2ML IJ SOLN: 4.0000 mg | INTRAMUSCULAR | Status: DC | PRN

## 2020-07-05 MED ORDER — CHLORHEXIDINE GLUCONATE 0.12 % MT SOLN
15.0000 mL | Freq: Once | OROMUCOSAL | Status: DC
  Filled 2020-07-05: qty 15

## 2020-07-05 MED ORDER — ONDANSETRON HCL 4 MG PO TABS: 4.0000 mg | ORAL_TABLET | ORAL | Status: DC | PRN

## 2020-07-05 MED ORDER — MIDAZOLAM HCL 2 MG/2ML IJ SOLN: 2.0000 mg | Freq: Once | INTRAMUSCULAR | Status: AC

## 2020-07-05 MED ORDER — THROMBIN 20000 UNITS EX SOLR
CUTANEOUS | Status: DC | PRN
  Administered 2020-07-05: 20 mL

## 2020-07-05 MED ORDER — CLONAZEPAM 0.5 MG PO TABS
0.5000 mg | ORAL_TABLET | Freq: Two times a day (BID) | ORAL | Status: DC | PRN
  Administered 2020-07-06: 0.5 mg via ORAL
  Filled 2020-07-05: qty 1

## 2020-07-05 MED ORDER — MIDAZOLAM HCL 2 MG/2ML IJ SOLN
INTRAMUSCULAR | Status: AC
  Filled 2020-07-05: qty 2

## 2020-07-05 MED ORDER — FENTANYL CITRATE (PF) 100 MCG/2ML IJ SOLN
100.0000 ug | Freq: Once | INTRAMUSCULAR | Status: AC
  Administered 2020-07-05: 100 ug via INTRAVENOUS

## 2020-07-05 MED ORDER — ORAL CARE MOUTH RINSE
15.0000 mL | OROMUCOSAL | Status: DC
  Administered 2020-07-05 – 2020-07-06 (×5): 15 mL via OROMUCOSAL

## 2020-07-05 MED ORDER — HEMOSTATIC AGENTS (NO CHARGE) OPTIME
TOPICAL | Status: DC | PRN
  Administered 2020-07-05 (×2): 1

## 2020-07-05 MED ORDER — FENTANYL CITRATE (PF) 250 MCG/5ML IJ SOLN
INTRAMUSCULAR | Status: AC
  Filled 2020-07-05: qty 5

## 2020-07-05 MED ORDER — GADOBUTROL 1 MMOL/ML IV SOLN
5.0000 mL | Freq: Once | INTRAVENOUS | Status: AC | PRN
  Administered 2020-07-05: 5 mL via INTRAVENOUS

## 2020-07-05 MED ORDER — THROMBIN 20000 UNITS EX SOLR
CUTANEOUS | Status: AC
  Filled 2020-07-05: qty 20000

## 2020-07-05 MED ORDER — CEFAZOLIN SODIUM-DEXTROSE 1-4 GM/50ML-% IV SOLN
1.0000 g | Freq: Three times a day (TID) | INTRAVENOUS | Status: DC
  Administered 2020-07-05 – 2020-07-06 (×2): 1 g via INTRAVENOUS
  Filled 2020-07-05 (×3): qty 50

## 2020-07-05 MED ORDER — DEXMEDETOMIDINE HCL IN NACL 400 MCG/100ML IV SOLN
0.4000 ug/kg/h | INTRAVENOUS | Status: DC
  Administered 2020-07-05: 0.8 ug/kg/h via INTRAVENOUS
  Filled 2020-07-05: qty 100

## 2020-07-05 MED ORDER — DEXAMETHASONE SODIUM PHOSPHATE 4 MG/ML IJ SOLN
4.0000 mg | Freq: Four times a day (QID) | INTRAMUSCULAR | Status: DC
  Administered 2020-07-05 – 2020-07-06 (×2): 4 mg via INTRAVENOUS
  Filled 2020-07-05 (×2): qty 1

## 2020-07-05 MED ORDER — FENTANYL CITRATE (PF) 100 MCG/2ML IJ SOLN
INTRAMUSCULAR | Status: DC | PRN
  Administered 2020-07-05: 100 ug via INTRAVENOUS
  Administered 2020-07-05 (×3): 50 ug via INTRAVENOUS

## 2020-07-05 MED ORDER — HYDROCODONE-ACETAMINOPHEN 5-325 MG PO TABS: 1.0000 | ORAL_TABLET | ORAL | Status: DC | PRN

## 2020-07-05 MED ORDER — CHLORHEXIDINE GLUCONATE CLOTH 2 % EX PADS
6.0000 | MEDICATED_PAD | Freq: Every day | CUTANEOUS | Status: DC
  Administered 2020-07-05: 6 via TOPICAL

## 2020-07-05 MED ORDER — ENALAPRILAT 1.25 MG/ML IV SOLN: 1.2500 mg | Freq: Four times a day (QID) | INTRAVENOUS | Status: DC | PRN

## 2020-07-05 MED ORDER — DOCUSATE SODIUM 100 MG PO CAPS: 100.0000 mg | ORAL_CAPSULE | Freq: Two times a day (BID) | ORAL | Status: DC

## 2020-07-05 MED ORDER — SODIUM CHLORIDE 0.9 % IV SOLN: INTRAVENOUS | Status: DC | PRN

## 2020-07-05 MED ORDER — LIDOCAINE-EPINEPHRINE 1 %-1:100000 IJ SOLN
INTRAMUSCULAR | Status: AC
  Filled 2020-07-05: qty 1

## 2020-07-05 MED ORDER — NICARDIPINE HCL IN NACL 20-0.86 MG/200ML-% IV SOLN: 3.0000 mg/h | INTRAVENOUS | Status: DC

## 2020-07-05 MED ORDER — MIDAZOLAM HCL 5 MG/5ML IJ SOLN
INTRAMUSCULAR | Status: DC | PRN
  Administered 2020-07-05 (×2): 2 mg via INTRAVENOUS

## 2020-07-05 MED ORDER — DOCUSATE SODIUM 50 MG/5ML PO LIQD: 100.0000 mg | Freq: Two times a day (BID) | ORAL | Status: DC

## 2020-07-05 MED ORDER — FENTANYL CITRATE (PF) 100 MCG/2ML IJ SOLN: 50.0000 ug | Freq: Once | INTRAMUSCULAR | Status: DC

## 2020-07-05 MED ORDER — ARTIFICIAL TEARS OPHTHALMIC OINT
TOPICAL_OINTMENT | OPHTHALMIC | Status: AC
  Filled 2020-07-05: qty 3.5

## 2020-07-05 MED ORDER — THROMBIN 5000 UNITS EX SOLR
OROMUCOSAL | Status: DC | PRN
  Administered 2020-07-05 (×2): 5 mL

## 2020-07-05 MED ORDER — CHLORHEXIDINE GLUCONATE 0.12% ORAL RINSE (MEDLINE KIT)
15.0000 mL | Freq: Two times a day (BID) | OROMUCOSAL | Status: DC
  Administered 2020-07-05: 15 mL via OROMUCOSAL

## 2020-07-05 MED ORDER — CEFAZOLIN SODIUM-DEXTROSE 2-4 GM/100ML-% IV SOLN
INTRAVENOUS | Status: AC
  Filled 2020-07-05: qty 100

## 2020-07-05 MED ORDER — LIDOCAINE-EPINEPHRINE 1 %-1:100000 IJ SOLN
INTRAMUSCULAR | Status: DC | PRN
  Administered 2020-07-05: 10 mL

## 2020-07-05 MED ORDER — CEFAZOLIN SODIUM-DEXTROSE 2-4 GM/100ML-% IV SOLN
2.0000 g | Freq: Once | INTRAVENOUS | Status: AC
  Administered 2020-07-05 (×2): 2 g via INTRAVENOUS

## 2020-07-05 MED ORDER — ROCURONIUM BROMIDE 10 MG/ML (PF) SYRINGE
PREFILLED_SYRINGE | INTRAVENOUS | Status: DC | PRN
  Administered 2020-07-05 (×4): 50 mg via INTRAVENOUS

## 2020-07-05 MED ORDER — POLYETHYLENE GLYCOL 3350 17 G PO PACK: 17.0000 g | PACK | Freq: Every day | ORAL | Status: DC | PRN

## 2020-07-05 MED ORDER — ONDANSETRON HCL 4 MG/2ML IJ SOLN
INTRAMUSCULAR | Status: AC
  Filled 2020-07-05: qty 2

## 2020-07-05 MED ORDER — ACETAMINOPHEN 650 MG RE SUPP: 650.0000 mg | RECTAL | Status: DC | PRN

## 2020-07-05 MED ORDER — ROCURONIUM BROMIDE 10 MG/ML (PF) SYRINGE
PREFILLED_SYRINGE | INTRAVENOUS | Status: AC
  Filled 2020-07-05: qty 20

## 2020-07-05 MED ORDER — ENOXAPARIN SODIUM 40 MG/0.4ML ~~LOC~~ SOLN: 40.0000 mg | SUBCUTANEOUS | Status: DC

## 2020-07-05 MED ORDER — ROCURONIUM BROMIDE 10 MG/ML (PF) SYRINGE
PREFILLED_SYRINGE | INTRAVENOUS | Status: AC
  Filled 2020-07-05: qty 10

## 2020-07-05 MED ORDER — BACITRACIN ZINC 500 UNIT/GM EX OINT
TOPICAL_OINTMENT | CUTANEOUS | Status: DC | PRN
  Administered 2020-07-05: 1 via TOPICAL

## 2020-07-05 MED ORDER — PROPOFOL 10 MG/ML IV BOLUS
INTRAVENOUS | Status: AC
  Filled 2020-07-05: qty 40

## 2020-07-05 MED ORDER — PROMETHAZINE HCL 12.5 MG PO TABS
12.5000 mg | ORAL_TABLET | ORAL | Status: DC | PRN
  Filled 2020-07-05: qty 2

## 2020-07-05 MED ORDER — PHENYLEPHRINE HCL-NACL 10-0.9 MG/250ML-% IV SOLN
INTRAVENOUS | Status: DC | PRN
  Administered 2020-07-05: 20 ug/min via INTRAVENOUS

## 2020-07-05 MED ORDER — PANTOPRAZOLE SODIUM 40 MG IV SOLR
40.0000 mg | Freq: Every day | INTRAVENOUS | Status: DC
  Administered 2020-07-05: 40 mg via INTRAVENOUS
  Filled 2020-07-05: qty 40

## 2020-07-05 MED ORDER — DEXAMETHASONE SODIUM PHOSPHATE 10 MG/ML IJ SOLN
INTRAMUSCULAR | Status: DC | PRN
  Administered 2020-07-05: 10 mg via INTRAVENOUS

## 2020-07-05 MED ORDER — DEXMEDETOMIDINE HCL IN NACL 400 MCG/100ML IV SOLN
INTRAVENOUS | Status: DC | PRN
  Administered 2020-07-05: 1 ug/kg/h via INTRAVENOUS

## 2020-07-05 MED ORDER — ACETAMINOPHEN 325 MG PO TABS: 650.0000 mg | ORAL_TABLET | ORAL | Status: DC | PRN

## 2020-07-05 MED ORDER — FENTANYL 2500MCG IN NS 250ML (10MCG/ML) PREMIX INFUSION
50.0000 ug/h | INTRAVENOUS | Status: DC
  Administered 2020-07-05: 100 ug/h via INTRAVENOUS
  Administered 2020-07-05: 200 ug/h via INTRAVENOUS
  Filled 2020-07-05 (×2): qty 250

## 2020-07-05 MED ORDER — MANNITOL 25 % IV SOLN
INTRAVENOUS | Status: DC | PRN
  Administered 2020-07-05: 25 g via INTRAVENOUS

## 2020-07-05 MED ORDER — PROPOFOL 1000 MG/100ML IV EMUL
INTRAVENOUS | Status: AC
  Administered 2020-07-06: 50 ug/kg/min via INTRAVENOUS
  Filled 2020-07-05: qty 100

## 2020-07-05 MED ORDER — SODIUM CHLORIDE 0.9 % IV SOLN: INTRAVENOUS | Status: DC

## 2020-07-05 MED ORDER — LABETALOL HCL 5 MG/ML IV SOLN: 10.0000 mg | INTRAVENOUS | Status: DC | PRN

## 2020-07-05 MED ORDER — 0.9 % SODIUM CHLORIDE (POUR BTL) OPTIME
TOPICAL | Status: DC | PRN
  Administered 2020-07-05: 1000 mL

## 2020-07-05 MED ORDER — FLEET ENEMA 7-19 GM/118ML RE ENEM: 1.0000 | ENEMA | Freq: Once | RECTAL | Status: DC | PRN

## 2020-07-05 MED ORDER — LEVETIRACETAM IN NACL 1000 MG/100ML IV SOLN
1000.0000 mg | INTRAVENOUS | Status: AC
  Administered 2020-07-05: 1000 mg via INTRAVENOUS
  Filled 2020-07-05: qty 100

## 2020-07-05 MED ORDER — ROCURONIUM BROMIDE 50 MG/5ML IV SOLN
50.0000 mg | Freq: Once | INTRAVENOUS | Status: DC
  Filled 2020-07-05: qty 5

## 2020-07-05 MED ORDER — BACITRACIN ZINC 500 UNIT/GM EX OINT
TOPICAL_OINTMENT | CUTANEOUS | Status: AC
  Filled 2020-07-05: qty 28.35

## 2020-07-05 SURGICAL SUPPLY — 98 items
ADH SKN CLS APL DERMABOND .7 (GAUZE/BANDAGES/DRESSINGS) ×2
APL SKNCLS STERI-STRIP NONHPOA (GAUZE/BANDAGES/DRESSINGS)
BAND INSRT 18 STRL LF DISP RB (MISCELLANEOUS) ×4
BAND RUBBER #18 3X1/16 STRL (MISCELLANEOUS) ×2 IMPLANT
BENZOIN TINCTURE PRP APPL 2/3 (GAUZE/BANDAGES/DRESSINGS) IMPLANT
BLADE CLIPPER SURG (BLADE) ×3 IMPLANT
BLADE SAW GIGLI 16 STRL (MISCELLANEOUS) IMPLANT
BLADE SURG 11 STRL SS (BLADE) ×3 IMPLANT
BLADE SURG 15 STRL LF DISP TIS (BLADE) IMPLANT
BLADE SURG 15 STRL SS (BLADE)
BNDG CMPR 75X41 PLY HI ABS (GAUZE/BANDAGES/DRESSINGS)
BNDG GAUZE ELAST 4 BULKY (GAUZE/BANDAGES/DRESSINGS) IMPLANT
BNDG STRETCH 4X75 STRL LF (GAUZE/BANDAGES/DRESSINGS) IMPLANT
BUR ACORN 9.0 PRECISION (BURR) ×3 IMPLANT
BUR ROUND FLUTED 4 SOFT TCH (BURR) IMPLANT
BUR SPIRAL ROUTER 2.3 (BUR) ×3 IMPLANT
CANISTER SUCT 3000ML PPV (MISCELLANEOUS) ×6 IMPLANT
CATH FOLEY 16FR TEMP PROBE (CATHETERS) ×1 IMPLANT
CATH VENTRIC 35X38 W/TROCAR LG (CATHETERS) IMPLANT
CLIP VESOCCLUDE MED 6/CT (CLIP) IMPLANT
CNTNR URN SCR LID CUP LEK RST (MISCELLANEOUS) ×2 IMPLANT
CONT SPEC 4OZ STRL OR WHT (MISCELLANEOUS) ×3
COVER BURR HOLE UNIV 10 (Orthopedic Implant) ×3 IMPLANT
COVER MAYO STAND STRL (DRAPES) IMPLANT
COVER WAND RF STERILE (DRAPES) ×2 IMPLANT
DECANTER SPIKE VIAL GLASS SM (MISCELLANEOUS) ×2 IMPLANT
DERMABOND ADVANCED (GAUZE/BANDAGES/DRESSINGS) ×1
DERMABOND ADVANCED .7 DNX12 (GAUZE/BANDAGES/DRESSINGS) IMPLANT
DRAIN SUBARACHNOID (WOUND CARE) IMPLANT
DRAPE HALF SHEET 40X57 (DRAPES) ×3 IMPLANT
DRAPE MICROSCOPE LEICA (MISCELLANEOUS) ×1 IMPLANT
DRAPE NEUROLOGICAL W/INCISE (DRAPES) ×3 IMPLANT
DRAPE STERI IOBAN 125X83 (DRAPES) IMPLANT
DRAPE SURG 17X23 STRL (DRAPES) IMPLANT
DRAPE WARM FLUID 44X44 (DRAPES) ×3 IMPLANT
DRSG ADAPTIC 3X8 NADH LF (GAUZE/BANDAGES/DRESSINGS) IMPLANT
DRSG TELFA 3X8 NADH (GAUZE/BANDAGES/DRESSINGS) IMPLANT
DURAPREP 6ML APPLICATOR 50/CS (WOUND CARE) ×3 IMPLANT
ELECT COATED BLADE 2.86 ST (ELECTRODE) ×3 IMPLANT
ELECT REM PT RETURN 9FT ADLT (ELECTROSURGICAL) ×3
ELECTRODE REM PT RTRN 9FT ADLT (ELECTROSURGICAL) ×2 IMPLANT
EVACUATOR 1/8 PVC DRAIN (DRAIN) IMPLANT
EVACUATOR SILICONE 100CC (DRAIN) IMPLANT
FORCEPS BIPOLAR SPETZLER 8 1.0 (NEUROSURGERY SUPPLIES) ×3 IMPLANT
GAUZE 4X4 16PLY RFD (DISPOSABLE) IMPLANT
GAUZE SPONGE 4X4 12PLY STRL (GAUZE/BANDAGES/DRESSINGS) IMPLANT
GLOVE BIO SURGEON STRL SZ7.5 (GLOVE) ×3 IMPLANT
GLOVE BIOGEL PI IND STRL 7.5 (GLOVE) ×2 IMPLANT
GLOVE BIOGEL PI INDICATOR 7.5 (GLOVE) ×1
GLOVE ECLIPSE 7.5 STRL STRAW (GLOVE) IMPLANT
GOWN STRL REUS W/ TWL LRG LVL3 (GOWN DISPOSABLE) ×4 IMPLANT
GOWN STRL REUS W/ TWL XL LVL3 (GOWN DISPOSABLE) IMPLANT
GOWN STRL REUS W/TWL 2XL LVL3 (GOWN DISPOSABLE) IMPLANT
GOWN STRL REUS W/TWL LRG LVL3 (GOWN DISPOSABLE) ×3
GOWN STRL REUS W/TWL XL LVL3 (GOWN DISPOSABLE) ×12
HEMOSTAT POWDER KIT SURGIFOAM (HEMOSTASIS) ×4 IMPLANT
HEMOSTAT SURGICEL 2X14 (HEMOSTASIS) ×3 IMPLANT
HOOK DURA 1/2IN (MISCELLANEOUS) ×5 IMPLANT
IV NS 1000ML (IV SOLUTION) ×3
IV NS 1000ML BAXH (IV SOLUTION) ×2 IMPLANT
KIT BASIN OR (CUSTOM PROCEDURE TRAY) ×3 IMPLANT
KIT DRAIN CSF ACCUDRAIN (MISCELLANEOUS) IMPLANT
KIT NDL BIOPSY 1.8X235 CRAN (NEEDLE) ×2 IMPLANT
KIT NEEDLE BIOPSY 1.8X235 CRAN (NEEDLE) IMPLANT
KIT TURNOVER KIT B (KITS) ×3 IMPLANT
KIT VARIOGUIDE DRILL 1.9 (KITS) ×3 IMPLANT
MARKER SPHERE PSV REFLC 13MM (MARKER) ×6 IMPLANT
NDL SPNL 18GX3.5 QUINCKE PK (NEEDLE) IMPLANT
NEEDLE HYPO 22GX1.5 SAFETY (NEEDLE) ×3 IMPLANT
NEEDLE SPNL 18GX3.5 QUINCKE PK (NEEDLE) IMPLANT
NS IRRIG 1000ML POUR BTL (IV SOLUTION) ×9 IMPLANT
PACK BATTERY CMF DISP FOR DVR (ORTHOPEDIC DISPOSABLE SUPPLIES) ×1 IMPLANT
PACK CRANIOTOMY CUSTOM (CUSTOM PROCEDURE TRAY) ×3 IMPLANT
PAD DRESSING TELFA 3X8 NADH (GAUZE/BANDAGES/DRESSINGS) IMPLANT
PATTIES SURGICAL .25X.25 (GAUZE/BANDAGES/DRESSINGS) IMPLANT
PATTIES SURGICAL .5 X.5 (GAUZE/BANDAGES/DRESSINGS) IMPLANT
PATTIES SURGICAL .5 X3 (DISPOSABLE) ×1 IMPLANT
PATTIES SURGICAL .5X1.5 (GAUZE/BANDAGES/DRESSINGS) ×1 IMPLANT
PATTIES SURGICAL 1/4 X 3 (GAUZE/BANDAGES/DRESSINGS) ×1 IMPLANT
PATTIES SURGICAL 1X1 (DISPOSABLE) IMPLANT
PIN MAYFIELD SKULL DISP (PIN) ×3 IMPLANT
SCREW UNIII AXS SD 1.5X4 (Screw) ×12 IMPLANT
SEALANT ADHERUS EXTEND TIP (MISCELLANEOUS) ×1 IMPLANT
SPONGE NEURO XRAY DETECT 1X3 (DISPOSABLE) IMPLANT
SPONGE SURGIFOAM ABS GEL 100 (HEMOSTASIS) ×3 IMPLANT
STAPLER VISISTAT 35W (STAPLE) ×3 IMPLANT
SUT ETHILON 3 0 FSL (SUTURE) IMPLANT
SUT ETHILON 3 0 PS 1 (SUTURE) IMPLANT
SUT MNCRL AB 3-0 PS2 18 (SUTURE) IMPLANT
SUT NURALON 4 0 TR CR/8 (SUTURE) ×7 IMPLANT
SUT SILK 0 TIES 10X30 (SUTURE) IMPLANT
SUT VIC AB 2-0 CP2 18 (SUTURE) ×4 IMPLANT
SUT VICRYL RAPIDE 3 0 (SUTURE) ×2 IMPLANT
TOWEL GREEN STERILE (TOWEL DISPOSABLE) ×3 IMPLANT
TOWEL GREEN STERILE FF (TOWEL DISPOSABLE) ×3 IMPLANT
TUBE CONNECTING 12X1/4 (SUCTIONS) ×3 IMPLANT
UNDERPAD 30X36 HEAVY ABSORB (UNDERPADS AND DIAPERS) ×3 IMPLANT
WATER STERILE IRR 1000ML POUR (IV SOLUTION) ×3 IMPLANT

## 2020-07-05 NOTE — Transfer of Care (Signed)
Immediate Anesthesia Transfer of Care Note  Patient: Sonya Prince  Procedure(s) Performed: CRANIOTOMY LEFT TEMPORAL FOR TUMOR RESECTION (Left Head) APPLICATION OF CRANIAL NAVIGATION (N/A Head) OPERATIVE ULTRASOUND (Left Head)  Patient Location: ICU  Anesthesia Type:General  Level of Consciousness: sedated and Patient remains intubated per anesthesia plan  Airway & Oxygen Therapy: Patient remains intubated per anesthesia plan and Patient placed on Ventilator (see vital sign flow sheet for setting)  Post-op Assessment: Report given to RN and Post -op Vital signs reviewed and stable  Post vital signs: Reviewed and stable  Last Vitals:  Vitals Value Taken Time  BP    Temp    Pulse    Resp    SpO2      Last Pain:  Vitals:   07/05/20 0653  PainSc: 0-No pain         Complications: No complications documented.

## 2020-07-05 NOTE — Op Note (Signed)
Procedure(s): CRANIOTOMY LEFT TEMPORAL FOR TUMOR RESECTION APPLICATION OF CRANIAL NAVIGATION OPERATIVE ULTRASOUND Procedure Note  Sonya Prince female 29 y.o. 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  She previously had a CT scan of her head from several years ago which showed the mass to be significantly smaller.  Her case was discussed at interdisciplinary tumor board.  Given the significant growth in the size of the mass, craniotomy for resection was recommended for both diagnostic and therapeutic purposes.  After extensive discussion with patient and her parents, they wish to proceed with surgery.  Risks, benefits, alternatives, and expected convalescence were discussed.  Risks discussed included, but were not limited to bleeding, pain, infection, seizure, scar, stroke, neurologic deficit, recurrence, coma, and death.  Informed consent was obtained and signed by both the patient and her parents.     Surgeon: Vallarie Mare   Assistants: Newman Pies, MD  Anesthesia: General endotracheal anesthesia  Procedure Detail  CRANIOTOMY LEFT TEMPORAL FOR TUMOR RESECTION, APPLICATION OF CRANIAL NAVIGATION, OPERATIVE ULTRASOUND  Patient was brought to the operating room.  General anesthesia was induced and patient was intubated by the anesthesia service.  After appropriate lines and monitors were placed, patient was positioned supine head turned to the right with all pressure points padded and eyes protected.  Her head was placed in a [place] head holder and affixed to the bed.  Preoperative  thin cut MRI was reconstructed into a 3D image.  This was used to perform surface match registration with the [place] system.  This allowed for computer-assisted neuronavigation during the case.  A small strip of hair was shaved for a hair sparing incision which was planned using the navigation system.  The scalp was preprepped with alcohol and prepped and draped in sterile fashion.  1% lidocaine with epinephrine was directed in the skin.  A timeout was performed.  Preoperative antibiotics and dexamethasone were given.  Incision was made with a 10 blade and the periosteum was dissected from the skull.  Self-retaining retractor was used.  Bur holes were placed with a high-speed drill and used to dissect the dura from the inner table of the skull.  The superior aspect of the transverse sinus was exposed.  Craniotome was used to perform craniotomy.  Meticulous hemostasis was obtained.  The dura was opened in C-shaped manner and flapped downwards towards the sinus.  The vein of Labbe was identified on the temporal lobe surface.  The inferior temporal gyrus posterior to the vein of Labbe was coagulated and then cut with microscissors.  The tumor was seen only millimeters from this brain surface at this point.  The tumor was dissected from the surrounding white matter circumferentially.  The tumor was fairly distinct from the surrounding white matter, somewhat gray with light blue hue.  Small white matter feeders were coagulated.  There were feeders from the tentorial surface of the pia that were coagulated and cut sharply to devascularize the tumor.  The inferior surface of the tumor was apposed to the pia in some locations. The  tumor was fairly vascular, soft and irregularly shaped.  Circumferential dissection continued posterior and superior aspects of the tumor followed by the anterior aspect.  The deep section of the tumor was then dissected from the lateral wall of the temporal horn.  Small choroidal feeders were  coagulated and cut.  There were areas of ventricular wall involvement of the tumor, so approximately 2 to 3 cm of the lateral wall of the temporal horn was opened.  The ventricle was inspected and there was no infiltrative involvement hippocampus and the choroid looked normal.  In this manner, the tumor was removed removed essentially in one piece.  Specimen was sent for frozen section and returned as primary brain tumor, likely low-grade.  Ultrasound was then used to confirm good resection with no evidence of residual.  Meticulous hemostasis obtained in the cavity.  The cavity was lined with Surgicel.  The dura was then closed with 4 Nurolon in interrupted fashion followed by adherent dural spray to reinforce the stitch lines.  Meticulous epidural hemostasis obtained.  The bone flap replaced with Stryker cranial plating system.  The scalp was closed with 2-0 Vicryl stitches in buried infra fashion followed by 3-0 Vicryl repeat in running fashion and Dermabond.  Patient then removed from [place] head holder.  For the purposes of obtaining the appropriate postoperative imaging and studies to aid in appropriate and to aid in appropriate postoperative care, the patient was kept intubated and transferred to the ICU in stable condition.  In the ICU, she was following commands x4 with no focal deficits noted.  All counts were correct at the end of surgery.   findings: Primary brain tumor, successful resection  Estimated Blood Loss:  300 ml         Drains: none                Specimens: left temporal lobe tumor         Implants: Stryker cranial plating system        Complications:  * No complications entered in OR log *         Disposition: ICU - intubated and hemodynamically stable.         Condition: stable

## 2020-07-05 NOTE — Anesthesia Procedure Notes (Signed)
Arterial Line Insertion Start/End8/03/2020 8:38 AM Performed by: Josephine Igo, CRNA, CRNA  Patient location: OR. Preanesthetic checklist: patient identified, IV checked, site marked, risks and benefits discussed, surgical consent, monitors and equipment checked, pre-op evaluation, timeout performed and anesthesia consent Lidocaine 1% used for infiltration Left, radial was placed Catheter size: 20 G Hand hygiene performed  and maximum sterile barriers used   Attempts: 1 Procedure performed without using ultrasound guided technique. Following insertion, dressing applied and Biopatch. Post procedure assessment: normal and unchanged  Patient tolerated the procedure well with no immediate complications.

## 2020-07-05 NOTE — Anesthesia Postprocedure Evaluation (Signed)
Anesthesia Post Note  Patient: Sonya Prince  Procedure(s) Performed: CRANIOTOMY LEFT TEMPORAL FOR TUMOR RESECTION (Left Head) APPLICATION OF CRANIAL NAVIGATION (N/A Head) OPERATIVE ULTRASOUND (Left Head)     Patient location during evaluation: SICU Anesthesia Type: General Level of consciousness: sedated Pain management: pain level controlled Vital Signs Assessment: post-procedure vital signs reviewed and stable Respiratory status: patient remains intubated per anesthesia plan Cardiovascular status: stable Postop Assessment: no apparent nausea or vomiting Anesthetic complications: no   No complications documented.  Last Vitals:  Vitals:   07/05/20 1346 07/05/20 1500  BP:  101/71  Resp:  16  Temp:  (!) 36 C  SpO2: 100%     Last Pain:  Vitals:   07/05/20 0653  PainSc: 0-No pain                 Karielle Davidow,W. EDMOND

## 2020-07-05 NOTE — Anesthesia Procedure Notes (Signed)
Procedure Name: Intubation Date/Time: 07/05/2020 8:25 AM Performed by: Candis Shine, CRNA Pre-anesthesia Checklist: Patient identified, Emergency Drugs available, Suction available and Patient being monitored Patient Re-evaluated:Patient Re-evaluated prior to induction Oxygen Delivery Method: Circle System Utilized Preoxygenation: Pre-oxygenation with 100% oxygen Induction Type: Inhalational induction Ventilation: Mask ventilation without difficulty Laryngoscope Size: Mac and 3 Grade View: Grade II Tube type: Oral Tube size: 7.5 mm Number of attempts: 1 Airway Equipment and Method: Bougie stylet Placement Confirmation: ETT inserted through vocal cords under direct vision,  positive ETCO2 and breath sounds checked- equal and bilateral Secured at: 22 cm Tube secured with: Tape Dental Injury: Teeth and Oropharynx as per pre-operative assessment

## 2020-07-05 NOTE — Progress Notes (Signed)
Patient came back to the OR in her full clothes. After she fell asleep we removed her jacket, long sleeve shirt, tank top, pants, and underwear. All items were placed in a transparent plastic bag and a patient label was placed on the bag.

## 2020-07-05 NOTE — H&P (Signed)
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  She previously had a CT scan of her head from several years ago which showed the mass to be significantly smaller.  Her case was discussed at interdisciplinary tumor board.  Given the significant growth in the size of the mass, craniotomy for resection was recommended for both diagnostic and therapeutic purposes.  After extensive discussion with patient and her parents, they wish to proceed with surgery.  Risks, benefits, alternatives, and expected convalescence were discussed.  Risks discussed included, but were not limited to bleeding, pain, infection, seizure, scar, stroke, neurologic deficit, recurrence, coma, and death.  Informed consent was obtained and signed by both the patient and her parents.    Patient Active Problem List   Diagnosis Date Noted  . Brain tumor ([place]) 07/05/2020  . Brain mass 05/31/2020  . Focal seizures ([place]) 05/21/2020  . Vasogenic brain edema ([place]) 05/21/2020  . Nicotine dependence, cigarettes, uncomplicated 16/12/930  . Toxic metabolic encephalopathy 35/57/3220  . Bipolar affective disorder, currently manic, mild ([place]) 01/17/2019   Past Medical History:  Diagnosis Date  . Anxiety   . Bipolar disorder [facility])    Father denies diagnosis  . Mass of left temporal lobe   . Meningitis   . PTSD (post-traumatic stress disorder)   . Seizures ([place])     Past Surgical History:  Procedure Laterality Date  . OPEN REDUCTION INTERNAL FIXATION (ORIF) METACARPAL Right 12/19/2019   Procedure: OPEN TREATMENT OF RIGHT 5TH METACARPAL FRACTURE;  Surgeon: Milly Jakob, MD;  Location: [place];  Service: Orthopedics;  Laterality: Right;  WITH PREOP REGIONAL BLOCK  . WISDOM TOOTH EXTRACTION      Medications Prior to Admission  Medication Sig Dispense Refill Last Dose  .  amphetamine-dextroamphetamine (ADDERALL) 20 MG tablet Take 20 mg by mouth daily.      . clonazePAM (KLONOPIN) 0.5 MG tablet Take 0.5 mg by mouth as needed for anxiety.      . diazepam (VALIUM) 5 MG tablet Take 5 mg by mouth as directed. Per surgery scheduled     . FLUoxetine (PROZAC) 20 MG capsule Take 20 mg by mouth daily.     . OXcarbazepine (TRILEPTAL) 150 MG tablet Take 1 tablet (150 mg total) by mouth 2 (two) times daily. 90 tablet 0    Allergies  Allergen Reactions  . Doxycycline Swelling    Social History   Tobacco Use  . Smoking status: Current Every Day Smoker    Packs/day: 0.50  . Smokeless tobacco: Former Network engineer Use Topics  . Alcohol use: Yes    Comment: 3 times a month     Family History  Family history unknown: Yes     Review of Systems Pertinent items are noted in HPI.  Objective:   No data found. No intake/output data recorded. Total I/O In: 1200 [I.V.:1200] Out: 750 [Urine:550; Blood:200]    Awake, alert, Ox3 PERRL, EOMI, VFC FC x 4  Data ReviewRadiology review: Left temporal lobe enhancing mass  Assessment:   Large left temporal lobe brain tumor that has shown significant growth  Plan:   Resection today

## 2020-07-05 NOTE — Progress Notes (Signed)
Transport to MRI and back on vent.

## 2020-07-05 NOTE — Progress Notes (Signed)
SLP Cancellation Note  Patient Details Name: Sonya Prince MRN: [id number] DOB: 01-21-1991   Cancelled evaluation:        Orders received for cognitive-linguistic assessment. Pt underwent surgery today; remains orally intubated.  Will follow for readiness.  [name], [place] CCC/SLP Acute Rehabilitation Services Office number [phone] Pager [phone]    [name] 07/05/2020, 2:32 PM

## 2020-07-05 NOTE — Progress Notes (Signed)
Neurosurgery Postop check  Waking up from surgery, localizing briskly x 4.  Incision c/d  - plan to keep her intubated until tomorrow morning.

## 2020-07-06 ENCOUNTER — Encounter (HOSPITAL_COMMUNITY): Payer: Self-pay | Admitting: Neurosurgery

## 2020-07-06 DIAGNOSIS — Z9889 Other specified postprocedural states: Secondary | ICD-10-CM

## 2020-07-06 DIAGNOSIS — D496 Neoplasm of unspecified behavior of brain: Principal | ICD-10-CM

## 2020-07-06 DIAGNOSIS — J95821 Acute postprocedural respiratory failure: Secondary | ICD-10-CM

## 2020-07-06 LAB — CBC
HCT: 36.7 % (ref 36.0–46.0)
Hemoglobin: 11.9 g/dL — ABNORMAL LOW (ref 12.0–15.0)
MCH: 31.5 pg (ref 26.0–34.0)
MCHC: 32.4 g/dL (ref 30.0–36.0)
MCV: 97.1 fL (ref 80.0–100.0)
Platelets: 198 10*3/uL (ref 150–400)
RBC: 3.78 MIL/uL — ABNORMAL LOW (ref 3.87–5.11)
RDW: 11.5 % (ref 11.5–15.5)
WBC: 24.7 10*3/uL — ABNORMAL HIGH (ref 4.0–10.5)
nRBC: 0 % (ref 0.0–0.2)

## 2020-07-06 LAB — BASIC METABOLIC PANEL
Anion gap: 14 (ref 5–15)
BUN: 6 mg/dL (ref 6–20)
CO2: 18 mmol/L — ABNORMAL LOW (ref 22–32)
Calcium: 8.6 mg/dL — ABNORMAL LOW (ref 8.9–10.3)
Chloride: 111 mmol/L (ref 98–111)
Creatinine, Ser: 0.83 mg/dL (ref 0.44–1.00)
GFR calc Af Amer: >60 mL/min (ref >60)
GFR calc non Af Amer: >60 mL/min (ref >60)
Glucose, Bld: 135 mg/dL — ABNORMAL HIGH (ref 70–99)
Potassium: 3.6 mmol/L (ref 3.5–5.1)
Sodium: 143 mmol/L (ref 135–145)

## 2020-07-06 MED ORDER — FENTANYL BOLUS VIA INFUSION: 50.0000 ug | INTRAVENOUS | Status: DC | PRN

## 2020-07-06 MED ORDER — NICOTINE 7 MG/24HR TD PT24
7.0000 mg | MEDICATED_PATCH | Freq: Every day | TRANSDERMAL | Status: DC
  Filled 2020-07-06: qty 1

## 2020-07-06 MED ORDER — MIDAZOLAM HCL 2 MG/2ML IJ SOLN
INTRAMUSCULAR | Status: AC
  Administered 2020-07-06: 2 mg via INTRAVENOUS
  Filled 2020-07-06: qty 2

## 2020-07-06 MED ORDER — POLYETHYLENE GLYCOL 3350 17 G PO PACK: 17.0000 g | PACK | Freq: Every day | ORAL | Status: DC

## 2020-07-06 MED ORDER — CISATRACURIUM BOLUS VIA INFUSION: 10.0000 mg | Freq: Once | INTRAVENOUS | Status: DC

## 2020-07-06 MED ORDER — MIDAZOLAM HCL 2 MG/2ML IJ SOLN: 2.0000 mg | Freq: Once | INTRAMUSCULAR | Status: AC

## 2020-07-06 MED ORDER — DEXAMETHASONE 4 MG PO TABS: 2.0000 mg | ORAL_TABLET | Freq: Two times a day (BID) | ORAL | Status: DC

## 2020-07-06 MED ORDER — SODIUM CHLORIDE 0.9 % IV BOLUS
500.0000 mL | Freq: Once | INTRAVENOUS | Status: AC
  Administered 2020-07-06: 500 mL via INTRAVENOUS

## 2020-07-06 MED ORDER — HALOPERIDOL LACTATE 5 MG/ML IJ SOLN: 2.0000 mg | Freq: Once | INTRAMUSCULAR | Status: DC | PRN

## 2020-07-06 MED ORDER — OXCARBAZEPINE 150 MG PO TABS: 150.0000 mg | ORAL_TABLET | Freq: Two times a day (BID) | ORAL | 2 refills | Status: DC

## 2020-07-06 MED ORDER — DEXAMETHASONE 0.5 MG PO TABS: 1.0000 mg | ORAL_TABLET | Freq: Two times a day (BID) | ORAL | Status: DC

## 2020-07-06 MED ORDER — DEXAMETHASONE 4 MG PO TABS: 4.0000 mg | ORAL_TABLET | Freq: Three times a day (TID) | ORAL | Status: DC

## 2020-07-06 MED ORDER — DEXAMETHASONE 0.5 MG PO TABS: 1.0000 mg | ORAL_TABLET | Freq: Every day | ORAL | Status: DC

## 2020-07-06 MED ORDER — DEXAMETHASONE 4 MG PO TABS: 2.0000 mg | ORAL_TABLET | Freq: Three times a day (TID) | ORAL | Status: DC

## 2020-07-06 MED ORDER — PHENYLEPHRINE HCL-NACL 10-0.9 MG/250ML-% IV SOLN
INTRAVENOUS | Status: AC
  Filled 2020-07-06: qty 250

## 2020-07-06 MED ORDER — DEXAMETHASONE 4 MG PO TABS: 4.0000 mg | ORAL_TABLET | Freq: Four times a day (QID) | ORAL | Status: DC

## 2020-07-06 MED ORDER — DEXAMETHASONE 2 MG PO TABS: ORAL_TABLET | ORAL | 0 refills | Status: AC

## 2020-07-06 MED ORDER — CISATRACURIUM BESYLATE 20 MG/10ML IV SOLN
10.0000 mg | INTRAVENOUS | Status: AC
  Administered 2020-07-06: 10 mg via INTRAVENOUS
  Filled 2020-07-06: qty 10

## 2020-07-06 NOTE — Progress Notes (Signed)
SLP Cancellation Note  Patient Details Name: SHERICA PATERNOSTRO MRN: [id number] DOB: [birth year]   Cancelled treatment:       Reason Eval/Treat Not Completed: Other (comment). Pt too agitated to participate. Will f/u   Banyan Goodchild, Katherene Ponto 07/06/2020, 9:22 AM

## 2020-07-06 NOTE — Progress Notes (Signed)
Pt refusing to allow this RN to assess pt further. Pt refusing all care and only screams "f*ck you, you f*cking stupid dumb b*tch." MD Marcello Moores with neurosurgery  Is aware.

## 2020-07-06 NOTE — Progress Notes (Signed)
Pt left with parents.

## 2020-07-06 NOTE — Progress Notes (Signed)
Wasted 100 mL of fentanyl from bag with RN Leverne Humbles into sink

## 2020-07-06 NOTE — Discharge Instructions (Signed)
Take medications as prescribed Okay to shower starting 07/07/2020

## 2020-07-06 NOTE — Discharge Summary (Signed)
Physician Discharge Summary  Patient ID: Sonya Prince MRN: [id number] DOB/AGE: [birth year] 29 y.o.  Admit date: 07/05/2020 Discharge date: 07/06/2020  Admission Diagnoses:   left temporal brain tumor   Discharge Diagnoses:  Same Active Problems:   Brain tumor [facility])   Discharged Condition: Stable  Hospital Course:  Sonya Prince is a 29 y.o. female  With psychiatric disorder and seizures was found to have a growing large left temporal lobe tumor.   She underwent craniotomy for resection on 07/05/2020.   The patient remained intubated postoperatively for purposes of obtaining  Appropriate postoperative studies, as well as  To  Facilitate observation  And  Postoperative management because of the likelihood that she would demand to leave AMA given her severe impulsiveness and volatile temperament.   Her her MRI showed a gross total resection and  Expected postoperative changes.  On postop day 1, she was extubated with no neurologic deficits noted.  Her activity was advanced in her diet advanced.    Predictably, she demanded to leave and became increasingly verbally abusive to the staff.   As she had been sufficiently observed neurologically  And no further inpatient treatments required, she was deemed ready for discharge home into the care of her parents.   Treatments: Surgery - Left craniotomy for resection of tumor  Discharge Exam: Temp 98.7, BP 134/82 Awake, alert, oriented Speech fluent, but flighty.  Volatile temperament. CN grossly intact 5/5 BUE/BLE Wound c/d/i  Disposition: Discharge disposition: 01-Home or Self Care        Allergies as of 07/06/2020      Reactions   Doxycycline Swelling      Medication List    TAKE these medications   amphetamine-dextroamphetamine 20 MG tablet Commonly known as: ADDERALL Take 20 mg by mouth daily.   clonazePAM 0.5 MG tablet Commonly known as: KLONOPIN Take 0.5 mg by mouth as needed for anxiety.   dexamethasone 2 MG  tablet Commonly known as: DECADRON Take 2 tablets (4 mg total) by mouth every 6 (six) hours for 2 days, THEN 2 tablets (4 mg total) every 8 (eight) hours for 2 days, THEN 1 tablet (2 mg total) every 8 (eight) hours for 2 days, THEN 1 tablet (2 mg total) 2 (two) times daily for 2 days, THEN 0.5 tablets (1 mg total) 2 (two) times daily for 2 days, THEN 0.5 tablets (1 mg total) daily for 2 days. Start taking on: July 06, 2020   diazepam 5 MG tablet Commonly known as: VALIUM Take 5 mg by mouth as directed. Per surgery scheduled   FLUoxetine 20 MG capsule Commonly known as: PROZAC Take 20 mg by mouth daily.   OXcarbazepine 150 MG tablet Commonly known as: TRILEPTAL Take 1 tablet (150 mg total) by mouth 2 (two) times daily. What changed: Another medication with the same name was added. Make sure you understand how and when to take each.   OXcarbazepine 150 MG tablet Commonly known as: TRILEPTAL Take 1 tablet (150 mg total) by mouth 2 (two) times daily. What changed: You were already taking a medication with the same name, and this prescription was added. Make sure you understand how and when to take each.       Follow-up Information    Vallarie Mare, MD Follow up in 2 week(s).   Specialty: Neurosurgery Contact information: [address] [place] [address] [phone]               Signed: Roderic Palau  Elliot Dally 07/06/2020, 5:22 PM
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
## 2020-07-06 NOTE — Procedures (Signed)
Extubation Procedure Note  Patient Details:   Name: Sonya Prince DOB: 08/06/1991 MRN: [id number]   Airway Documentation:  Airway 7.5 mm (Active)  Secured at (cm) 25 cm 07/06/20 0338  Measured From Lips 07/06/20 [place] 07/06/20 0338  Secured By [employer] 07/06/20 0338  Tube Holder Repositioned Yes 07/06/20 0338  Cuff Pressure (cm H2O) 24 cm H2O 07/05/20 1957  Site Condition Dry 07/05/20 1415   Vent end date: 07/06/20 Vent end time: 0644   Evaluation  O2 sats: stable throughout Complications: No apparent complications Patient did tolerate procedure well. Bilateral Breath Sounds: Diminished   Yes Placed patient on 3lpm nasal cannula  Ulice Dash 07/06/2020, 6:45 AM

## 2020-07-06 NOTE — Progress Notes (Signed)
[name] DOB: [birth year] MRN: [id number]   Date of Service  07/06/2020  HPI/Events of Note  Patient wide awake and agitated but not following commands. Decent tidal volumes on CPAP of 5 cm.  eICU Interventions  Sedation stopped, I've requested ground crew to evaluate her for possible extubation.        Kerry Kass Cadence Minton 07/06/2020, 6:32 AM

## 2020-07-06 NOTE — Treatment Plan (Signed)
Patient extubated this morning before start of my shift. Review of labs and vitals signs reveal no critical care need. We will sign off at this time. Please contact us with any questions or concerns.  Lanier Clam, MD

## 2020-07-06 NOTE — Progress Notes (Signed)
Subjective: Patient reports minimal headache.  Was extubated this morning.  Objective: Vital signs in last 24 hours: Temp:  [96.3 F (35.7 C)-98.7 F (37.1 C)] 98.7 F (37.1 C) (08/06 0800) Pulse Rate:  [43-145] 145 (08/06 0700) Resp:  [15-17] 15 (08/06 0700) BP: (81-134)/(38-109) 134/109 (08/06 0700) SpO2:  [100 %] 100 % (08/06 0700) Arterial Line BP: (79-140)/(39-72) 99/46 (08/06 0600) FiO2 (%):  [40 %-100 %] 40 % (08/06 0338)  Intake/Output from previous day: 08/05 0701 - 08/06 0700 In: 3739.6 [I.V.:2960.3; IV Piggyback:779.3] Out: 1850 [Urine:1650; Blood:200] Intake/Output this shift: Total I/O In: 318 [I.V.:318] Out: -   Awake, alert FC x 4, full strength.  Vision grossly intact Incision c/d Abrupt fluctuations in temperament with bouts of hostility, stable from preoperatively  Lab Results: Recent Labs    07/05/20 0825 07/05/20 0825 07/05/20 1040 07/06/20 0500  WBC 7.1  --   --  24.7*  HGB 12.6   < > 12.2 11.9*  HCT 36.7   < > 36.0 36.7  PLT 187  --   --  198   < > = values in this interval not displayed.   BMET Recent Labs    07/05/20 0825 07/05/20 0825 07/05/20 1040 07/06/20 0500  NA 137   < > 137 143  K 3.5   < > 3.5 3.6  CL 105  --   --  111  CO2 23  --   --  18*  GLUCOSE 99  --   --  135*  BUN 10  --   --  6  CREATININE 0.54  --   --  0.83  CALCIUM 8.9  --   --  8.6*   < > = values in this interval not displayed.    Studies/Results: MR BRAIN W WO CONTRAST  Result Date: 07/05/2020 CLINICAL DATA:  Brain tumor.  Resection today. EXAM: MRI HEAD WITHOUT AND WITH CONTRAST TECHNIQUE: Multiplanar, multiecho pulse sequences of the brain and surrounding structures were obtained without and with intravenous contrast. CONTRAST:  30mL GADAVIST GADOBUTROL 1 MMOL/ML IV SOLN COMPARISON:  MRI head 06/21/2020 FINDINGS: Brain: Left parietal craniotomy for tumor resection. Resection cavity containing fluid and blood products. Prior to contrast infusion, there is T1  hyperintensity along the margins of the tumor most consistent with methemoglobin which is somewhat unusual given the recent surgery earlier today. No enhancing tumor is identified. The tumor showed intense enhancement prior to resection. No significant acute perioperative infarct identified. There are bilateral subdural effusions present. Extra-axial fluid is present at the surgical craniotomy site. This measures approximately 5 mm. There is a mild amount of subarachnoid gas over the convexity and in the frontal lobes bilaterally. No other mass lesion identified. Ventricle size normal. Vascular: Normal arterial flow voids. Skull and upper cervical spine: Left parietal craniotomy. Sinuses/Orbits: Mild mucosal edema paranasal sinuses. Mastoid clear bilaterally. Normal orbit Other: None IMPRESSION: Apparent total resection of left parietal mass lesion. No residual enhancing mass identified. Ventricle size normal. No midline shift. Small subdural hygroma bilaterally with pneumocephalus. Electronically Signed   By: Franchot Gallo M.D.   On: 07/05/2020 17:00   [place] Intraoperative  Result Date: 07/05/2020 CLINICAL DATA:  Ultrasound was provided for use by the ordering physician, and a technical charge was applied by the performing facility.  No radiologist interpretation/professional services rendered.   DG Chest Port 1 View  Result Date: 07/05/2020 CLINICAL DATA:  ETT EXAM: PORTABLE CHEST 1 VIEW COMPARISON:  May 21, 2020 FINDINGS: The cardiomediastinal silhouette  is normal in contour. ETT tip terminates 2.5 cm above the carina. Enteric tube side port projects over the proximal stomach. No pleural effusion. No pneumothorax. No acute pleuroparenchymal abnormality. Visualized abdomen is unremarkable. No acute osseous abnormality noted. IMPRESSION: Support apparatus as described above. Electronically Signed   By: Valentino Saxon MD   On: 07/05/2020 14:35    Assessment/Plan: 29 yo F s/p resection of left temporal  lobe brain tumor.  She is doing well postoperatively, though her psychiatric issues remain.  She had a gross total resection on her MRI with expected postoperative changes. - dex taper - continue Trileptal - will likely discharge this afternoon @ 3 pm   Vallarie Mare 07/06/2020, 9:30 AM

## 2020-07-06 NOTE — Progress Notes (Signed)
Neurosurgery  Informed by nursing that patient is demanding to leave and is being verbally abusive to be staff.  This is not surprising- -- last week in the preop area, she behaved very similarly.    My plan was to discharge her this afternoon as she was only extubated this morning, but as it is apparent that to keep her here would require significant amount of sedation and physical restraints, this would defeat the purpose of further neurologic observation.   Her MRI showed a clean resection with expected postoperative changes and she is at her preoperative baseline.  She is oriented and cogent but continues to display volatile temperament with extreme emotional swings and explosive anger.  We will discharge her into the care of her parents with f/u instructions, wound care instructions, and a decadron taper.

## 2020-07-06 NOTE — Progress Notes (Addendum)
Called MD Marcello Moores to inform of pt getting dressed to leave. Pt getting dressed and screams "I'm f*cking leaving" MD Marcello Moores is aware of her leaving. MD Marcello Moores is agreeable if pt's parents are going to take her home. Pt's parents are present.

## 2020-07-06 NOTE — Progress Notes (Signed)
[facility] Progress Note Patient Name: Sonya Prince DOB: 05-24-1991 MRN: [id number]   Date of Service  07/06/2020  HPI/Events of Note  Extreme agitation and ventricular dyssynchrony. Patient is also hypotensive with increase in Propofol sedation.  eICU Interventions  Versed 2 mg iv x 1, Nimbex 10 mg iv x 1, Normal saline 500 ml iv bolus x 1, Phenylephrine infusion ordered to keep MAP < 65 mmHg, wean propofol slowly.        Kerry Kass Shahan Starks 07/06/2020, 4:48 AM

## 2020-07-06 NOTE — Consult Note (Signed)
NAME:  Sonya Prince, MRN:  [id number], DOB:  Feb 12, 1991, LOS: 1 ADMISSION DATE:  07/05/2020, CONSULTATION DATE:  07/06/20 REFERRING MD:  Sonya Rhody, MD CHIEF COMPLAINT:  Vent management  Brief History   29 year old female with history of left-sided brain mass, PTSD, anxiety and seizures in 2020 who was admitted for tumor resection. PCCM consulted for post-op vent management.  History of present illness   [name] is a 29 year old female with history of left-sided brain mass, PTSD, anxiety and seizures in 2020 who was admitted for tumor resection.  She was recently discharged in June 2021 for witnessed grand mal seizure and found with with left temporal lobe massAs an outpatient, she was evaluated by oncology for left temporal brain mass who coordinated consultation with [facility]. She was admitted  On 8/5 for tumor resection.  Unable to obtain history from patient due to intubated status. History obtained by chart review.  Past Medical History  Left temporal brain mass, bipolar, PTSD, seizures, history of meningitis  Significant Hospital Events   8/5-admitted for tumor resection 8/6-transferred to ICU postop on mechanical ventilation  Consults:  Neurosurgery PCCM  Procedures:  8/5 - Left tumor craniotomy for tumor resection  Significant Diagnostic Tests:  MR Brain 07/05/20 - S/p left parietal craniotomy with cavity containing fluid and blood. No evidence of prior enhancing tumor seen. No periop infarct. Interval development of bilateral subdural effusions with pneumocephalus  Micro Data:    Antimicrobials:    Interim history/subjective:  As above Objective   Blood pressure (!) 109/54, pulse (!) 51, temperature 97.7 F (36.5 C), temperature source Oral, resp. rate 16, last menstrual period 06/18/2020, SpO2 100 %.    Vent Mode: PRVC FiO2 (%):  [40 %-100 %] 40 % Set Rate:  [16 bmp] 16 bmp Vt Set:  [410 mL-500 mL] 410 mL PEEP:  [5 cmH20] 5  cmH20 Plateau Pressure:  [13 cmH20-16 cmH20] 13 cmH20   Intake/Output Summary (Last 24 hours) at 07/06/2020 0034 Last data filed at 07/05/2020 1800 Gross per 24 hour  Intake 1626.97 ml  Output 1150 ml  Net 476.97 ml   There were no vitals filed for this visit.  Physical Exam: General: Young female laying in bed on mechanical ventilation, sedated HENT: [place], AT, endotracheal tube Eyes: EOMI, no scleral icterus Respiratory: Clear to auscultation bilaterally.  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  --Continue home Trileptal 150 mg twice daily --Continue Keppra  ADHD PTSD Anxiety --On Klonopin as needed at home --Continue fluoxetine --Continue Adderall Best practice:  Diet: NPO Pain/Anxiety/Delirium protocol (if indicated): Fentanyl propofol VAP protocol (if indicated): Yes DVT prophylaxis: Lovenox GI prophylaxis: Protonix Glucose control: CBG q4h Mobility: PT when eligible Code Status: Full code Family Communication: Per primary team Disposition: Remain in ICU  Labs   CBC: Recent Labs  Lab 07/05/20 0825 07/05/20 1040  WBC 7.1  --   HGB 12.6 12.2  HCT 36.7 36.0  MCV 93.9  --   PLT 187  --     Basic Metabolic Panel: Recent Labs  Lab 07/05/20 0825 07/05/20 1040  NA 137 137  K 3.5 3.5  CL 105  --   CO2 23  --   GLUCOSE 99  --   BUN 10  --   CREATININE 0.54  --   CALCIUM 8.9  --    GFR: Estimated Creatinine  Clearance: 81.2 mL/min (by C-G formula based on SCr of 0.54 mg/dL). Recent Labs  Lab 07/05/20 0825  WBC 7.1    Liver Function Tests: No results for input(s): AST, ALT, ALKPHOS, BILITOT, PROT, ALBUMIN in the last 168 hours. No results for input(s): LIPASE, AMYLASE in the last 168 hours. No results for input(s): AMMONIA in the last 168 hours.  ABG    Component Value Date/Time   PHART 7.404 07/05/2020 1710   PCO2ART 32.8 07/05/2020 1710   PO2ART 443 (H) 07/05/2020 1710   HCO3 20.0 07/05/2020 1710   TCO2 25 07/05/2020 1040   ACIDBASEDEF 3.9 (H) 07/05/2020 1710   O2SAT 99.7 07/05/2020 1710     Coagulation Profile: No results for input(s): INR, PROTIME in the last 168 hours.  Cardiac Enzymes: No results for input(s): CKTOTAL, CKMB, CKMBINDEX, TROPONINI in the last 168 hours.  HbA1C: No results found for: HGBA1C  CBG: Recent Labs  Lab 07/05/20 1928  GLUCAP 137*    Review of Systems:   Unable to obtain due to intubated status  Past Medical History  She,  has a past medical history of Anxiety, Bipolar disorder ([place]), Mass of left temporal lobe, Meningitis, PTSD (post-traumatic stress disorder), and Seizures ([place]).   Surgical History    Past Surgical History:  Procedure Laterality Date  . OPEN REDUCTION INTERNAL FIXATION (ORIF) METACARPAL Right 12/19/2019   Procedure: OPEN TREATMENT OF RIGHT 5TH METACARPAL FRACTURE;  Surgeon: Milly Jakob, MD;  Location: [facility];  Service: Orthopedics;  Laterality: Right;  WITH PREOP REGIONAL BLOCK  . WISDOM TOOTH EXTRACTION       Social History   reports that she has been smoking. She has been smoking about 0.50 packs per day. She has quit using smokeless tobacco. She reports current alcohol use. She reports current drug use. Drug: Marijuana.   Family History   Her Family history is unknown by patient.   Allergies Allergies  Allergen Reactions  . Doxycycline Swelling     Home Medications  Prior to  Admission medications   Medication Sig Start Date End Date Taking? Authorizing Provider  amphetamine-dextroamphetamine (ADDERALL) 20 MG tablet Take 20 mg by mouth daily.     [provider]  clonazePAM (KLONOPIN) 0.5 MG tablet Take 0.5 mg by mouth as needed for anxiety.     [provider]  diazepam (VALIUM) 5 MG tablet Take 5 mg by mouth as directed. Per surgery scheduled    [provider]  FLUoxetine (PROZAC) 20 MG capsule Take 20 mg by mouth daily.    [provider]  OXcarbazepine (TRILEPTAL) 150 MG tablet Take 1 tablet (150 mg total) by mouth 2 (two) times daily. 05/22/20 07/06/20  Terrilee Croak, MD     Critical care time: 72min    The patient is critically ill with multiple organ systems failure and requires high complexity decision making for assessment and support, frequent evaluation and titration of therapies, application of advanced monitoring technologies and extensive interpretation of multiple databases.   Rodman Pickle, M.D. [facility] Pulmonary/Critical Care Medicine 07/06/2020 12:37 AM   Please see Amion for pager number to reach on-call Pulmonary and Critical Care Team.
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
## 2020-07-08 ENCOUNTER — Other Ambulatory Visit: Payer: Self-pay

## 2020-07-09 ENCOUNTER — Other Ambulatory Visit: Payer: Self-pay | Admitting: Physician Assistant

## 2020-07-13 ENCOUNTER — Other Ambulatory Visit: Payer: Self-pay

## 2020-07-13 ENCOUNTER — Encounter (HOSPITAL_COMMUNITY): Payer: Self-pay

## 2020-07-13 ENCOUNTER — Emergency Department (HOSPITAL_COMMUNITY)
Admission: EM | Admit: 2020-07-13 | Discharge: 2020-07-14 | Disposition: A | Discharge status: Home / Self Care | Payer: Self-pay | Attending: Emergency Medicine | Admitting: Emergency Medicine

## 2020-07-13 DIAGNOSIS — R519 Headache, unspecified: Secondary | ICD-10-CM | POA: Insufficient documentation

## 2020-07-13 DIAGNOSIS — Z5321 Procedure and treatment not carried out due to patient leaving prior to being seen by health care provider: Secondary | ICD-10-CM | POA: Insufficient documentation

## 2020-07-13 NOTE — ED Notes (Signed)
Called pt multiple times. No asnwer.
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
## 2020-07-14 ENCOUNTER — Other Ambulatory Visit: Payer: Self-pay

## 2020-07-14 ENCOUNTER — Encounter (HOSPITAL_COMMUNITY): Payer: Self-pay

## 2020-07-14 ENCOUNTER — Emergency Department (HOSPITAL_COMMUNITY)
Admission: EM | Admit: 2020-07-14 | Discharge: 2020-07-14 | Disposition: A | Discharge status: Home / Self Care | Payer: Self-pay | Attending: Emergency Medicine | Admitting: Emergency Medicine

## 2020-07-14 DIAGNOSIS — Y999 Unspecified external cause status: Secondary | ICD-10-CM | POA: Insufficient documentation

## 2020-07-14 DIAGNOSIS — S0990XA Unspecified injury of head, initial encounter: Secondary | ICD-10-CM | POA: Insufficient documentation

## 2020-07-14 DIAGNOSIS — Z5321 Procedure and treatment not carried out due to patient leaving prior to being seen by health care provider: Secondary | ICD-10-CM | POA: Insufficient documentation

## 2020-07-14 DIAGNOSIS — W228XXA Striking against or struck by other objects, initial encounter: Secondary | ICD-10-CM | POA: Insufficient documentation

## 2020-07-14 DIAGNOSIS — Y939 Activity, unspecified: Secondary | ICD-10-CM | POA: Insufficient documentation

## 2020-07-14 DIAGNOSIS — Y929 Unspecified place or not applicable: Secondary | ICD-10-CM | POA: Insufficient documentation
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
## 2020-07-15 ENCOUNTER — Emergency Department (HOSPITAL_COMMUNITY)
Admission: EM | Admit: 2020-07-15 | Discharge: 2020-07-15 | Disposition: A | Discharge status: Home / Self Care | Payer: Self-pay | Attending: Emergency Medicine | Admitting: Emergency Medicine

## 2020-07-15 ENCOUNTER — Emergency Department (HOSPITAL_COMMUNITY): Payer: Self-pay

## 2020-07-15 ENCOUNTER — Encounter (HOSPITAL_COMMUNITY): Payer: Self-pay | Admitting: Emergency Medicine

## 2020-07-15 DIAGNOSIS — F99 Mental disorder, not otherwise specified: Secondary | ICD-10-CM | POA: Insufficient documentation

## 2020-07-15 DIAGNOSIS — R451 Restlessness and agitation: Secondary | ICD-10-CM | POA: Insufficient documentation

## 2020-07-15 DIAGNOSIS — Y999 Unspecified external cause status: Secondary | ICD-10-CM | POA: Insufficient documentation

## 2020-07-15 DIAGNOSIS — Y929 Unspecified place or not applicable: Secondary | ICD-10-CM | POA: Insufficient documentation

## 2020-07-15 DIAGNOSIS — S0990XA Unspecified injury of head, initial encounter: Secondary | ICD-10-CM | POA: Insufficient documentation

## 2020-07-15 DIAGNOSIS — Y939 Activity, unspecified: Secondary | ICD-10-CM | POA: Insufficient documentation

## 2020-07-15 DIAGNOSIS — R454 Irritability and anger: Secondary | ICD-10-CM | POA: Insufficient documentation

## 2020-07-15 DIAGNOSIS — F17210 Nicotine dependence, cigarettes, uncomplicated: Secondary | ICD-10-CM | POA: Insufficient documentation

## 2020-07-15 IMAGING — CT CT HEAD W/O CM
4 series · 17 of 47 positions shown, 19 images · non-contrast
Comparison: [DATE]

CLINICAL DATA: Intracranial mass post resection

EXAM:
CT HEAD WITHOUT CONTRAST
TECHNIQUE: Contiguous axial images were obtained from the base of the skull
through the vertex without intravenous contrast.

[Series 3: head wo · axial · 0.39mm/px · z∈[-120,-5]mm · 7 of 31 slices shown, 9 images]
[im 4/31  brain]
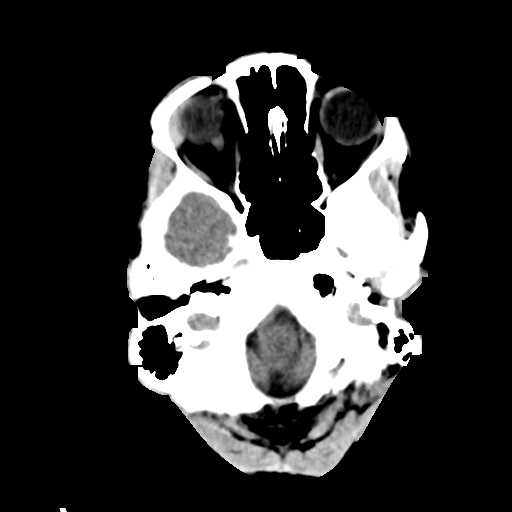
[im 4/31  bone]
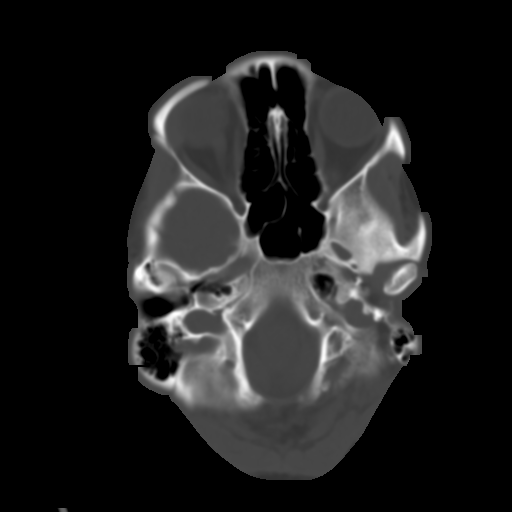
[im 8/31  brain]
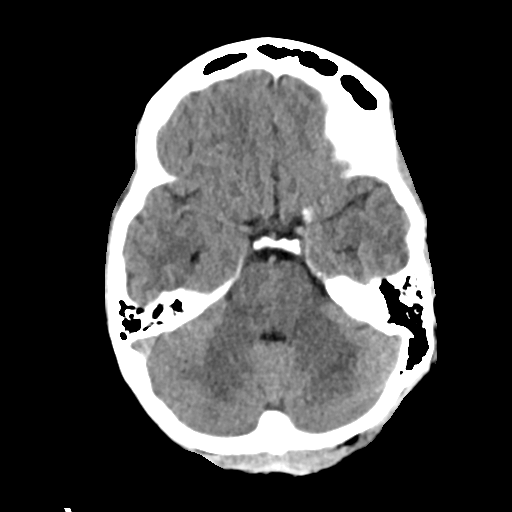
[im 12/31  brain]
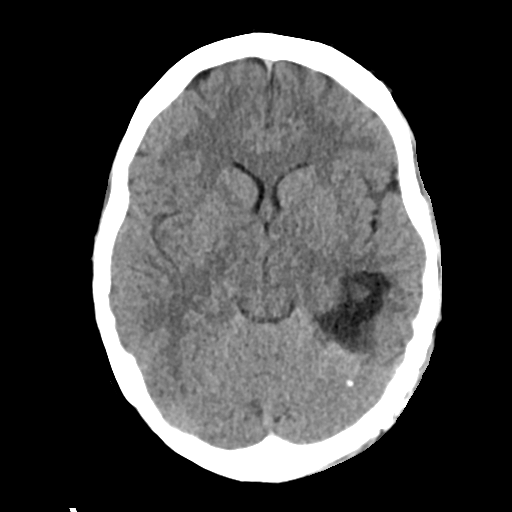
[im 16/31  brain]
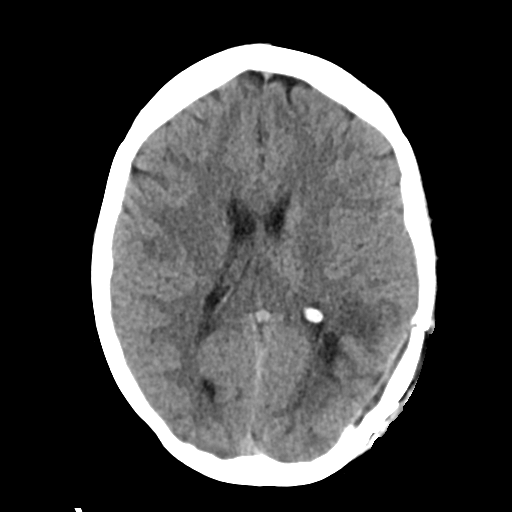
[im 19/31  brain]
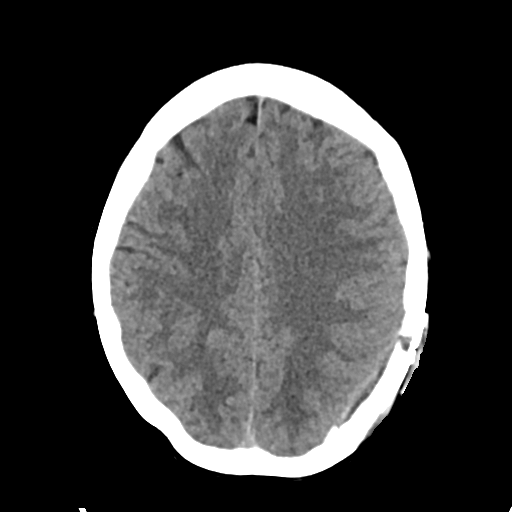
[im 19/31  bone]
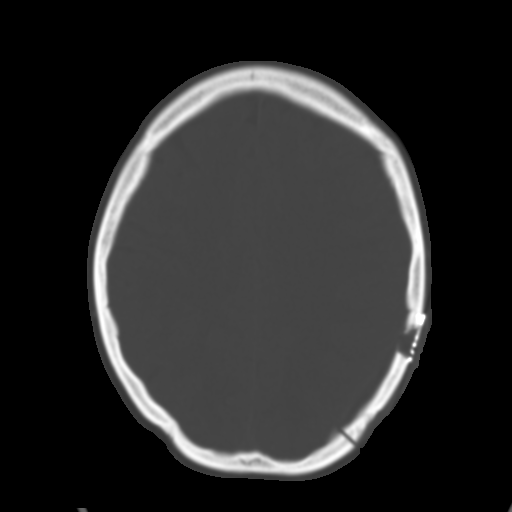
[im 23/31  brain]
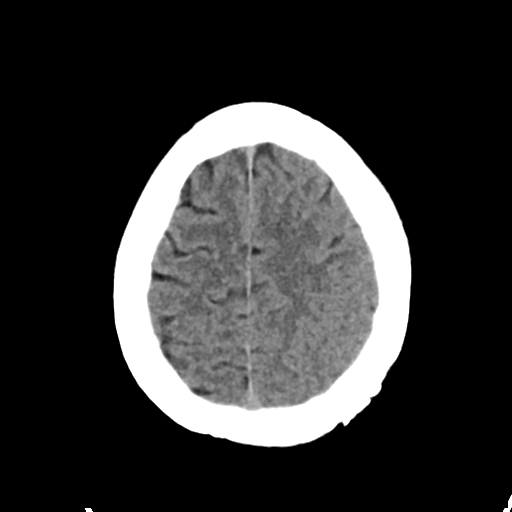
[im 27/31  brain]
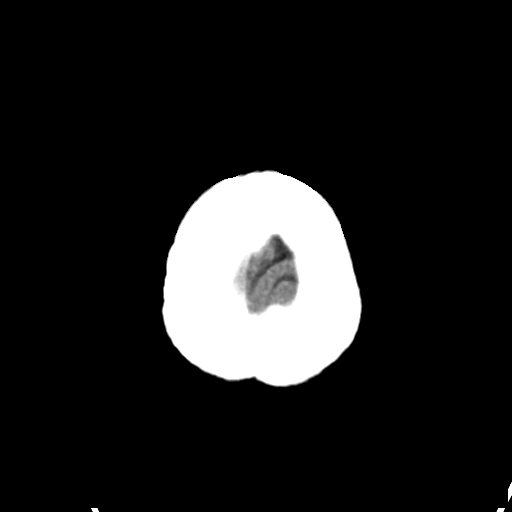

[Series 4: head bone · axial · 0.39mm/px · z∈[-121,-67]mm · 4 of 78 slices shown]
[im 8/78  bone]
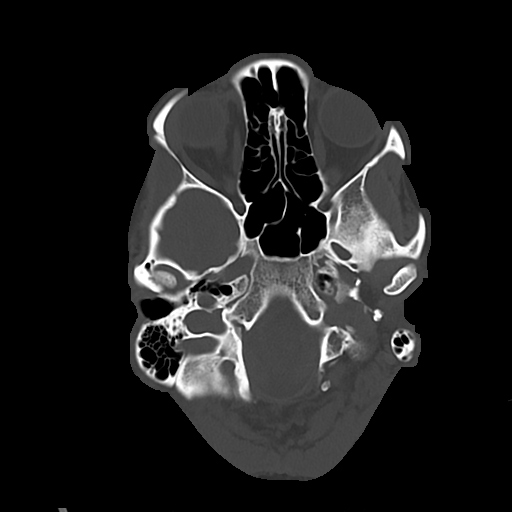
[im 16/78  bone]
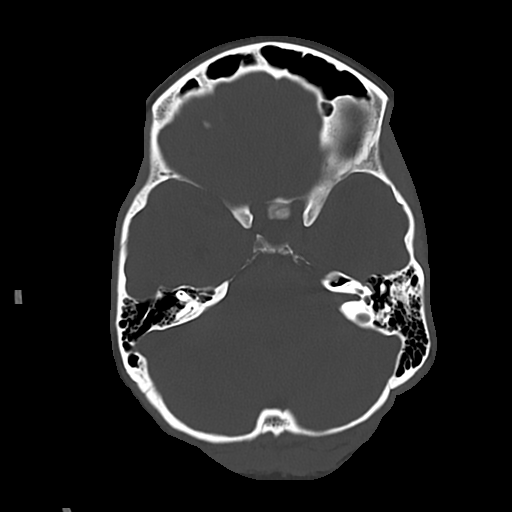
[im 24/78  bone]
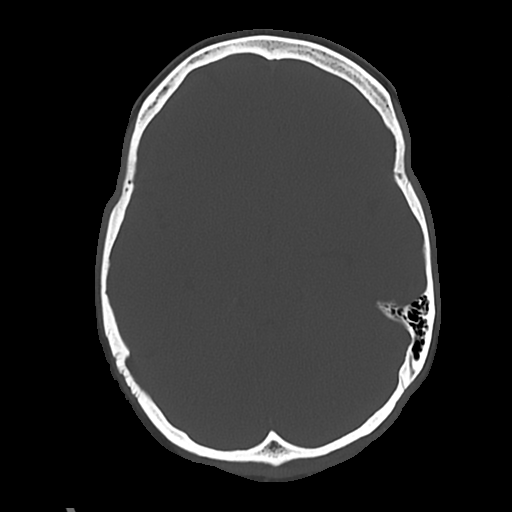
[im 35/78  bone]
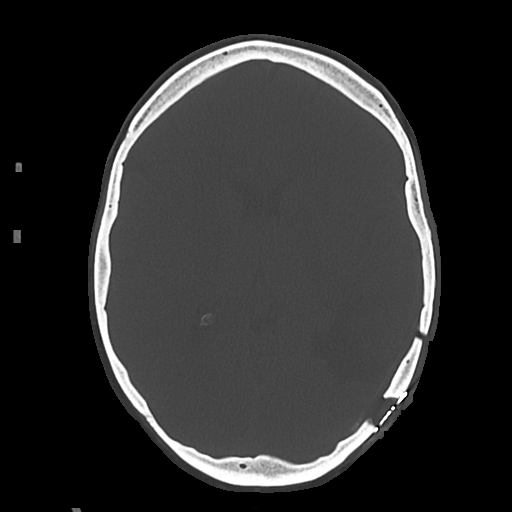

[Series 5: cor soft · coronal · 0.31mm/px · 3 of 63 slices shown]
[im 21/63  brain]
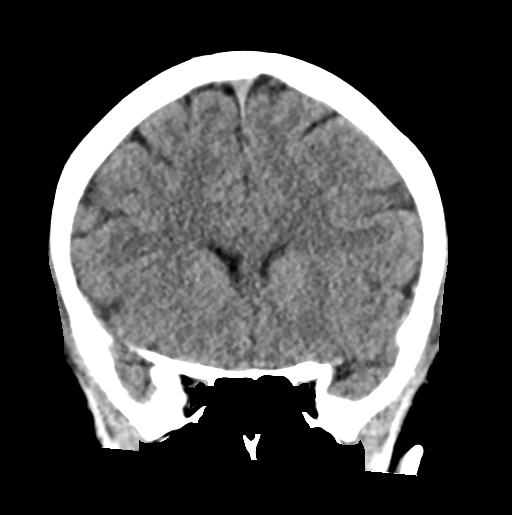
[im 28/63  brain]
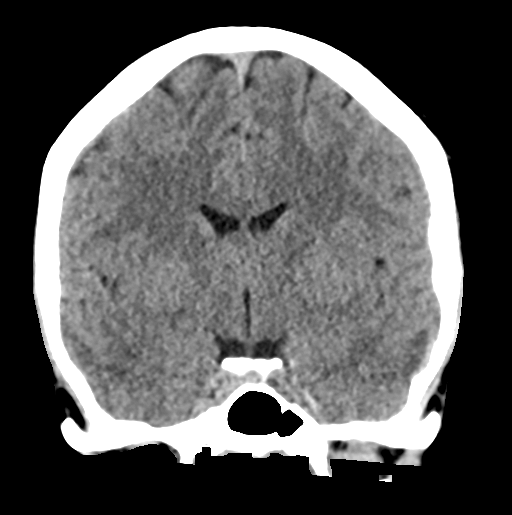
[im 35/63  brain]
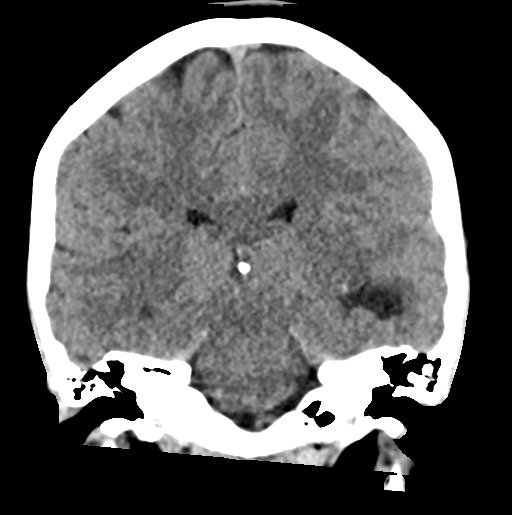

[Series 6: sag soft · sagittal · 0.31mm/px · 3 of 53 slices shown]
[im 18/53  brain]
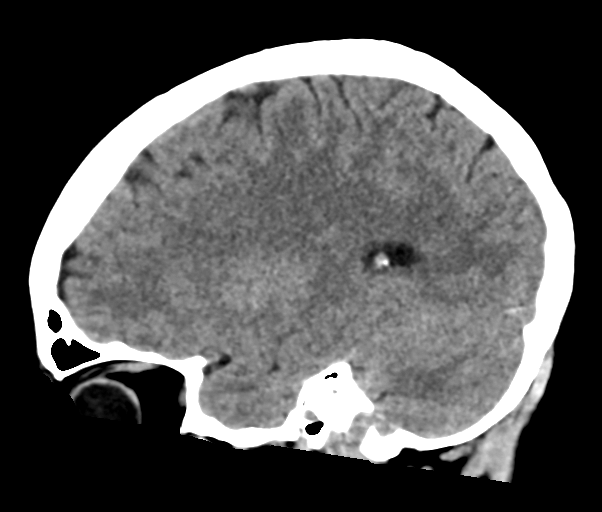
[im 27/53  brain]
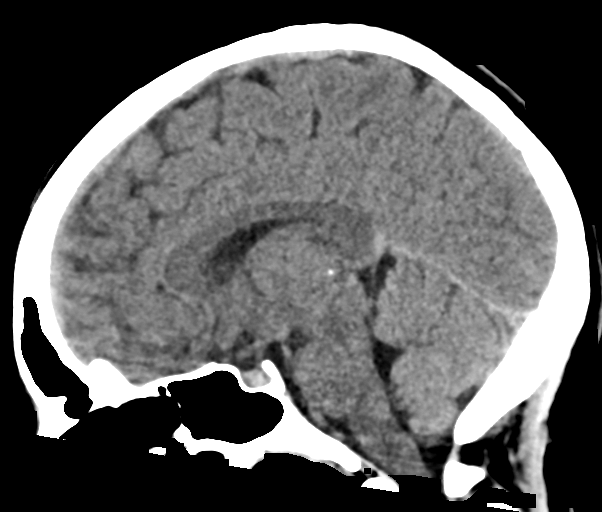
[im 35/53  brain]
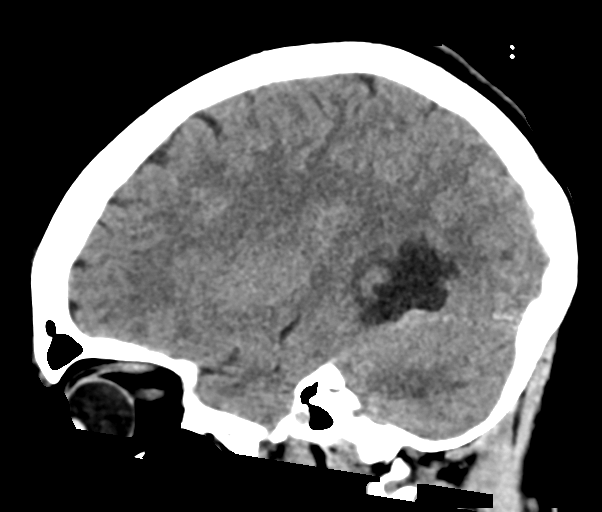

[17 of 47 positions shown; findings below may reference images not displayed]

FINDINGS: Brain: There are postoperative changes of left posterior temporal
lobe mass resection. Thin extra-axial collection is present
underlying the craniotomy. Resection cavity is similar in size to
the postoperative MRI. There remains mild regional mass effect. A
small focus of calcification remains present at the posterior margin
of the resection cavity. No acute intracranial hemorrhage. No
hydrocephalus. Gray-white differentiation remains preserved.

Vascular: Negative.

Skull: Left craniotomy.  Otherwise unremarkable.

Sinuses/Orbits: No acute finding.

Other: None.
IMPRESSION: Postoperative changes of recent posterior left temporal lobe mass
resection. No new hemorrhage or mass effect.
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
## 2020-07-15 NOTE — ED Notes (Signed)
Pt came out of room yelling and cursing about the PA.  States, "she doesn't like my answers but she doesn't have a choice but to f-cking evaluate me."  Pt states, "She needs to quit asking me stupid questions and expect me to answer them the way she wants."  Pt continues to curse and ask what she needs to do.  Encouraged pt to go back in room and shut the door and wait for me to find out what was going on.  She states that she just told me what was going on and stands inside her door continuing to curse and refuses to shut door.  Pt states, "I have the right to say whatever I f-cking want to and you can't do anything about it."  This RN spoke to PA and PA ordered head CT.  Informed pt of same and she continues to be verbally abusive towards staff.  States she was supposed to see the Dr and the person wasn't a Dr that she saw.  CT came to take pt and she was questioning leaving her purse in room.  I told CT tech that pt needs to take her purse with her.  She then started fussing about putting on the gown.  I told her it was up to her if she put it on.  Pt ambulatory to CT and came back to room continuing to be disrespectful and cursing.

## 2020-07-15 NOTE — Discharge Instructions (Addendum)
Your CT scan today did not show any acute findings Please see Dr. Marcello Moores (neurosurgeon) this week for a follow up visit from your surgery Continue medicines prescribed to you
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
## 2020-07-15 NOTE — ED Notes (Signed)
Pt went to lobby to charge her cellphone.

## 2020-07-22 ENCOUNTER — Emergency Department (HOSPITAL_COMMUNITY)
Admission: EM | Admit: 2020-07-22 | Discharge: 2020-07-22 | Disposition: A | Discharge status: Home / Self Care | Payer: Self-pay | Attending: Emergency Medicine | Admitting: Emergency Medicine

## 2020-07-22 ENCOUNTER — Encounter (HOSPITAL_COMMUNITY): Payer: Self-pay

## 2020-07-22 ENCOUNTER — Other Ambulatory Visit: Payer: Self-pay

## 2020-07-22 DIAGNOSIS — F17210 Nicotine dependence, cigarettes, uncomplicated: Secondary | ICD-10-CM | POA: Insufficient documentation

## 2020-07-22 DIAGNOSIS — F17200 Nicotine dependence, unspecified, uncomplicated: Secondary | ICD-10-CM | POA: Insufficient documentation

## 2020-07-22 DIAGNOSIS — Z202 Contact with and (suspected) exposure to infections with a predominantly sexual mode of transmission: Secondary | ICD-10-CM | POA: Insufficient documentation

## 2020-07-22 DIAGNOSIS — Z113 Encounter for screening for infections with a predominantly sexual mode of transmission: Secondary | ICD-10-CM | POA: Insufficient documentation

## 2020-07-22 DIAGNOSIS — F419 Anxiety disorder, unspecified: Secondary | ICD-10-CM

## 2020-07-22 DIAGNOSIS — Z711 Person with feared health complaint in whom no diagnosis is made: Secondary | ICD-10-CM

## 2020-07-22 DIAGNOSIS — Z85841 Personal history of malignant neoplasm of brain: Secondary | ICD-10-CM | POA: Insufficient documentation

## 2020-07-22 LAB — PREGNANCY, URINE: Preg Test, Ur: NEGATIVE

## 2020-07-22 LAB — WET PREP, GENITAL
Clue Cells Wet Prep HPF POC: NONE SEEN
Sperm: NONE SEEN
Trich, Wet Prep: NONE SEEN
Yeast Wet Prep HPF POC: NONE SEEN

## 2020-07-22 MED ORDER — LIDOCAINE HCL 1 % IJ SOLN
INTRAMUSCULAR | Status: AC
  Administered 2020-07-22: 1.1 mL
  Filled 2020-07-22: qty 20

## 2020-07-22 MED ORDER — DIAZEPAM 5 MG PO TABS
5.0000 mg | ORAL_TABLET | Freq: Once | ORAL | Status: AC
  Administered 2020-07-22: 5 mg via ORAL
  Filled 2020-07-22: qty 1

## 2020-07-22 MED ORDER — AZITHROMYCIN 250 MG PO TABS
1000.0000 mg | ORAL_TABLET | Freq: Once | ORAL | Status: AC
  Administered 2020-07-22: 1000 mg via ORAL
  Filled 2020-07-22: qty 4

## 2020-07-22 MED ORDER — CEFTRIAXONE SODIUM 1 G IJ SOLR
500.0000 mg | Freq: Once | INTRAMUSCULAR | Status: AC
  Administered 2020-07-22: 500 mg via INTRAMUSCULAR
  Filled 2020-07-22: qty 10

## 2020-07-22 NOTE — ED Notes (Addendum)
Pt eloped from ED room without notifying staff. MD made aware

## 2020-07-22 NOTE — Discharge Instructions (Addendum)
Please return to the ER for symptoms worsen.

## 2020-07-22 NOTE — Discharge Instructions (Addendum)
If your gonorrhea or chlamydia testing comes back positive we will contact you for appropriate treatment.

## 2020-07-22 NOTE — ED Notes (Signed)
Pelvic cart at bedside. 

## 2020-07-22 NOTE — ED Provider Notes (Signed)
[place] DEPT Provider Note   CSN: [id number] Arrival date & time: 07/22/20  8416     History Chief Complaint  Patient presents with  . Exposure to STD    Sonya Prince is a 29 y.o. female.  HPI   29 year old female with concern for exposure to STD.  Recent unprotected sex.  She denies any specific symptoms otherwise though.  Specifically denies any acute abdominal/pelvic pain.  No unusual vaginal bleeding or discharge.  Past Medical History:  Diagnosis Date  . Anxiety   . Bipolar disorder [facility])    Father denies diagnosis  . Mass of left temporal lobe   . Meningitis   . PTSD (post-traumatic stress disorder)   . Seizures [facility])     Patient Active Problem List   Diagnosis Date Noted  . Brain tumor ([place]) 07/05/2020  . Brain mass 05/31/2020  . Focal seizures ([place]) 05/21/2020  . Vasogenic brain edema ([place]) 05/21/2020  . Nicotine dependence, cigarettes, uncomplicated 60/63/0160  . Toxic metabolic encephalopathy 10/93/2355  . Bipolar affective disorder, currently manic, mild ([place]) 01/17/2019    Past Surgical History:  Procedure Laterality Date  . APPLICATION OF CRANIAL NAVIGATION N/A 07/05/2020   Procedure: APPLICATION OF CRANIAL NAVIGATION;  Surgeon: Vallarie Mare, MD;  Location: [place];  Service: Neurosurgery;  Laterality: N/A;  APPLICATION OF CRANIAL NAVIGATION  . CRANIOTOMY Left 07/05/2020   Procedure: CRANIOTOMY LEFT TEMPORAL FOR TUMOR RESECTION;  Surgeon: Vallarie Mare, MD;  Location: [place];  Service: Neurosurgery;  Laterality: Left;  CRANIOTOMY LEFT TEMPORAL FOR TUMOR RESECTION  . OPEN REDUCTION INTERNAL FIXATION (ORIF) METACARPAL Right 12/19/2019   Procedure: OPEN TREATMENT OF RIGHT 5TH METACARPAL FRACTURE;  Surgeon: Milly Jakob, MD;  Location: [place];  Service: Orthopedics;  Laterality: Right;  WITH PREOP REGIONAL BLOCK  . OPERATIVE ULTRASOUND Left 07/05/2020   Procedure: OPERATIVE ULTRASOUND;  Surgeon:  Vallarie Mare, MD;  Location: [place];  Service: Neurosurgery;  Laterality: Left;  . WISDOM TOOTH EXTRACTION       OB History   No obstetric history on file.     Family History  Family history unknown: Yes    Social History   Tobacco Use  . Smoking status: Current Every Day Smoker    Packs/day: 0.50  . Smokeless tobacco: Former Network engineer  . Vaping Use: Every day  Substance Use Topics  . Alcohol use: Yes    Comment: 3 times a month   . Drug use: Yes    Types: Marijuana    Home Medications Prior to Admission medications   Medication Sig Start Date End Date Taking? Authorizing Provider  acetaminophen (TYLENOL) 500 MG tablet Take 500 mg by mouth every 6 (six) hours as needed for mild pain.     [provider]  amphetamine-dextroamphetamine (ADDERALL) 20 MG tablet Take 20 mg by mouth daily.     [provider]  clonazePAM (KLONOPIN) 0.5 MG tablet Take 0.5 mg by mouth as needed for anxiety.     [provider]  diazepam (VALIUM) 5 MG tablet Take 5 mg by mouth as directed. Per surgery scheduled    [provider]  FLUoxetine (PROZAC) 20 MG capsule Take 20 mg by mouth daily.    [provider]  OXcarbazepine (TRILEPTAL) 150 MG tablet Take 1 tablet (150 mg total) by mouth 2 (two) times daily. 05/22/20 07/06/20  Terrilee Croak, MD  OXcarbazepine (TRILEPTAL) 150 MG tablet Take 1 tablet (150 mg  total) by mouth 2 (two) times daily. 07/06/20   Vallarie Mare, MD    Allergies    Doxycycline  Review of Systems   Review of Systems All systems reviewed and negative, other than as noted in HPI.  Physical Exam Updated Vital Signs BP 116/78   Pulse 96   Temp 98.2 F (36.8 C) (Oral)   Resp 16   LMP 07/20/2020   SpO2 100%   Physical Exam Vitals and nursing note reviewed.  Constitutional:      General: She is not in acute distress.    Appearance: She is well-developed.  HENT:     Head: Normocephalic and atraumatic.  Eyes:      General:        Right eye: No discharge.        Left eye: No discharge.     Conjunctiva/sclera: Conjunctivae normal.  Cardiovascular:     Rate and Rhythm: Normal rate and regular rhythm.     Heart sounds: Normal heart sounds. No murmur heard.  No friction rub. No gallop.   Pulmonary:     Effort: Pulmonary effort is normal. No respiratory distress.     Breath sounds: Normal breath sounds.  Abdominal:     General: There is no distension.     Palpations: Abdomen is soft.     Tenderness: There is no abdominal tenderness.  Genitourinary:    Comments: Two chaperones present. Normal external genitalia. Small amount of dark blood. No significant discharge. Normal appearing cervix. Bimanual exam deferred.  Musculoskeletal:        General: No tenderness.     Cervical back: Neck supple.  Skin:    General: Skin is warm and dry.  Neurological:     Mental Status: She is alert.  Psychiatric:        Behavior: Behavior normal.        Thought Content: Thought content normal.     ED Results / Procedures / Treatments   Labs (all labs ordered are listed, but only abnormal results are displayed) Labs Reviewed - No data to display  EKG None  Radiology No results found.  Procedures Procedures (including critical care time)  Medications Ordered in ED Medications - No data to display  ED Course  I have reviewed the triage vital signs and the nursing notes.  Pertinent labs & imaging results that were available during my care of the patient were reviewed by me and considered in my medical decision making (see chart for details).    MDM Rules/Calculators/A&P                          29 year old female with recent unprotected intercourse and concern for possible STD.  No overt symptoms though.  Testing sent.  Empiric treatment deferred at this time.  Patient ended up eloping from the emergency room prior to additional discussion after wet prep.  Final Clinical Impression(s) / ED  Diagnoses Final diagnoses:  Concern about STD in female without diagnosis    Rx / DC Orders ED Discharge Orders    None       Virgel Manifold, MD 07/25/20 [phone]

## 2020-07-22 NOTE — ED Notes (Signed)
Kohut MD at bedside- Completing pelvic exam with this RN and Melody RN as chaperons.

## 2020-07-22 NOTE — ED Triage Notes (Signed)
Arrived POV from home. Patient reports she is here for STD check because she recently had STD check. Patient says she has alcohol in her pocket book, but took all the drugs that she had.

## 2020-07-22 NOTE — ED Provider Notes (Signed)
[place] DEPT Provider Note   CSN: [id number] Arrival date & time: 07/22/20  2044     History No chief complaint on file.   Sonya Prince is a 29 y.o. female.  HPI 29 year old female with history of bipolar disorder, PTSD, seizures presents to the ER with complaints of anxiety attacks.  Patient was seen here earlier in the ED with complaints of an STD.  Patient did have a pelvic performed but eloped without telling staff.  She returns stating that she has been feeling some anxiety and has run out of her anxiety medications.  She states that she has a psychiatrist that she follows with but has not followed up with him.  States she has smoked some marijuana but denies any alcohol or drug use.  Triage notes note that she had reported alcohol use.  She denies any SI/HI.  No recent seizures.    Past Medical History:  Diagnosis Date  . Anxiety   . Bipolar disorder [facility])    Father denies diagnosis  . Mass of left temporal lobe   . Meningitis   . PTSD (post-traumatic stress disorder)   . Seizures [facility])     Patient Active Problem List   Diagnosis Date Noted  . Brain tumor ([place]) 07/05/2020  . Brain mass 05/31/2020  . Focal seizures ([place]) 05/21/2020  . Vasogenic brain edema ([place]) 05/21/2020  . Nicotine dependence, cigarettes, uncomplicated 95/18/8416  . Toxic metabolic encephalopathy 60/63/0160  . Bipolar affective disorder, currently manic, mild ([place]) 01/17/2019    Past Surgical History:  Procedure Laterality Date  . APPLICATION OF CRANIAL NAVIGATION N/A 07/05/2020   Procedure: APPLICATION OF CRANIAL NAVIGATION;  Surgeon: Vallarie Mare, MD;  Location: [place];  Service: Neurosurgery;  Laterality: N/A;  APPLICATION OF CRANIAL NAVIGATION  . CRANIOTOMY Left 07/05/2020   Procedure: CRANIOTOMY LEFT TEMPORAL FOR TUMOR RESECTION;  Surgeon: Vallarie Mare, MD;  Location: [place];  Service: Neurosurgery;  Laterality: Left;  CRANIOTOMY LEFT TEMPORAL FOR  TUMOR RESECTION  . OPEN REDUCTION INTERNAL FIXATION (ORIF) METACARPAL Right 12/19/2019   Procedure: OPEN TREATMENT OF RIGHT 5TH METACARPAL FRACTURE;  Surgeon: Milly Jakob, MD;  Location: [place];  Service: Orthopedics;  Laterality: Right;  WITH PREOP REGIONAL BLOCK  . OPERATIVE ULTRASOUND Left 07/05/2020   Procedure: OPERATIVE ULTRASOUND;  Surgeon: Vallarie Mare, MD;  Location: [place];  Service: Neurosurgery;  Laterality: Left;  . WISDOM TOOTH EXTRACTION       OB History   No obstetric history on file.     Family History  Family history unknown: Yes    Social History   Tobacco Use  . Smoking status: Current Every Day Smoker    Packs/day: 0.50  . Smokeless tobacco: Former Network engineer  . Vaping Use: Every day  Substance Use Topics  . Alcohol use: Yes    Comment: 3 times a month   . Drug use: Yes    Types: Marijuana    Home Medications Prior to Admission medications   Medication Sig Start Date End Date Taking? Authorizing Provider  acetaminophen (TYLENOL) 500 MG tablet Take 500 mg by mouth every 6 (six) hours as needed for mild pain.     [provider]  amphetamine-dextroamphetamine (ADDERALL) 20 MG tablet Take 20 mg by mouth daily.     [provider]  clonazePAM (KLONOPIN) 0.5 MG tablet Take 0.5 mg by mouth as needed for anxiety.     [provider]  diazepam (VALIUM) 5 MG tablet Take 5 mg by mouth as directed. Per surgery scheduled    [provider]  FLUoxetine (PROZAC) 20 MG capsule Take 20 mg by mouth daily.    [provider]  OXcarbazepine (TRILEPTAL) 150 MG tablet Take 1 tablet (150 mg total) by mouth 2 (two) times daily. 05/22/20 07/06/20  Terrilee Croak, MD  OXcarbazepine (TRILEPTAL) 150 MG tablet Take 1 tablet (150 mg total) by mouth 2 (two) times daily. 07/06/20   Vallarie Mare, MD    Allergies    Doxycycline  Review of Systems   Review of Systems  Respiratory: Negative for shortness of  breath.   Cardiovascular: Negative for chest pain.  Psychiatric/Behavioral: The patient is nervous/anxious.     Physical Exam Updated Vital Signs BP 116/70   Pulse (!) 113   Temp 98.4 F (36.9 C)   Resp 20   LMP 07/20/2020   SpO2 98%   Physical Exam Vitals reviewed.  Constitutional:      General: She is not in acute distress.    Appearance: Normal appearance. She is not ill-appearing, toxic-appearing or diaphoretic.  HENT:     Head: Normocephalic and atraumatic.  Eyes:     General:        Right eye: No discharge.        Left eye: No discharge.     Extraocular Movements: Extraocular movements intact.     Conjunctiva/sclera: Conjunctivae normal.  Musculoskeletal:        General: No swelling. Normal range of motion.  Skin:    General: Skin is warm and dry.  Neurological:     General: No focal deficit present.     Mental Status: She is alert and oriented to person, place, and time.     Sensory: Sensory deficit present.     Motor: No weakness.  Psychiatric:        Mood and Affect: Mood is anxious.        Behavior: Behavior normal.     ED Results / Procedures / Treatments   Labs (all labs ordered are listed, but only abnormal results are displayed) Labs Reviewed - No data to display  EKG EKG Interpretation  Date/Time:  Sunday July 22 2020 22:55:09 EDT Ventricular Rate:  78 PR Interval:  136 QRS Duration: 62 QT Interval:  392 QTC Calculation: 446 R Axis:   84 Text Interpretation: Sinus rhythm with marked sinus arrhythmia Otherwise normal ECG Since last tracing rate slower Confirmed by Knapp, Jon ([id number]) on 07/22/2020 11:12:50 PM   Radiology No results found.  Procedures Procedures (including critical care time)  Medications Ordered in ED Medications  diazepam (VALIUM) tablet 5 mg (5 mg Oral Given 07/22/20 2244)  cefTRIAXone (ROCEPHIN) injection 500 mg (500 mg Intramuscular Given 07/22/20 2255)  azithromycin (ZITHROMAX) tablet 1,000 mg (1,000 mg Oral  Given 07/22/20 2244)  lidocaine (XYLOCAINE) 1 % (with pres) injection (1.1 mLs  Given 07/22/20 2255)    ED Course  I have reviewed the triage vital signs and the nursing notes.  Pertinent labs & imaging results that were available during my care of the patient were reviewed by me and considered in my medical decision making (see chart for details).    MDM Rules/Calculators/A&P                         29  year old female presents with complaints of anxiety.  Reports previous history of this and ran out of her Valium.  Denies any recent seizures. No exopthalmos, negative pregnancy test earlier today.  Denies any SI/HI.  Reports some marijuana use, triage notes no alcohol.  No signs of withdrawal.  Patient's vitals slightly tachycardic likely consistent with anxiety and recent alcohol use.  Patient was informed that we cannot refill her anxiety medication here, however I am happy to provide her a dose here in the ED.  She voices understanding and is agreeable.  Would prefer to be treated for STD prophylaxis here today.  Per chart review patient has an allergy to doxycycline, patient received Rocephin IM and 1 g of azithromycin orally here in the ED.  Pt was also given home dose of valium. EKG with sinus rhythm. Stable for discharge. Return precautions discussed. She voiced understanding and is agreeable .  Final Clinical Impression(s) / ED Diagnoses Final diagnoses:  Anxiety    Rx / DC Orders ED Discharge Orders    None       Lyndel Safe 07/22/20 2314    Dorie Rank, MD 07/24/20 1246

## 2020-07-22 NOTE — ED Triage Notes (Signed)
Patient arrived stating she is having anxiety and needs some medication refills. Declines SI or HI at this time. Patient reports alcohol use and drug use but did not state which drug.

## 2020-07-23 ENCOUNTER — Encounter (HOSPITAL_COMMUNITY): Payer: Self-pay | Admitting: Neurosurgery

## 2020-07-23 LAB — GC/CHLAMYDIA PROBE AMP (~~LOC~~) NOT AT ARMC
Chlamydia: NEGATIVE
Comment: NEGATIVE
Comment: NORMAL
Neisseria Gonorrhea: NEGATIVE

## 2020-07-23 LAB — SURGICAL PATHOLOGY

## 2020-07-25 ENCOUNTER — Other Ambulatory Visit: Payer: Self-pay | Admitting: Radiation Therapy

## 2020-08-01 ENCOUNTER — Encounter (HOSPITAL_COMMUNITY): Payer: Self-pay | Admitting: Neurosurgery

## 2020-08-03 ENCOUNTER — Inpatient Hospital Stay: Payer: Self-pay | Attending: Internal Medicine | Admitting: Internal Medicine

## 2020-08-03 ENCOUNTER — Telehealth: Payer: Self-pay | Admitting: *Deleted

## 2020-08-03 NOTE — Telephone Encounter (Signed)
TCT patient's mother and her father as pt did not show for her appt. No answer on either phone. Unable to leave vm message

## 2020-08-07 ENCOUNTER — Telehealth: Payer: Self-pay | Admitting: Internal Medicine

## 2020-08-07 NOTE — Telephone Encounter (Signed)
Called pt per 9/3 sch msg - no vmail and unable to reach pt .

## 2020-08-14 ENCOUNTER — Encounter (HOSPITAL_COMMUNITY): Payer: Self-pay | Admitting: *Deleted

## 2020-08-14 ENCOUNTER — Emergency Department (HOSPITAL_COMMUNITY)
Admission: EM | Admit: 2020-08-14 | Discharge: 2020-08-14 | Disposition: A | Discharge status: Home / Self Care | Payer: Self-pay | Attending: Emergency Medicine | Admitting: Emergency Medicine

## 2020-08-14 DIAGNOSIS — R456 Violent behavior: Secondary | ICD-10-CM | POA: Insufficient documentation

## 2020-08-14 DIAGNOSIS — Z4889 Encounter for other specified surgical aftercare: Secondary | ICD-10-CM | POA: Insufficient documentation

## 2020-08-14 DIAGNOSIS — R451 Restlessness and agitation: Secondary | ICD-10-CM | POA: Insufficient documentation

## 2020-08-14 DIAGNOSIS — Z3202 Encounter for pregnancy test, result negative: Secondary | ICD-10-CM | POA: Insufficient documentation

## 2020-08-14 DIAGNOSIS — Z79899 Other long term (current) drug therapy: Secondary | ICD-10-CM | POA: Insufficient documentation

## 2020-08-14 DIAGNOSIS — F419 Anxiety disorder, unspecified: Secondary | ICD-10-CM | POA: Insufficient documentation

## 2020-08-14 DIAGNOSIS — F17200 Nicotine dependence, unspecified, uncomplicated: Secondary | ICD-10-CM | POA: Insufficient documentation

## 2020-08-14 DIAGNOSIS — Z85841 Personal history of malignant neoplasm of brain: Secondary | ICD-10-CM | POA: Insufficient documentation

## 2020-08-14 DIAGNOSIS — Z09 Encounter for follow-up examination after completed treatment for conditions other than malignant neoplasm: Secondary | ICD-10-CM

## 2020-08-14 DIAGNOSIS — R454 Irritability and anger: Secondary | ICD-10-CM | POA: Insufficient documentation

## 2020-08-14 LAB — POC URINE PREG, ED: Preg Test, Ur: NEGATIVE

## 2020-08-14 NOTE — ED Provider Notes (Signed)
[facility] EMERGENCY DEPARTMENT Provider Note   CSN: [id number] Arrival date & time: 08/14/20  0419     History Chief Complaint  Patient presents with  . Anxiety   Level 5 caveat due to lack of patient cooperation  Sonya Prince is a 29 y.o. female with history of anxiety, bipolar disorder, PTSD, seizures, brain tumor status post resection on 07/05/2020 presents to the ED for evaluation.  Triage note from 4:25 AM this morning states that she was brought in via EMS for evaluation of anxiety and difficulty with memory though this is noted to be baseline for her.  At that time she denied nausea, vomiting, or headache.  She was also reportedly alert and oriented.  When asked by myself and her nurse what brings her to the emergency department the patient becomes somewhat aggressive and states "do not have all that stuff in the computer?".  She frequently has difficulty answering questions regarding what brings her to the emergency department.  At one point she states "while I just had brain surgery and I missed my appointment and I tried to call but they could not make an appointment for me".  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  Review of pathology report received 07/13/2020 from [facility] notes "glial neoplasm consistent with astroblastoma".  Telephone encounters from 08/03/2020 and 08/07/2020 note that Dr. Renda Rolls office attempted to reach out to the patient regarding missed appointment but attempts at making contact were unsuccessful as the patient does not have a voicemail set up and there was no answer on the phone.  Staff also tried to contact the patient's mother and father. Per chart review, patient's father Jon's number is [phone] and mother Anne's number is [phone].   Chart review notes the patient has a history of being verbally aggressive towards staff and  others.  She frequently stares at my badge and states "that's not your badge. Bitch, that's not you. Did you have reconstructive surgery?", and at one point attempted to lunge towards me to pull my mask off my face. She is requesting a pregnancy test. When asked if I could obtain collateral information from her significant other she refused.   The history is provided by medical records and the patient. The history is limited by the condition of the patient.       Past Medical History:  Diagnosis Date  . Anxiety   . Bipolar disorder [facility])    Father denies diagnosis  . Mass of left temporal lobe   . Meningitis   . PTSD (post-traumatic stress disorder)   . Seizures [facility])     Patient Active Problem List   Diagnosis Date Noted  . Brain tumor ([place]) 07/05/2020  . Brain mass 05/31/2020  . Focal seizures ([place]) 05/21/2020  . Vasogenic brain edema ([place]) 05/21/2020  . Nicotine dependence, cigarettes, uncomplicated 66/59/9357  . Toxic metabolic encephalopathy 01/77/9390  . Bipolar affective disorder, currently manic, mild ([place]) 01/17/2019    Past Surgical History:  Procedure Laterality Date  . APPLICATION OF CRANIAL NAVIGATION N/A 07/05/2020   Procedure: APPLICATION OF CRANIAL NAVIGATION;  Surgeon: Vallarie Mare, MD;  Location: [facility];  Service: Neurosurgery;  Laterality: N/A;  APPLICATION OF CRANIAL NAVIGATION  . CRANIOTOMY Left 07/05/2020   Procedure: CRANIOTOMY LEFT TEMPORAL FOR TUMOR RESECTION;  Surgeon: Vallarie Mare, MD;  Location: [place];  Service: Neurosurgery;  Laterality: Left;  CRANIOTOMY LEFT TEMPORAL  FOR TUMOR RESECTION  . OPEN REDUCTION INTERNAL FIXATION (ORIF) METACARPAL Right 12/19/2019   Procedure: OPEN TREATMENT OF RIGHT 5TH METACARPAL FRACTURE;  Surgeon: Milly Jakob, MD;  Location: [facility];  Service: Orthopedics;  Laterality: Right;  WITH PREOP REGIONAL BLOCK  . OPERATIVE ULTRASOUND Left 07/05/2020   Procedure: OPERATIVE ULTRASOUND;  Surgeon:  Vallarie Mare, MD;  Location: [facility];  Service: Neurosurgery;  Laterality: Left;  . WISDOM TOOTH EXTRACTION       OB History   No obstetric history on file.     Family History  Family history unknown: Yes    Social History   Tobacco Use  . Smoking status: Current Every Day Smoker    Packs/day: 0.50  . Smokeless tobacco: Former Network engineer  . Vaping Use: Every day  Substance Use Topics  . Alcohol use: Yes    Comment: 3 times a month   . Drug use: Yes    Types: Marijuana    Home Medications Prior to Admission medications   Medication Sig Start Date End Date Taking? Authorizing Provider  amphetamine-dextroamphetamine (ADDERALL) 20 MG tablet Take 20 mg by mouth daily.    Yes [provider]  clonazePAM (KLONOPIN) 0.5 MG tablet Take 0.5 mg by mouth as needed for anxiety.    Yes [provider]  diazepam (VALIUM) 5 MG tablet Take 5 mg by mouth as directed. Per surgery scheduled   Yes [provider]  OXcarbazepine (TRILEPTAL) 150 MG tablet Take 1 tablet (150 mg total) by mouth 2 (two) times daily. 07/06/20  Yes Vallarie Mare, MD  acetaminophen (TYLENOL) 500 MG tablet Take 500 mg by mouth every 6 (six) hours as needed for mild pain.     [provider]  FLUoxetine (PROZAC) 20 MG capsule Take 20 mg by mouth daily.    [provider]  OXcarbazepine (TRILEPTAL) 150 MG tablet Take 1 tablet (150 mg total) by mouth 2 (two) times daily. 05/22/20 07/06/20  Terrilee Croak, MD    Allergies    Doxycycline  Review of Systems   Review of Systems  Unable to perform ROS: Psychiatric disorder    Physical Exam Updated Vital Signs BP 106/67 (BP Location: Left Arm)   Pulse (!) 103   Temp 98.2 F (36.8 C) (Oral)   Resp 18   LMP 07/20/2020   SpO2 99%   Physical Exam Vitals and nursing note reviewed.  Constitutional:      General: She is not in acute distress.    Appearance: She is well-developed.     Comments: Sitting in chair,  arms folded in a defensive position  HENT:     Head: Normocephalic and atraumatic.  Eyes:     General:        Right eye: No discharge.        Left eye: No discharge.     Extraocular Movements: Extraocular movements intact.     Conjunctiva/sclera: Conjunctivae normal.  Neck:     Vascular: No JVD.     Trachea: No tracheal deviation.  Cardiovascular:     Rate and Rhythm: Normal rate.  Pulmonary:     Effort: Pulmonary effort is normal.  Abdominal:     General: There is no distension.  Musculoskeletal:     Cervical back: Neck supple.  Skin:    Findings: No erythema.  Neurological:     Mental Status: She is alert.     Comments: Speech fluent with no evidence of dysarthria  or aphasia.  No cranial nerve deficit noted.  Moves all extremities spontaneously without difficulty.  Psychiatric:        Mood and Affect: Affect is angry.        Speech: Speech normal.        Behavior: Behavior is uncooperative and agitated.        Judgment: Judgment is impulsive.     ED Results / Procedures / Treatments   Labs (all labs ordered are listed, but only abnormal results are displayed) Labs Reviewed  POC URINE PREG, ED    EKG None  Radiology No results found.  Procedures Procedures (including critical care time)  Medications Ordered in ED Medications - No data to display  ED Course  I have reviewed the triage vital signs and the nursing notes.  Pertinent labs & imaging results that were available during my care of the patient were reviewed by me and considered in my medical decision making (see chart for details).    MDM Rules/Calculators/A&P                          Patient presents to the ED.  Triage note mentions significant other called for EMS due to anxiety and memory difficulties although these appear baseline.  She is mildly tachycardic although this is also not unusual for her.  Vital signs otherwise stable.  On initial assessment she is combative, perseverates on the  idea that I am not who I say I am and that I have the wrong ID badge.  She is a longstanding history of agitation towards staff.  Cursory examination reassuring, no evidence of any focal neurologic deficits.  No evidence of respiratory distress.  She is requesting a pregnancy test which I am happy to oblige.  Chart review reveals that she had a tumor resected on August 5.  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  It appears that she has not been able to follow-up with neuro oncology and the office has been attempting to contact her to reschedule her appointments but have been on unsuccessful in reaching her.  At this time I do not feel that she requires repeat imaging.  2:57PM CONSULT: Spoke with Dr. Mickeal Skinner with neuro oncology who is familiar with the patient and has been trying to contact her so that she can be evaluated in the office to discuss results.  He has contacted the patient's parents and they do not know where the patient is.  He states that the form of cancer she has is rare and follow-up is imperative.  He encouraged me to reach out to family to see if they can help as the last time the patient was evaluated with him was in the presence of her father.  3:05PM Spoke with patient's father over the phone.  He states that he and his daughter are estranged and that he has unfortunately been through a great deal of difficulty with her.  He states "I love my daughter but I do not know what to do anymore".  He states that he would not be able to make sure that the patient could follow-up in Dr. Renda Rolls office.  He does not know the information of the patient's significant other.  He requested that I do not inform the patient that I contacted him.  I spoke with the patient and informed her of her negative pregnancy test to which she stated "that is good".  She appears calm and cooperative at this  time.  I informed her that I spoke with Dr. Mickeal Skinner and relayed that he has availability to see her  in the office on Thursday afternoon.  I consulted case management, spoke with Gean Quint who will speak to the patient to try to help arrange transportation to the office on Thursday. They have arranged for taxi to pick her up from the [facility] at 1pm on Thursday.   I sent a staff message to Dr. Mickeal Skinner to inform him of developments since our conversation.  Patient currently hemodynamically stable for discharge at this time.  We discussed indications for return to the ED she verbalized understanding of and agreement with this.    Final Clinical Impression(s) / ED Diagnoses Final diagnoses:  Negative pregnancy test  Need for follow-up care after discharge    Rx / DC Orders ED Discharge Orders    None       Debroah Baller 08/14/20 1640    Davonna Belling, MD 08/16/20 [phone]

## 2020-08-14 NOTE — ED Notes (Signed)
Sort called, no response

## 2020-08-14 NOTE — Care Management (Signed)
Patient scheduled for ride from  [place] to [employer] on 08/16/2020 at 1pm. Patient given information regarding this plan. She is to be at the [facility] at 1300 for pick up. Bus ticket given to patient for transport on discharge from ED.

## 2020-08-14 NOTE — ED Notes (Signed)
Patient came back inside .she had been outside

## 2020-08-14 NOTE — ED Triage Notes (Signed)
To ED via [facility] for eval of pt - pt states her boyfriend called because she is anxious and is having trouble remembering things. Pt states this is not new. Unable to tell writer what meds she is on. Denies nausea or vomiting and states no HA at this time. Alert and oriented. Appears in nad.

## 2020-08-14 NOTE — Discharge Instructions (Signed)
I have spoken with Dr. Mickeal Skinner who has availability Thursday afternoon to see you in the office.  Please go to his office on Thursday after 12pm and he will be able to go through all of your results with you. Case management will help arrange for transportation.   Please return to the emergency department if anything changes or worsens.

## 2020-08-14 NOTE — ED Notes (Signed)
Reviewed discharge instructions with patient. Follow-up care reviewed. Patient verbalized understanding. Patient A&Ox4, VSS, and ambulatory with steady gait upon discharge.  

## 2020-08-14 NOTE — ED Notes (Signed)
Pt reports her boyfriend said she is paranoid. She says she is here for a pregnancy test. Pt repeatedly told the PA that she was wearing the wrong badge and repeatedly said, "Bitch that's not your face". Pt attempted to take mask off of PA. This RN put her arm in front of pt's arm so that she couldn't try to take off the PA's mask. It was explained to pt that she cannot touch staff like that.

## 2020-09-05 ENCOUNTER — Telehealth: Payer: Self-pay | Admitting: *Deleted

## 2020-09-05 NOTE — Telephone Encounter (Signed)
Called mom to see if she has had any communication with daughter and stressed importance of needing to schedule appt with Dr Mickeal Skinner.  She will call back if she is able to reach patient to schedule visit.

## 2020-10-01 ENCOUNTER — Telehealth: Payer: Self-pay | Admitting: *Deleted

## 2020-10-01 NOTE — Telephone Encounter (Signed)
Received call from patients mother to advise that she found out that the patient is presently in [facility] center.    Hopefully we can anticipate a call from [place] with [facility] to coordinate care.

## 2020-10-02 ENCOUNTER — Telehealth: Payer: Self-pay | Admitting: Internal Medicine

## 2020-10-02 ENCOUNTER — Telehealth: Payer: Self-pay | Admitting: *Deleted

## 2020-10-02 NOTE — Telephone Encounter (Signed)
Released records to [employer] [place] [facility] to [phone]     Release:  [id number]

## 2020-10-02 NOTE — Telephone Encounter (Signed)
Anderson Malta with [facility] called requesting records to determine if we could approval for visit to our office.  Records were faxed by HIM to [phone].  Follow up calls back to [place] can be reached at [phone].  Once appt is scheduled  No family members can be aware of the visit and the date and time.

## 2020-10-04 NOTE — Telephone Encounter (Signed)
Attempted numerous times to fax records plus pathology report to [facility] detention center with failure on each attempt.  Scanned and emailed securely to Jennifer [email or web address] .[employer]

## 2020-10-23 ENCOUNTER — Inpatient Hospital Stay: Payer: Self-pay | Attending: Internal Medicine | Admitting: Internal Medicine

## 2020-10-23 ENCOUNTER — Other Ambulatory Visit: Payer: Self-pay

## 2020-10-23 VITALS — BP 101/62 | HR 85 | Temp 97.1°F | Resp 20 | Wt 102.4 lb

## 2020-10-23 DIAGNOSIS — G9608 Other cranial cerebrospinal fluid leak: Secondary | ICD-10-CM | POA: Insufficient documentation

## 2020-10-23 DIAGNOSIS — F4310 Post-traumatic stress disorder, unspecified: Secondary | ICD-10-CM | POA: Insufficient documentation

## 2020-10-23 DIAGNOSIS — Z79899 Other long term (current) drug therapy: Secondary | ICD-10-CM | POA: Insufficient documentation

## 2020-10-23 DIAGNOSIS — F17210 Nicotine dependence, cigarettes, uncomplicated: Secondary | ICD-10-CM | POA: Insufficient documentation

## 2020-10-23 DIAGNOSIS — C719 Malignant neoplasm of brain, unspecified: Secondary | ICD-10-CM

## 2020-10-23 DIAGNOSIS — R569 Unspecified convulsions: Secondary | ICD-10-CM

## 2020-10-23 DIAGNOSIS — C712 Malignant neoplasm of temporal lobe: Secondary | ICD-10-CM | POA: Insufficient documentation

## 2020-10-23 NOTE — Progress Notes (Signed)
[facility] at [place], [place] [address] [phone]   New Patient Evaluation  Date of Service: 10/23/20 Patient Name: Sonya Prince Patient MRN: [id number] Patient DOB: Dec 29, 1990 Provider: Ventura Sellers, MD  Identifying Statement:  Sonya Prince is a 29 y.o. 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 Marcello Moores  Biomarkers:  MGMT Unknown .  IDH 1/2 Unknown.  EGFR Unknown  TERT Unknown   Interval History:  Sonya Prince presents today for post-surgical follow up, although three months have passed since operation.  She is currently in jail and is accompanied by guards today.  She denies any new weakness, numbness, progression or recurrence of deficits.  There have been no further seizure events that we know of.  Further aspects of history are unclear, she is relatively uncooperative with examiner today.  H+P. Patient presented to medical attention with a seizure or series of seizures associated with loss of consciousness.  Further details of the seizure events are not well described or recalled.  There was also a seizure event in 2020 and another going back to 2019, per patient and records.  CNS imaging demonstrated a large, enhancing left temporal mass.  Associated calcifications had been noted going back to trauma CT head eval in 2017.  At this time she is back at baseline, no recurrent seizure events.  She denies any other focal or progressive neurologic symptoms.  Currently living at home with her father.    Medications: Current Outpatient Medications on File Prior to Visit  Medication Sig Dispense Refill  . acetaminophen (TYLENOL) 500 MG tablet Take 500 mg by mouth every 6 (six) hours as needed for mild pain.     Marland Kitchen amphetamine-dextroamphetamine (ADDERALL) 20 MG tablet Take 20 mg by mouth daily.     . clonazePAM (KLONOPIN) 0.5 MG tablet Take  0.5 mg by mouth as needed for anxiety.     . diazepam (VALIUM) 5 MG tablet Take 5 mg by mouth as directed. Per surgery scheduled    . FLUoxetine (PROZAC) 20 MG capsule Take 20 mg by mouth daily.    . OXcarbazepine (TRILEPTAL) 150 MG tablet Take 1 tablet (150 mg total) by mouth 2 (two) times daily. 90 tablet 0  . OXcarbazepine (TRILEPTAL) 150 MG tablet Take 1 tablet (150 mg total) by mouth 2 (two) times daily. 50 tablet 2   No current facility-administered medications on file prior to visit.    Allergies:  Allergies  Allergen Reactions  . Doxycycline Swelling   Past Medical History:  Past Medical History:  Diagnosis Date  . Anxiety   . Bipolar disorder [facility])    Father denies diagnosis  . Mass of left temporal lobe   . Meningitis   . PTSD (post-traumatic stress disorder)   . Seizures ([place])    Past Surgical History:  Past Surgical History:  Procedure Laterality Date  . APPLICATION OF CRANIAL NAVIGATION N/A 07/05/2020   Procedure: APPLICATION OF CRANIAL NAVIGATION;  Surgeon: Vallarie Mare, MD;  Location: [place];  Service: Neurosurgery;  Laterality: N/A;  APPLICATION OF CRANIAL NAVIGATION  . CRANIOTOMY Left 07/05/2020   Procedure: CRANIOTOMY LEFT TEMPORAL FOR TUMOR RESECTION;  Surgeon: Vallarie Mare, MD;  Location: [place];  Service: Neurosurgery;  Laterality: Left;  CRANIOTOMY LEFT TEMPORAL FOR TUMOR RESECTION  . OPEN REDUCTION INTERNAL FIXATION (ORIF) METACARPAL Right 12/19/2019   Procedure: OPEN TREATMENT OF  RIGHT 5TH METACARPAL FRACTURE;  Surgeon: Milly Jakob, MD;  Location: [place];  Service: Orthopedics;  Laterality: Right;  WITH PREOP REGIONAL BLOCK  . OPERATIVE ULTRASOUND Left 07/05/2020   Procedure: OPERATIVE ULTRASOUND;  Surgeon: Vallarie Mare, MD;  Location: [place];  Service: Neurosurgery;  Laterality: Left;  . WISDOM TOOTH EXTRACTION     Social History:  Social History   Socioeconomic History  . Marital status: Single    Spouse name: Not on  file  . Number of children: Not on file  . Years of education: Not on file  . Highest education level: Not on file  Occupational History  . Not on file  Tobacco Use  . Smoking status: Current Every Day Smoker    Packs/day: 0.50  . Smokeless tobacco: Former Network engineer  . Vaping Use: Every day  Substance and Sexual Activity  . Alcohol use: Yes    Comment: 3 times a month   . Drug use: Yes    Types: Marijuana  . Sexual activity: Yes  Other Topics Concern  . Not on file  Social History Narrative  . Not on file   Social Determinants of Health   Financial Resource Strain:   . Difficulty of Paying Living Expenses: Not on file  Food Insecurity:   . Worried About Charity fundraiser in the Last Year: Not on file  . Ran Out of Food in the Last Year: Not on file  Transportation Needs:   . Lack of Transportation (Medical): Not on file  . Lack of Transportation (Non-Medical): Not on file  Physical Activity:   . Days of Exercise per Week: Not on file  . Minutes of Exercise per Session: Not on file  Stress:   . Feeling of Stress : Not on file  Social Connections:   . Frequency of Communication with Friends and Family: Not on file  . Frequency of Social Gatherings with Friends and Family: Not on file  . Attends Religious Services: Not on file  . Active Member of Clubs or Organizations: Not on file  . Attends Archivist Meetings: Not on file  . Marital Status: Not on file  Intimate Partner Violence:   . Fear of Current or Ex-Partner: Not on file  . Emotionally Abused: Not on file  . Physically Abused: Not on file  . Sexually Abused: Not on file   Family History:  Family History  Family history unknown: Yes    Review of Systems: Constitutional: Doesn't report fevers, chills or abnormal weight loss Eyes: Doesn't report blurriness of vision Ears, nose, mouth, throat, and face: Doesn't report sore throat Respiratory: Doesn't report cough, dyspnea or  wheezes Cardiovascular: Doesn't report palpitation, chest discomfort  Gastrointestinal:  Doesn't report nausea, constipation, diarrhea GU: Doesn't report incontinence Skin: Doesn't report skin rashes Neurological: Per HPI Musculoskeletal: Doesn't report joint pain Behavioral/Psych: Doesn't report anxiety  Physical Exam: Vitals:   10/23/20 0944  BP: 101/62  Pulse: 85  Resp: 20  Temp: (!) 97.1 F (36.2 C)  SpO2: 100%   KPS: 90. General: Alert, cooperative, pleasant, in no acute distress Head: Normal EENT: No conjunctival injection or scleral icterus.  Lungs: Resp effort normal Cardiac: Regular rate Abdomen: Non-distended abdomen Skin: No rashes cyanosis or petechiae. Extremities: No clubbing or edema  Neurologic Exam: Mental Status: Awake, alert, attentive to examiner. Oriented to self and environment. Language is fluent with intact comprehension.  Cranial Nerves: Visual acuity is grossly normal. Visual fields  are full. Extra-ocular movements intact. No ptosis. Face is symmetric Motor: Tone and bulk are normal. Power is full in both arms and legs. Reflexes are symmetric, no pathologic reflexes present.  Sensory: Intact to light touch Gait: Normal.   Labs: I have reviewed the data as listed    Component Value Date/Time   NA 143 07/06/2020 0500   K 3.6 07/06/2020 0500   CL 111 07/06/2020 0500   CO2 18 (L) 07/06/2020 0500   GLUCOSE 135 (H) 07/06/2020 0500   BUN 6 07/06/2020 0500   CREATININE 0.83 07/06/2020 0500   CALCIUM 8.6 (L) 07/06/2020 0500   PROT 6.0 (L) 05/22/2020 0532   ALBUMIN 3.4 (L) 05/22/2020 0532   AST 28 05/22/2020 0532   ALT 15 05/22/2020 0532   ALKPHOS 42 05/22/2020 0532   BILITOT 0.7 05/22/2020 0532   GFRNONAA >60 07/06/2020 0500   GFRAA >60 07/06/2020 0500   Lab Results  Component Value Date   WBC 24.7 (H) 07/06/2020   NEUTROABS 5.7 05/22/2020   HGB 11.9 (L) 07/06/2020   HCT 36.7 07/06/2020   MCV 97.1 07/06/2020   PLT 198 07/06/2020     Imaging:  CLINICAL DATA:  Brain tumor.  Resection today.  EXAM: MRI HEAD WITHOUT AND WITH CONTRAST  TECHNIQUE: Multiplanar, multiecho pulse sequences of the brain and surrounding structures were obtained without and with intravenous contrast.  CONTRAST:  20m GADAVIST GADOBUTROL 1 MMOL/ML IV SOLN  COMPARISON:  MRI head 06/21/2020  FINDINGS: Brain: Left parietal craniotomy for tumor resection. Resection cavity containing fluid and blood products. Prior to contrast infusion, there is T1 hyperintensity along the margins of the tumor most consistent with methemoglobin which is somewhat unusual given the recent surgery earlier today. No enhancing tumor is identified. The tumor showed intense enhancement prior to resection. No significant acute perioperative infarct identified.  There are bilateral subdural effusions present. Extra-axial fluid is present at the surgical craniotomy site. This measures approximately 5 mm. There is a mild amount of subarachnoid gas over the convexity and in the frontal lobes bilaterally. No other mass lesion identified. Ventricle size normal.  Vascular: Normal arterial flow voids.  Skull and upper cervical spine: Left parietal craniotomy.  Sinuses/Orbits: Mild mucosal edema paranasal sinuses. Mastoid clear bilaterally. Normal orbit  Other: None  IMPRESSION: Apparent total resection of left parietal mass lesion. No residual enhancing mass identified.  Ventricle size normal. No midline shift. Small subdural hygroma bilaterally with pneumocephalus.   Electronically Signed   By: CFranchot GalloM.D.   On: 07/05/2020 17:00   Pathology: SURGICAL PATHOLOGY  CASE: [id number]  PATIENT: [facility] Surgical Pathology Report   Clinical History: brain tumor (cm)   FINAL MICROSCOPIC DIAGNOSIS:   A. BRAIN TUMOR, LEFT TEMPORAL LOBE, BIOPSY:  - Findings consistent with astroblastoma, see comment.   B. BRAIN TUMOR, LEFT  TEMPORAL LOBE, RESECTION:  - Findings consistent with astroblastoma, see comment.   COMMENT:   The tumor was [facility] for outside [facility] His  interpretation is a glial neoplasm consistent with astroblastoma. Per  their report chromosomal microarray testing is pending and will be  reported in an addendum. While generally not graded, they suggest this  is a well-differentiated tumor. Please see scanned report in EPIC for  full details. Dr. TMarcello Mooreswas notified of the final diagnosis on  07/23/2020.   Assessment/Plan Astroblastoma ([place] [C71.9]  We appreciate the opportunity to participate in the care of Sonya Prince  She is fortunately  clinically stable following presumed gross total resection of astroblastoma type glioma.    We had an extensive conversation with her regarding pathology, prognosis, and available treatment pathways for this very rare tumor.    We ultimately recommended proceeding with careful monitoring with serial imaging.  Recommend obtaining next MRI brain in the next month, given time since surgery.  She is agreeable to this plan.    Should continue on Trileptal 111m BID for seizure prevention.  She will follow up after MRI is obtained.  All questions were answered. The patient knows to call the clinic with any problems, questions or concerns. No barriers to learning were detected.  The total time spent in the encounter was 40 minutes and more than 50% was on counseling and review of test results   ZVentura Sellers MD Medical Director of Neuro-Oncology [facility]11/23/21 3:04 PM

## 2020-10-23 NOTE — Progress Notes (Signed)
Patient arrived from [facility] with 2 officers present.  Patient arrived without any documentation of medications taking while in custody.  [place] [employer] left message for Sherrie Sport, [place] to return call to provide those details.

## 2020-11-05 ENCOUNTER — Telehealth: Payer: Self-pay | Admitting: Internal Medicine

## 2020-11-05 NOTE — Telephone Encounter (Signed)
Release: [phone] Faxed medical records to [facility] @ [phone]

## 2020-11-06 ENCOUNTER — Telehealth: Payer: Self-pay | Admitting: *Deleted

## 2020-11-06 NOTE — Telephone Encounter (Signed)
Received call from patients mother asking for a letter advising why the patient needs a MRI to provide to the [facility] to try to get Tyeisha a MRI and then followup.    Advised mom that I am working on getting Valma the appropriate appointments scheduled directly with the [facility] [place].  Pending call back from [facility] to schedule MRI and follow ups.

## 2020-11-13 ENCOUNTER — Inpatient Hospital Stay: Payer: Self-pay | Admitting: Internal Medicine

## 2020-11-19 ENCOUNTER — Other Ambulatory Visit: Payer: Self-pay | Admitting: Radiation Therapy

## 2020-11-19 ENCOUNTER — Telehealth: Payer: Self-pay | Admitting: *Deleted

## 2020-11-19 ENCOUNTER — Ambulatory Visit (HOSPITAL_COMMUNITY)
Admission: RE | Admit: 2020-11-19 | Discharge: 2020-11-19 | Disposition: A | Discharge status: Home / Self Care | Payer: Self-pay | Source: Ambulatory Visit | Attending: Internal Medicine | Admitting: Internal Medicine

## 2020-11-19 NOTE — Telephone Encounter (Signed)
Spoke with Sherrie Sport at [facility] to schedule MRI follow up.  She stated that today when they were to bring the patient to her MRI she refused.  No explaination given.  Questioned the nurse if there were concern for claustrophia and she stated that they previously questioned that and patient denied.  Their nursing staff have done education after her refusal but patient is still refusing.   Unsure how to proceed.  Routed to Dr Mickeal Skinner to advise next steps.

## 2020-11-19 NOTE — Progress Notes (Signed)
Patient was a no call no show for her MRi appointment this morning. Order will be placed back into ancillary orders.

## 2021-04-15 ENCOUNTER — Ambulatory Visit (HOSPITAL_COMMUNITY): Admission: RE | Admit: 2021-04-15 | Payer: Self-pay | Source: Ambulatory Visit

## 2021-04-22 ENCOUNTER — Encounter: Payer: Self-pay | Admitting: Internal Medicine

## 2021-04-23 ENCOUNTER — Other Ambulatory Visit: Payer: Self-pay | Admitting: Internal Medicine

## 2021-04-23 DIAGNOSIS — C719 Malignant neoplasm of brain, unspecified: Secondary | ICD-10-CM

## 2021-05-01 ENCOUNTER — Telehealth: Payer: Self-pay | Admitting: *Deleted

## 2021-05-01 NOTE — Telephone Encounter (Signed)
Received call from Jeanella Cara, NP from [facility], where pt is an in-patient and will be there for potentially a couple of months.  Lavella Lemons was asking if pt was due for another brain MRI and if she should still be on her Trileptal. Advised that pt's last brain MRI was in august 2021 and her last visit with Dr. Mickeal Skinner was in November of 2021. Advised that she needs to have an MRI, that she was scheduled last week but was a no show.  Advised that she still needs to be on the Trileptal for seizure control.  Lavella Lemons states she will put her back on it as pt had not been taking it  Lavella Lemons states she will get the head MRI while she is an in-patient.  Pt to follow up with Dr. Mickeal Skinner at some point.

## 2021-09-03 ENCOUNTER — Telehealth: Payer: Self-pay | Admitting: Internal Medicine

## 2021-09-03 NOTE — Telephone Encounter (Signed)
Received a call from [place] at [facility] to set pt up for a f/u with Dr. Mickeal Skinner. Pt last saw Dr. Mickeal Skinner in 2021. Appt made and was told pt would be informed.

## 2021-09-12 ENCOUNTER — Ambulatory Visit: Payer: Self-pay | Admitting: Internal Medicine

## 2021-09-16 ENCOUNTER — Telehealth: Payer: Self-pay | Admitting: Internal Medicine

## 2021-09-16 NOTE — Telephone Encounter (Signed)
Sonya Prince had called and left a vm to r/s pt's appt with Dr. Mickeal Skinner. Called Ms. Prince, no answer. Left msg for her to call me back directly to r/s appt.

## 2022-09-04 ENCOUNTER — Other Ambulatory Visit: Payer: Self-pay | Admitting: Internal Medicine

## 2022-09-04 ENCOUNTER — Telehealth: Payer: Self-pay | Admitting: *Deleted

## 2022-09-04 DIAGNOSIS — C719 Malignant neoplasm of brain, unspecified: Secondary | ICD-10-CM

## 2022-09-04 NOTE — Telephone Encounter (Signed)
Received call from patients mother.  She advised that patient was hospitalized at [facility] in [place] and was discharged after rehabilitated.  She is living independently now and in contact with family.  She states that patient is having some difficulty with occasional dizziness, difficulty concentrating, short term memory loss.  She knows that patient didn't follow up with last MRI and wants to know if they can reestablish with Dr Mickeal Skinner.    If so we need order for MRI.  Routing to Dr Mickeal Skinner.

## 2022-09-04 NOTE — Telephone Encounter (Signed)
Spoke with patients mother.  She will schedule MRI and then call back to set up MD visit.  Advised we have no insurance on file so if she does have any we would also need to add that.

## 2022-09-08 ENCOUNTER — Other Ambulatory Visit: Payer: Self-pay | Admitting: Radiation Therapy

## 2022-09-15 ENCOUNTER — Encounter (HOSPITAL_COMMUNITY): Payer: Self-pay

## 2022-09-15 ENCOUNTER — Ambulatory Visit (HOSPITAL_COMMUNITY)
Admission: RE | Admit: 2022-09-15 | Discharge: 2022-09-15 | Disposition: A | Discharge status: Home / Self Care | Payer: Commercial Managed Care - HMO | Source: Ambulatory Visit | Attending: Internal Medicine | Admitting: Internal Medicine

## 2022-09-15 DIAGNOSIS — C719 Malignant neoplasm of brain, unspecified: Secondary | ICD-10-CM

## 2022-09-17 ENCOUNTER — Telehealth: Payer: Self-pay

## 2022-09-17 MED ORDER — LORAZEPAM 1 MG PO TABS: 1.0000 mg | ORAL_TABLET | Freq: Once | ORAL | 0 refills | Status: DC | PRN

## 2022-09-17 NOTE — Addendum Note (Signed)
Addended by: Ventura Sellers on: 09/17/2022 01:26 PM   Modules accepted: Orders

## 2022-09-17 NOTE — Telephone Encounter (Signed)
T/C from pt's mother, Webb Silversmith, stating pt could not go through with her MRI due to her increased anxiety.  It has been rescheduled for 10/23.    Mother is asking if we can send in something for anxiety to get her through the MRI.  She requested [place].  Also if possible can she have an appt for an open MRI  Please advise

## 2022-09-22 ENCOUNTER — Ambulatory Visit (HOSPITAL_COMMUNITY): Admission: RE | Admit: 2022-09-22 | Payer: Self-pay | Source: Ambulatory Visit

## 2022-09-22 ENCOUNTER — Ambulatory Visit: Payer: Self-pay

## 2022-09-22 ENCOUNTER — Inpatient Hospital Stay: Payer: Self-pay

## 2022-09-23 ENCOUNTER — Inpatient Hospital Stay: Payer: Self-pay | Attending: Internal Medicine | Admitting: Internal Medicine

## 2022-09-29 ENCOUNTER — Inpatient Hospital Stay: Payer: Self-pay

## 2023-03-09 ENCOUNTER — Emergency Department (HOSPITAL_BASED_OUTPATIENT_CLINIC_OR_DEPARTMENT_OTHER): Payer: Self-pay

## 2023-03-09 ENCOUNTER — Other Ambulatory Visit: Payer: Self-pay

## 2023-03-09 ENCOUNTER — Emergency Department (HOSPITAL_BASED_OUTPATIENT_CLINIC_OR_DEPARTMENT_OTHER)
Admission: EM | Admit: 2023-03-09 | Discharge: 2023-03-10 | Disposition: A | Discharge status: Home / Self Care | Payer: Self-pay | Attending: Emergency Medicine | Admitting: Emergency Medicine

## 2023-03-09 DIAGNOSIS — B9689 Other specified bacterial agents as the cause of diseases classified elsewhere: Secondary | ICD-10-CM

## 2023-03-09 DIAGNOSIS — H5789 Other specified disorders of eye and adnexa: Secondary | ICD-10-CM | POA: Insufficient documentation

## 2023-03-09 DIAGNOSIS — R2231 Localized swelling, mass and lump, right upper limb: Secondary | ICD-10-CM | POA: Insufficient documentation

## 2023-03-09 DIAGNOSIS — H1033 Unspecified acute conjunctivitis, bilateral: Secondary | ICD-10-CM

## 2023-03-09 DIAGNOSIS — L03011 Cellulitis of right finger: Secondary | ICD-10-CM

## 2023-03-09 DIAGNOSIS — N898 Other specified noninflammatory disorders of vagina: Secondary | ICD-10-CM | POA: Insufficient documentation

## 2023-03-09 LAB — WET PREP, GENITAL
Sperm: NONE SEEN
Trich, Wet Prep: NONE SEEN
WBC, Wet Prep HPF POC: <10 (ref <10)
Yeast Wet Prep HPF POC: NONE SEEN

## 2023-03-09 LAB — URINALYSIS, ROUTINE W REFLEX MICROSCOPIC
Bilirubin Urine: NEGATIVE
Glucose, UA: NEGATIVE mg/dL
Hgb urine dipstick: NEGATIVE
Ketones, ur: NEGATIVE mg/dL
Leukocytes,Ua: NEGATIVE
Nitrite: NEGATIVE
Protein, ur: NEGATIVE mg/dL
Specific Gravity, Urine: 1.024 (ref 1.005–1.030)
pH: 7.5 (ref 5.0–8.0)

## 2023-03-09 LAB — PREGNANCY, URINE: Preg Test, Ur: NEGATIVE

## 2023-03-09 MED ORDER — AZITHROMYCIN 250 MG PO TABS
1000.0000 mg | ORAL_TABLET | Freq: Once | ORAL | Status: AC
  Administered 2023-03-09: 1000 mg via ORAL
  Filled 2023-03-09: qty 4

## 2023-03-09 MED ORDER — CEFTRIAXONE SODIUM 500 MG IJ SOLR
500.0000 mg | Freq: Once | INTRAMUSCULAR | Status: AC
  Administered 2023-03-09: 500 mg via INTRAMUSCULAR
  Filled 2023-03-09: qty 500

## 2023-03-09 MED ORDER — LIDOCAINE HCL (PF) 1 % IJ SOLN
INTRAMUSCULAR | Status: AC
  Filled 2023-03-09: qty 5

## 2023-03-09 NOTE — ED Provider Notes (Signed)
[place] EMERGENCY DEPARTMENT AT [facility] Provider Note   CSN: [id number] Arrival date & time: 03/09/23  1840     History  Chief Complaint  Patient presents with   Finger Injury    Vaginal discomfort, draining eyes     Sonya Prince is a 32 y.o. female.  Patient is a 32 year old female with history of bipolar disorder, seizures.  Patient presenting today for evaluation of multiple complaints.  Patient describes vaginal burning and discharge for the past several days.  She tells me that she recently had sexual activity with her ex-boyfriend who she suspects has been sexually active with prostitutes.  She also complains of swelling and pain to her right cuticle.  This has been draining yellow material.  She denies any injury or trauma.  Patient also complains of yellow drainage from both eyes.  She has been taking allergy medication with no relief.  She denies any visual disturbances.  The history is provided by the patient.       Home Medications Prior to Admission medications   Medication Sig Start Date End Date Taking? Authorizing Provider  acetaminophen (TYLENOL) 500 MG tablet Take 500 mg by mouth every 6 (six) hours as needed for mild pain.     [provider]  amphetamine-dextroamphetamine (ADDERALL) 20 MG tablet Take 20 mg by mouth daily.     [provider]  clonazePAM (KLONOPIN) 0.5 MG tablet Take 0.5 mg by mouth as needed for anxiety.     [provider]  diazepam (VALIUM) 5 MG tablet Take 5 mg by mouth as directed. Per surgery scheduled    [provider]  FLUoxetine (PROZAC) 20 MG capsule Take 20 mg by mouth daily.    [provider]  LORazepam (ATIVAN) 1 MG tablet Take 1 tablet (1 mg total) by mouth once as needed for up to 1 dose for anxiety (mri sedation). 09/17/22   Vaslow, Georgeanna Lea, MD  OXcarbazepine (TRILEPTAL) 150 MG tablet Take 1 tablet (150 mg total) by mouth 2 (two) times daily. 05/22/20 07/06/20  Lorin Glass, MD  OXcarbazepine (TRILEPTAL) 150 MG tablet Take 1 tablet (150 mg total) by mouth 2 (two) times daily. 07/06/20   Bedelia Person, MD      Allergies    Doxycycline    Review of Systems   Review of Systems  All other systems reviewed and are negative.   Physical Exam Updated Vital Signs BP (!) 131/91   Pulse 84   Temp 97.6 F (36.4 C) (Oral)   Resp 17   Ht 5\' 2"  (1.575 m)   LMP 03/02/2023 (Exact Date)   SpO2 100%   BMI 18.73 kg/m  Physical Exam Vitals and nursing note reviewed.  Constitutional:      General: She is not in acute distress.    Appearance: She is well-developed. She is not diaphoretic.  HENT:     Head: Normocephalic and atraumatic.  Eyes:     Extraocular Movements: Extraocular movements intact.     Pupils: Pupils are equal, round, and reactive to light.     Comments: Bilateral conjunctiva are injected with some matting to the eyelashes.  Cardiovascular:     Rate and Rhythm: Normal rate and regular rhythm.     Heart sounds: No murmur heard.    No friction rub. No gallop.  Pulmonary:     Effort: Pulmonary effort is normal. No respiratory distress.     Breath sounds: Normal breath sounds. No wheezing.  Abdominal:     General: Bowel sounds are normal. There is no distension.     Palpations: Abdomen is soft.     Tenderness: There is no abdominal tenderness.  Musculoskeletal:        General: Normal range of motion.     Cervical back: Normal range of motion and neck supple.  Skin:    General: Skin is warm and dry.  Neurological:     General: No focal deficit present.     Mental Status: She is alert and oriented to person, place, and time.     ED Results / Procedures / Treatments   Labs (all labs ordered are listed, but only abnormal results are displayed) Labs Reviewed  WET PREP, GENITAL  URINALYSIS, ROUTINE W REFLEX MICROSCOPIC  PREGNANCY, URINE  GC/CHLAMYDIA PROBE AMP ([place]) NOT AT [facility]    EKG None  Radiology DG Finger  Index Right  Result Date: 03/09/2023 CLINICAL DATA:  Swelling in the finger of the right hand EXAM: RIGHT INDEX FINGER 2+V COMPARISON:  Radiographs 12/17/2019 FINDINGS: There is no evidence of fracture or dislocation. There is no evidence of arthropathy or other focal bone abnormality. Soft tissues are unremarkable. IMPRESSION: Negative. Electronically Signed   By: Minerva Fester M.D.   On: 03/09/2023 20:05    Procedures Procedures    Medications Ordered in ED Medications - No data to display  ED Course/ Medical Decision Making/ A&P  Patient presenting with multiple complaints as described in the HPI.  Her primary complaint is vaginal burning and discharge and concern for STD.  She has slight whitish discharge on exam, but no other abnormal findings.  Wet prep positive for clue cells, but otherwise unremarkable.  Patient to be treated for BV with Flagyl.  She was also treated presumptively for STD at her request with Rocephin and Zithromax pending culture.  She also has what appears to be a paronychia to the finger that is draining.  I will have her continue warm soaks.  I feel as though the Flagyl is a reasonable antibiotic for this as well.  Patient also complaining of eye drainage.  This may well be allergies, but will treat with tobramycin eyedrops in case she has conjunctivitis.  Final Clinical Impression(s) / ED Diagnoses Final diagnoses:  None    Rx / DC Orders ED Discharge Orders     None         Geoffery Lyons, MD 03/10/23 0000

## 2023-03-09 NOTE — ED Triage Notes (Signed)
Patient states she is having vaginal discomfort, odor, yellow discharge. Patient also has pain and swelling in first finger of right hand and states her eyes are draining yellow stuff and allergy medication is not helping.

## 2023-03-10 MED ORDER — TOBRAMYCIN 0.3 % OP SOLN
2.0000 [drp] | OPHTHALMIC | Status: DC
  Administered 2023-03-10: 2 [drp] via OPHTHALMIC
  Filled 2023-03-10: qty 5

## 2023-03-10 NOTE — Discharge Instructions (Signed)
Apply warm compresses to the finger as frequently as possible for the next several days.  Begin taking Flagyl as prescribed.  Begin using tobramycin eyedrops, 2 drops 4 times daily for the next several days.  Follow-up with primary doctor if symptoms or not improving in the next few days.

## 2023-03-11 LAB — GC/CHLAMYDIA PROBE AMP (~~LOC~~) NOT AT ARMC
Chlamydia: NEGATIVE
Comment: NEGATIVE
Comment: NORMAL
Neisseria Gonorrhea: NEGATIVE

## 2023-06-11 ENCOUNTER — Other Ambulatory Visit: Payer: Self-pay

## 2023-06-11 ENCOUNTER — Emergency Department (HOSPITAL_BASED_OUTPATIENT_CLINIC_OR_DEPARTMENT_OTHER): Payer: Self-pay | Admitting: Radiology

## 2023-06-11 ENCOUNTER — Emergency Department (HOSPITAL_BASED_OUTPATIENT_CLINIC_OR_DEPARTMENT_OTHER)
Admission: EM | Admit: 2023-06-11 | Discharge: 2023-06-11 | Disposition: A | Discharge status: Home / Self Care | Payer: Self-pay | Attending: Emergency Medicine | Admitting: Emergency Medicine

## 2023-06-11 ENCOUNTER — Encounter (HOSPITAL_BASED_OUTPATIENT_CLINIC_OR_DEPARTMENT_OTHER): Payer: Self-pay | Admitting: Emergency Medicine

## 2023-06-11 DIAGNOSIS — R300 Dysuria: Secondary | ICD-10-CM | POA: Insufficient documentation

## 2023-06-11 DIAGNOSIS — B9789 Other viral agents as the cause of diseases classified elsewhere: Secondary | ICD-10-CM | POA: Insufficient documentation

## 2023-06-11 DIAGNOSIS — J069 Acute upper respiratory infection, unspecified: Secondary | ICD-10-CM | POA: Insufficient documentation

## 2023-06-11 LAB — URINALYSIS, ROUTINE W REFLEX MICROSCOPIC
Bilirubin Urine: NEGATIVE
Glucose, UA: NEGATIVE mg/dL
Hgb urine dipstick: NEGATIVE
Ketones, ur: NEGATIVE mg/dL
Leukocytes,Ua: NEGATIVE
Nitrite: NEGATIVE
Protein, ur: NEGATIVE mg/dL
Specific Gravity, Urine: 1.018 (ref 1.005–1.030)
pH: 7.5 (ref 5.0–8.0)

## 2023-06-11 NOTE — ED Triage Notes (Signed)
Pt arrives to ED with c/o cough x3 weeks and dysuria.

## 2023-06-11 NOTE — ED Notes (Signed)
Pt from home with cough x 3 weeks that is remaining the same/getting a little better. Pt reports no pcp; states her friend who got sick at the same time was diagnosed with bronchitis. Denies sob except when exercising. Cough is productive. NAD noted.

## 2023-06-11 NOTE — Discharge Instructions (Signed)
It was a pleasure taking care of you!   Your chest xray didn't show any concerning emergent findings. You urine was without concerning emergent findings today. You may take over-the-counter cough and cold medications as needed for your symptoms. Ensure to maintain fluid intake with tea, soup, broth, Pedialyte, Gatorade, water.  You may follow-up with your primary care provider as needed.  Return to the Emergency Department if you are experiencing increasing/worsening symptoms.

## 2023-06-11 NOTE — ED Notes (Signed)
DC by charge [employer].

## 2023-06-11 NOTE — ED Provider Notes (Signed)
[place] EMERGENCY DEPARTMENT AT [facility] Provider Note   CSN: [id number] Arrival date & time: 06/11/23  4098     History  Chief Complaint  Patient presents with   Dysuria   Cough    Sonya Prince is a 32 y.o. female who presents to the ED with concerns for dysuria onset today. Has associated cough x several weeks. Has sick contacts with similar symptoms. No meds tried PTA. Denies chest pain, shortness of breath, vaginal bleeding/discharge, abdominal pain, nausea, vomiting, diarrhea, constipation.   The history is provided by the patient. No language interpreter was used.       Home Medications Prior to Admission medications   Medication Sig Start Date End Date Taking? Authorizing Provider  acetaminophen (TYLENOL) 500 MG tablet Take 500 mg by mouth every 6 (six) hours as needed for mild pain.     [provider]  amphetamine-dextroamphetamine (ADDERALL) 20 MG tablet Take 20 mg by mouth daily.     [provider]  clonazePAM (KLONOPIN) 0.5 MG tablet Take 0.5 mg by mouth as needed for anxiety.     [provider]  diazepam (VALIUM) 5 MG tablet Take 5 mg by mouth as directed. Per surgery scheduled    [provider]  FLUoxetine (PROZAC) 20 MG capsule Take 20 mg by mouth daily.    [provider]  LORazepam (ATIVAN) 1 MG tablet Take 1 tablet (1 mg total) by mouth once as needed for up to 1 dose for anxiety (mri sedation). 09/17/22   Vaslow, Georgeanna Lea, MD  OXcarbazepine (TRILEPTAL) 150 MG tablet Take 1 tablet (150 mg total) by mouth 2 (two) times daily. 05/22/20 07/06/20  Lorin Glass, MD  OXcarbazepine (TRILEPTAL) 150 MG tablet Take 1 tablet (150 mg total) by mouth 2 (two) times daily. 07/06/20   Bedelia Person, MD      Allergies    Doxycycline    Review of Systems   Review of Systems  Respiratory:  Positive for cough.   Genitourinary:  Positive for dysuria.  All other systems reviewed and are negative.   Physical  Exam Updated Vital Signs BP 100/64 (BP Location: Right Arm)   Pulse 85   Temp 98.4 F (36.9 C) (Temporal)   Resp 18   Ht 5\' 2"  (1.575 m)   SpO2 99%   BMI 18.73 kg/m  Physical Exam Vitals and nursing note reviewed.  Constitutional:      General: She is not in acute distress.    Appearance: She is not diaphoretic.  HENT:     Head: Normocephalic and atraumatic.     Mouth/Throat:     Pharynx: No oropharyngeal exudate.  Eyes:     General: No scleral icterus.    Conjunctiva/sclera: Conjunctivae normal.  Cardiovascular:     Rate and Rhythm: Normal rate and regular rhythm.     Pulses: Normal pulses.     Heart sounds: Normal heart sounds.  Pulmonary:     Effort: Pulmonary effort is normal. No respiratory distress.     Breath sounds: Normal breath sounds. No wheezing.     Comments: Able to speak in clear complete sentences.  Abdominal:     General: Bowel sounds are normal.     Palpations: Abdomen is soft. There is no mass.     Tenderness: There is no abdominal tenderness. There is no guarding or rebound.  Musculoskeletal:        General: Normal range of motion.     Cervical back:  Normal range of motion and neck supple.  Skin:    General: Skin is warm and dry.  Neurological:     Mental Status: She is alert.  Psychiatric:        Behavior: Behavior normal.     ED Results / Procedures / Treatments   Labs (all labs ordered are listed, but only abnormal results are displayed) Labs Reviewed  URINALYSIS, ROUTINE W REFLEX MICROSCOPIC    EKG None  Radiology DG Chest 2 View  Result Date: 06/11/2023 CLINICAL DATA:  Cough for 3 weeks. EXAM: CHEST - 2 VIEW COMPARISON:  Chest radiograph dated July 05, 2020 FINDINGS: The heart size and mediastinal contours are within normal limits. Both lungs are clear. The visualized skeletal structures are unremarkable. IMPRESSION: No active cardiopulmonary disease. Electronically Signed   By: Larose Hires D.O.   On: 06/11/2023 11:38     Procedures Procedures    Medications Ordered in ED Medications - No data to display  ED Course/ Medical Decision Making/ A&P Clinical Course as of 06/11/23 1200  Thu Jun 11, 2023  1033 Offered respiratory panel, patient declines today. [SB]  1151 Re-evaluated and discussed with patient lab and imaging findings. Discussed discharge treatment plan. Pt agreeable at this time. Pt appears safe for discharge.  [SB]    Clinical Course User Index [SB] Jaydence Vanyo A, PA-C                             Medical Decision Making Amount and/or Complexity of Data Reviewed Labs: ordered. Radiology: ordered.   Pt presents with concerns for cough onset 3 weeks. Sick contacts at home. Vital signs, pt afebrile. On exam, pt with no acute cardiovascular, respiratory, abdominal exam findings. Differential diagnosis includes pneumonia, COVID, Flu, RSV.    Labs:  I ordered, and personally interpreted labs.  The pertinent results include:   Urinalysis negative  Imaging: I ordered imaging studies including Chest x-ray I independently visualized and interpreted imaging which showed: no acute findings I agree with the radiologist interpretation  Disposition: Presentation suspicious for viral URI with cough. Doubt concerns at this time for pneumonia. Patient declines COVID, Flu, RSV testing at this time. After consideration of the diagnostic results and the patients response to treatment, I feel that the patient would benefit from Discharge home. Supportive care measures and strict return precautions discussed with patient at bedside. Pt acknowledges and verbalizes understanding. Pt appears safe for discharge. Follow up as indicated in discharge paperwork.    This chart was dictated using voice recognition software, Dragon. Despite the best efforts of this provider to proofread and correct errors, errors may still occur which can change documentation meaning.  Final Clinical Impression(s) / ED  Diagnoses Final diagnoses:  Viral URI with cough    Rx / DC Orders ED Discharge Orders     None         Zariel Capano A, PA-C 06/11/23 1200    Tegeler, Canary Brim, MD 06/11/23 1557

## 2023-06-18 ENCOUNTER — Emergency Department (HOSPITAL_BASED_OUTPATIENT_CLINIC_OR_DEPARTMENT_OTHER): Payer: Self-pay | Admitting: Radiology

## 2023-06-18 ENCOUNTER — Emergency Department (HOSPITAL_BASED_OUTPATIENT_CLINIC_OR_DEPARTMENT_OTHER)
Admission: EM | Admit: 2023-06-18 | Discharge: 2023-06-18 | Discharge status: Against Medical Advice | Payer: Self-pay | Attending: Emergency Medicine | Admitting: Emergency Medicine

## 2023-06-18 ENCOUNTER — Encounter (HOSPITAL_BASED_OUTPATIENT_CLINIC_OR_DEPARTMENT_OTHER): Payer: Self-pay

## 2023-06-18 ENCOUNTER — Emergency Department (HOSPITAL_BASED_OUTPATIENT_CLINIC_OR_DEPARTMENT_OTHER): Payer: Self-pay

## 2023-06-18 ENCOUNTER — Encounter (HOSPITAL_BASED_OUTPATIENT_CLINIC_OR_DEPARTMENT_OTHER): Payer: Self-pay | Admitting: Emergency Medicine

## 2023-06-18 ENCOUNTER — Other Ambulatory Visit: Payer: Self-pay

## 2023-06-18 DIAGNOSIS — S0001XA Abrasion of scalp, initial encounter: Secondary | ICD-10-CM | POA: Insufficient documentation

## 2023-06-18 DIAGNOSIS — S0083XA Contusion of other part of head, initial encounter: Secondary | ICD-10-CM | POA: Insufficient documentation

## 2023-06-18 DIAGNOSIS — S7012XA Contusion of left thigh, initial encounter: Secondary | ICD-10-CM | POA: Insufficient documentation

## 2023-06-18 DIAGNOSIS — M79604 Pain in right leg: Secondary | ICD-10-CM | POA: Insufficient documentation

## 2023-06-18 DIAGNOSIS — F1910 Other psychoactive substance abuse, uncomplicated: Secondary | ICD-10-CM

## 2023-06-18 DIAGNOSIS — S7011XA Contusion of right thigh, initial encounter: Secondary | ICD-10-CM | POA: Insufficient documentation

## 2023-06-18 DIAGNOSIS — M542 Cervicalgia: Secondary | ICD-10-CM | POA: Insufficient documentation

## 2023-06-18 DIAGNOSIS — M79605 Pain in left leg: Secondary | ICD-10-CM | POA: Insufficient documentation

## 2023-06-18 DIAGNOSIS — R0781 Pleurodynia: Secondary | ICD-10-CM | POA: Insufficient documentation

## 2023-06-18 DIAGNOSIS — S0003XA Contusion of scalp, initial encounter: Secondary | ICD-10-CM | POA: Insufficient documentation

## 2023-06-18 LAB — RAPID URINE DRUG SCREEN, HOSP PERFORMED
Amphetamines: NOT DETECTED
Barbiturates: NOT DETECTED
Benzodiazepines: NOT DETECTED
Cocaine: POSITIVE — AB
Opiates: NOT DETECTED
Tetrahydrocannabinol: POSITIVE — AB

## 2023-06-18 LAB — URINALYSIS, ROUTINE W REFLEX MICROSCOPIC
Bacteria, UA: NONE SEEN
Bilirubin Urine: NEGATIVE
Glucose, UA: NEGATIVE mg/dL
Ketones, ur: NEGATIVE mg/dL
Leukocytes,Ua: NEGATIVE
Nitrite: NEGATIVE
Protein, ur: NEGATIVE mg/dL
Specific Gravity, Urine: 1.03 (ref 1.005–1.030)
pH: 5.5 (ref 5.0–8.0)

## 2023-06-18 LAB — PREGNANCY, URINE: Preg Test, Ur: NEGATIVE

## 2023-06-18 MED ORDER — LORAZEPAM 1 MG PO TABS
1.0000 mg | ORAL_TABLET | Freq: Once | ORAL | Status: AC
  Administered 2023-06-18: 1 mg via ORAL
  Filled 2023-06-18: qty 1

## 2023-06-18 MED ORDER — NICOTINE 21 MG/24HR TD PT24
21.0000 mg | MEDICATED_PATCH | Freq: Once | TRANSDERMAL | Status: DC
  Administered 2023-06-18: 21 mg via TRANSDERMAL
  Filled 2023-06-18: qty 1

## 2023-06-18 NOTE — ED Notes (Signed)
Pt ambulatory to and from bathroom without assistance. Ginger ale and water provided by NT per pt request
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
## 2023-06-18 NOTE — ED Notes (Signed)
This RN returned to pts room, pt and belongings gone, pt presumably eloped. PA made aware
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
## 2023-06-18 NOTE — SANE Note (Signed)
ADVISED BY HEATHER STONE, RN THAT PT LEFT AMA.  THIS IS THE 3RD TIME PT HAS LEFT THIS AM.   WILL WAIT A LITTLE LONGER TO SEE IF PT RETURNS.

## 2023-06-18 NOTE — ED Notes (Signed)
Pt found ambulating in hallway with belongings. Stating "she wanted to get out of here". Pt visibly upset but a/o x4. Nursing staff and security attempted to redirect the pt. Pt stated she wanted a "minute to herself" and she was assisted outside by a ER staff member.

## 2023-06-18 NOTE — ED Notes (Signed)
Spoke with SANE nurse at [phone]. SANE nurse passed along instructions to collect a dirty urine sample, tell pt not to clean herself and to gently pat dry. Passed this information on to Valere Dross, RN

## 2023-06-18 NOTE — SANE Note (Signed)
STAFF AT BEDSIDE TO ADMINISTER MEDS, DRAW BLOOD, AND VERIFY MEDICATIONS.   PT NEEDS A LOT OF REDIRECTION.  VISITOR AT BEDSIDE.
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
## 2023-06-18 NOTE — ED Notes (Signed)
Pt resting at this time. Respirations even and unlabored.
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
## 2023-06-18 NOTE — ED Notes (Signed)
Pt returned to the unit. Given hot tea and belongings.

## 2023-06-18 NOTE — ED Notes (Signed)
Patient transported to CT 

## 2023-06-18 NOTE — ED Notes (Signed)
Pt agitated and left the unit again. Consulting civil engineer on phone with forensic nursing. Pt outside at this time. Security keeping eye on patient.
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
## 2023-07-27 ENCOUNTER — Encounter (HOSPITAL_BASED_OUTPATIENT_CLINIC_OR_DEPARTMENT_OTHER): Payer: Self-pay

## 2023-07-27 ENCOUNTER — Emergency Department (HOSPITAL_BASED_OUTPATIENT_CLINIC_OR_DEPARTMENT_OTHER): Payer: MEDICAID | Admitting: Radiology

## 2023-07-27 ENCOUNTER — Other Ambulatory Visit: Payer: Self-pay

## 2023-07-27 ENCOUNTER — Emergency Department (HOSPITAL_BASED_OUTPATIENT_CLINIC_OR_DEPARTMENT_OTHER)
Admission: EM | Admit: 2023-07-27 | Discharge: 2023-07-27 | Disposition: A | Discharge status: Home / Self Care | Payer: MEDICAID | Attending: Emergency Medicine | Admitting: Emergency Medicine

## 2023-07-27 DIAGNOSIS — X500XXA Overexertion from strenuous movement or load, initial encounter: Secondary | ICD-10-CM | POA: Insufficient documentation

## 2023-07-27 DIAGNOSIS — Y990 Civilian activity done for income or pay: Secondary | ICD-10-CM | POA: Diagnosis not present

## 2023-07-27 DIAGNOSIS — M25511 Pain in right shoulder: Secondary | ICD-10-CM | POA: Insufficient documentation

## 2023-07-27 DIAGNOSIS — G8929 Other chronic pain: Secondary | ICD-10-CM | POA: Insufficient documentation

## 2023-07-27 NOTE — ED Triage Notes (Signed)
Pt states that she has had right shoulder pain x 3 weeks that worsened today after lifting things at work.

## 2023-07-27 NOTE — ED Provider Notes (Signed)
[place] EMERGENCY DEPARTMENT AT [facility] Provider Note   CSN: [id number] Arrival date & time: 07/27/23  2050     History {Add pertinent medical, surgical, social history, OB history to HPI:1} Chief Complaint  Patient presents with   Shoulder Pain    Sonya Prince is a 32 y.o. female.   Shoulder Pain      Home Medications Prior to Admission medications   Medication Sig Start Date End Date Taking? Authorizing Provider  acetaminophen (TYLENOL) 500 MG tablet Take 500 mg by mouth every 6 (six) hours as needed for mild pain.     [provider]  amphetamine-dextroamphetamine (ADDERALL) 20 MG tablet Take 20 mg by mouth daily.     [provider]  clonazePAM (KLONOPIN) 0.5 MG tablet Take 0.5 mg by mouth as needed for anxiety.     [provider]  diazepam (VALIUM) 5 MG tablet Take 5 mg by mouth as directed. Per surgery scheduled    [provider]  FLUoxetine (PROZAC) 20 MG capsule Take 20 mg by mouth daily.    [provider]  LORazepam (ATIVAN) 1 MG tablet Take 1 tablet (1 mg total) by mouth once as needed for up to 1 dose for anxiety (mri sedation). 09/17/22   Vaslow, Georgeanna Lea, MD  OXcarbazepine (TRILEPTAL) 150 MG tablet Take 1 tablet (150 mg total) by mouth 2 (two) times daily. 05/22/20 07/06/20  Lorin Glass, MD  OXcarbazepine (TRILEPTAL) 150 MG tablet Take 1 tablet (150 mg total) by mouth 2 (two) times daily. 07/06/20   Bedelia Person, MD      Allergies    Doxycycline    Review of Systems   Review of Systems  Physical Exam Updated Vital Signs BP 135/81 (BP Location: Left Arm)   Pulse 83   Temp 98.6 F (37 C)   Resp 18   Ht 5\' 2"  (1.575 m)   Wt 53.5 kg   LMP 07/24/2023 (Exact Date)   SpO2 100%   BMI 21.57 kg/m  Physical Exam  ED Results / Procedures / Treatments   Labs (all labs ordered are listed, but only abnormal results are displayed) Labs Reviewed - No data to  display  EKG None  Radiology DG Shoulder Right  Result Date: 07/27/2023 CLINICAL DATA:  Right shoulder pain for several weeks which worsened today following heavy lifting, initial encounter EXAM: RIGHT SHOULDER - 2+ VIEW COMPARISON:  None Available. FINDINGS: There is no evidence of fracture or dislocation. There is no evidence of arthropathy or other focal bone abnormality. Soft tissues are unremarkable. IMPRESSION: No acute abnormality noted. Electronically Signed   By: Alcide Clever M.D.   On: 07/27/2023 21:34    Procedures Procedures  {Document cardiac monitor, telemetry assessment procedure when appropriate:1}  Medications Ordered in ED Medications - No data to display  ED Course/ Medical Decision Making/ A&P   {   Click here for ABCD2, HEART and other calculatorsREFRESH Note before signing :1}                              Medical Decision Making Amount and/or Complexity of Data Reviewed Radiology: ordered.   ***  {Document critical care time when appropriate:1} {Document review of labs and clinical decision tools ie heart score, Chads2Vasc2 etc:1}  {Document your independent review of radiology images, and any outside records:1} {Document your discussion with family members, caretakers, and with consultants:1} {Document social determinants of health  affecting pt's care:1} {Document your decision making why or why not admission, treatments were needed:1} Final Clinical Impression(s) / ED Diagnoses Final diagnoses:  None    Rx / DC Orders ED Discharge Orders     None

## 2023-07-27 NOTE — Discharge Instructions (Signed)
Follow up with a primary care provider.  

## 2023-12-03 ENCOUNTER — Other Ambulatory Visit: Payer: Self-pay | Admitting: Internal Medicine

## 2023-12-03 ENCOUNTER — Telehealth: Payer: Self-pay | Admitting: *Deleted

## 2023-12-03 DIAGNOSIS — C719 Malignant neoplasm of brain, unspecified: Secondary | ICD-10-CM

## 2023-12-03 NOTE — Telephone Encounter (Signed)
-----   Message from [name] sent at 12/03/2023  9:15 AM EST ----- Yes, MRI and visit ----- Message ----- From: [name] ORN, RN Sent: 12/03/2023   9:15 AM EST To: [name], MD  This patient called this morning & said she wants a visit with you.  She hasn't been here since 2021 so wanted to ask you about this first.  The patient says she recently had a injury to the head near her surgical site.  Does she need a MRI before seeing you?  How would you like to proceed?  [name]

## 2023-12-03 NOTE — Telephone Encounter (Signed)
 PC to patient, no answer, left VM - informed patient Dr Buckley would like a brain MRI before seeing her in the office.  MRI has been ordered & PA obtained, instructed patient to contact Central Scheduling, [phone], to schedule her MRI in the next couple of weeks.  We will schedule her to come in for MD appointment once we know date of MRI.  Instructed patient to contact this office with any questions/concerns, [phone].

## 2023-12-11 ENCOUNTER — Ambulatory Visit (HOSPITAL_COMMUNITY): Admission: RE | Admit: 2023-12-11 | Payer: Commercial Managed Care - HMO | Source: Ambulatory Visit

## 2023-12-11 ENCOUNTER — Encounter (HOSPITAL_COMMUNITY): Payer: Self-pay

## 2024-01-05 ENCOUNTER — Telehealth: Payer: Self-pay | Admitting: *Deleted

## 2024-01-05 NOTE — Telephone Encounter (Signed)
 PC to patient, she missed MRI scheduled on 1/10.  Patient states she will reschedule her MRI, number to [facility] given.  Informed patient our scheduling department will contact her to arrange F/U with Dr Buckley once we know her MRI date.  She verbalizes understanding.

## 2024-01-08 ENCOUNTER — Other Ambulatory Visit: Payer: Self-pay

## 2024-01-08 ENCOUNTER — Encounter (HOSPITAL_BASED_OUTPATIENT_CLINIC_OR_DEPARTMENT_OTHER): Payer: Self-pay | Admitting: Emergency Medicine

## 2024-01-08 ENCOUNTER — Emergency Department (HOSPITAL_BASED_OUTPATIENT_CLINIC_OR_DEPARTMENT_OTHER)
Admission: EM | Admit: 2024-01-08 | Discharge: 2024-01-08 | Discharge status: Against Medical Advice | Payer: Commercial Managed Care - HMO | Attending: Emergency Medicine | Admitting: Emergency Medicine

## 2024-01-08 DIAGNOSIS — Z5321 Procedure and treatment not carried out due to patient leaving prior to being seen by health care provider: Secondary | ICD-10-CM | POA: Diagnosis not present

## 2024-01-08 DIAGNOSIS — R1030 Lower abdominal pain, unspecified: Secondary | ICD-10-CM | POA: Insufficient documentation

## 2024-01-08 LAB — CBC
HCT: 39.1 % (ref 36.0–46.0)
Hemoglobin: 13.1 g/dL (ref 12.0–15.0)
MCH: 31.1 pg (ref 26.0–34.0)
MCHC: 33.5 g/dL (ref 30.0–36.0)
MCV: 92.9 fL (ref 80.0–100.0)
Platelets: 263 10*3/uL (ref 150–400)
RBC: 4.21 MIL/uL (ref 3.87–5.11)
RDW: 11.9 % (ref 11.5–15.5)
WBC: 7.2 10*3/uL (ref 4.0–10.5)
nRBC: 0 % (ref 0.0–0.2)

## 2024-01-08 LAB — COMPREHENSIVE METABOLIC PANEL
ALT: 18 U/L (ref 0–44)
AST: 17 U/L (ref 15–41)
Albumin: 4.2 g/dL (ref 3.5–5.0)
Alkaline Phosphatase: 54 U/L (ref 38–126)
Anion gap: 5 (ref 5–15)
BUN: 10 mg/dL (ref 6–20)
CO2: 28 mmol/L (ref 22–32)
Calcium: 9.3 mg/dL (ref 8.9–10.3)
Chloride: 103 mmol/L (ref 98–111)
Creatinine, Ser: 0.65 mg/dL (ref 0.44–1.00)
GFR, Estimated: >60 mL/min (ref >60)
Glucose, Bld: 89 mg/dL (ref 70–99)
Potassium: 4.3 mmol/L (ref 3.5–5.1)
Sodium: 136 mmol/L (ref 135–145)
Total Bilirubin: 0.5 mg/dL (ref 0.0–1.2)
Total Protein: 7.1 g/dL (ref 6.5–8.1)

## 2024-01-08 LAB — LIPASE, BLOOD: Lipase: 23 U/L (ref 11–51)

## 2024-01-08 NOTE — ED Triage Notes (Signed)
 C/o lower abd pain that originated on the left sided that has migrated to right side. Recent treated for BV. Denies n/v/d.

## 2024-01-11 ENCOUNTER — Other Ambulatory Visit: Payer: Self-pay

## 2024-01-11 ENCOUNTER — Emergency Department (HOSPITAL_BASED_OUTPATIENT_CLINIC_OR_DEPARTMENT_OTHER)
Admission: EM | Admit: 2024-01-11 | Discharge: 2024-01-11 | Discharge status: Against Medical Advice | Payer: Commercial Managed Care - HMO | Attending: Emergency Medicine | Admitting: Emergency Medicine

## 2024-01-11 ENCOUNTER — Encounter (HOSPITAL_BASED_OUTPATIENT_CLINIC_OR_DEPARTMENT_OTHER): Payer: Self-pay | Admitting: Emergency Medicine

## 2024-01-11 DIAGNOSIS — Z5321 Procedure and treatment not carried out due to patient leaving prior to being seen by health care provider: Secondary | ICD-10-CM | POA: Insufficient documentation

## 2024-01-11 DIAGNOSIS — R102 Pelvic and perineal pain: Secondary | ICD-10-CM | POA: Insufficient documentation

## 2024-01-11 DIAGNOSIS — M25511 Pain in right shoulder: Secondary | ICD-10-CM | POA: Diagnosis not present

## 2024-01-11 DIAGNOSIS — R1030 Lower abdominal pain, unspecified: Secondary | ICD-10-CM | POA: Insufficient documentation

## 2024-01-11 LAB — COMPREHENSIVE METABOLIC PANEL
ALT: 16 U/L (ref 0–44)
AST: 15 U/L (ref 15–41)
Albumin: 4.1 g/dL (ref 3.5–5.0)
Alkaline Phosphatase: 65 U/L (ref 38–126)
Anion gap: 6 (ref 5–15)
BUN: 14 mg/dL (ref 6–20)
CO2: 30 mmol/L (ref 22–32)
Calcium: 9.7 mg/dL (ref 8.9–10.3)
Chloride: 103 mmol/L (ref 98–111)
Creatinine, Ser: 0.53 mg/dL (ref 0.44–1.00)
GFR, Estimated: >60 mL/min (ref >60)
Glucose, Bld: 76 mg/dL (ref 70–99)
Potassium: 4.1 mmol/L (ref 3.5–5.1)
Sodium: 139 mmol/L (ref 135–145)
Total Bilirubin: 0.3 mg/dL (ref 0.0–1.2)
Total Protein: 6.9 g/dL (ref 6.5–8.1)

## 2024-01-11 LAB — CBC
HCT: 38.9 % (ref 36.0–46.0)
Hemoglobin: 13.2 g/dL (ref 12.0–15.0)
MCH: 31.9 pg (ref 26.0–34.0)
MCHC: 33.9 g/dL (ref 30.0–36.0)
MCV: 94 fL (ref 80.0–100.0)
Platelets: 241 10*3/uL (ref 150–400)
RBC: 4.14 MIL/uL (ref 3.87–5.11)
RDW: 11.9 % (ref 11.5–15.5)
WBC: 9.6 10*3/uL (ref 4.0–10.5)
nRBC: 0 % (ref 0.0–0.2)

## 2024-01-11 LAB — URINALYSIS, ROUTINE W REFLEX MICROSCOPIC
Bilirubin Urine: NEGATIVE
Glucose, UA: NEGATIVE mg/dL
Hgb urine dipstick: NEGATIVE
Ketones, ur: NEGATIVE mg/dL
Leukocytes,Ua: NEGATIVE
Nitrite: NEGATIVE
Protein, ur: NEGATIVE mg/dL
Specific Gravity, Urine: 1.009 (ref 1.005–1.030)
pH: 7 (ref 5.0–8.0)

## 2024-01-11 LAB — LIPASE, BLOOD: Lipase: 22 U/L (ref 11–51)

## 2024-01-11 LAB — PREGNANCY, URINE: Preg Test, Ur: NEGATIVE

## 2024-01-11 NOTE — ED Notes (Signed)
Pt advised registration she was leaving.  

## 2024-01-11 NOTE — ED Triage Notes (Signed)
 Lower abdo pain and into back  X weeks Constant, but sharp episodes  Right shoulder pain x 6 months  Vaginal pain just finished course of ABT for BV

## 2024-04-13 ENCOUNTER — Telehealth: Payer: Self-pay

## 2024-04-13 ENCOUNTER — Other Ambulatory Visit: Payer: Self-pay

## 2024-04-13 DIAGNOSIS — C719 Malignant neoplasm of brain, unspecified: Secondary | ICD-10-CM

## 2024-04-13 NOTE — Telephone Encounter (Signed)
 TC to pt to inform of new order for MR Brain. Advised to call Central Scheduling to set up appointment and to notify Dr. Boris Byars office when appointment will be so that f/u OV with Dr. Mark Sil can be scheduled. Pt verbalizes understanding. Pt reports she will need anxiolytic prior to test. Dr. Mark Sil informed.

## 2024-04-13 NOTE — Telephone Encounter (Signed)
-----   Message from Mamie Searles sent at 04/13/2024 11:13 AM EDT ----- Regarding: RE: MRI and f/u Yes absolutely ----- Message ----- From: Velda Gerlach, RN Sent: 04/13/2024  11:10 AM EDT To: Mamie Searles, MD Subject: MRI and f/u                                    Dr. Mark Sil,  This patient called today and said that she attempted to reschedule her brain MRI today that she missed in January, but the authorization has expired and she will need a new order. I see that she hasn't seen you in a long time, but you agreed to see her again after she got an MRI. Since this was back in January, I wanted to make sure you're still in agreement w/ MRI and f/u?   Thanks, [employer]

## 2024-04-18 ENCOUNTER — Telehealth: Payer: Self-pay | Admitting: *Deleted

## 2024-04-18 MED ORDER — LORAZEPAM 1 MG PO TABS: 1.0000 mg | ORAL_TABLET | Freq: Once | ORAL | 0 refills | Status: DC | PRN

## 2024-04-18 NOTE — Addendum Note (Signed)
 Addended by: Kandace Elrod K on: 04/18/2024 04:21 PM   Modules accepted: Orders

## 2024-04-18 NOTE — Telephone Encounter (Signed)
 Patient called requesting sedative for MRI that she has already scheduled for next week.  Pharmacy [facility] on [address]

## 2024-04-22 ENCOUNTER — Telehealth: Payer: Self-pay | Admitting: *Deleted

## 2024-04-22 NOTE — Telephone Encounter (Signed)
 Contacted patient with following information:  Per chart, Dr. Mark Sil sent RX to [facility] on 5/19 for lorazepam  to be taken prior to scan.  Patient thanked caller and verbalized understanding.

## 2024-04-22 NOTE — Telephone Encounter (Signed)
 Sonya Prince called LVM asking if there was any medication she needed to pick up before her MRI in the the AM?  Per chart, Dr. Mark Sil sent RX to [facility] on 5/19 for lorazepam  to be taken prior to scan.   Contacted patient to provide her with the above information.  No answer and Mail box full

## 2024-04-23 ENCOUNTER — Ambulatory Visit (HOSPITAL_COMMUNITY): Admission: RE | Admit: 2024-04-23 | Payer: MEDICAID | Source: Ambulatory Visit

## 2024-04-29 ENCOUNTER — Ambulatory Visit (HOSPITAL_COMMUNITY)
Admission: RE | Admit: 2024-04-29 | Discharge: 2024-04-29 | Disposition: A | Discharge status: Home / Self Care | Payer: MEDICAID | Source: Ambulatory Visit | Attending: Internal Medicine | Admitting: Internal Medicine

## 2024-04-29 DIAGNOSIS — C719 Malignant neoplasm of brain, unspecified: Secondary | ICD-10-CM | POA: Diagnosis present

## 2024-04-29 MED ORDER — GADOBUTROL 1 MMOL/ML IV SOLN
5.0000 mL | Freq: Once | INTRAVENOUS | Status: AC | PRN
  Administered 2024-04-29: 5 mL via INTRAVENOUS

## 2024-05-02 ENCOUNTER — Telehealth: Payer: Self-pay | Admitting: *Deleted

## 2024-05-02 NOTE — Telephone Encounter (Signed)
 Received PC from patient, she states she missed a call, informed her Dr Mark Sil wants to schedule an appointment to discuss her recent MRI.  Appointment scheduled for June at 9:00, patient verbalizes understanding.

## 2024-05-05 ENCOUNTER — Telehealth: Payer: Self-pay

## 2024-05-05 ENCOUNTER — Other Ambulatory Visit: Payer: Self-pay | Admitting: Radiation Therapy

## 2024-05-05 ENCOUNTER — Encounter: Payer: Self-pay | Admitting: Internal Medicine

## 2024-05-05 ENCOUNTER — Inpatient Hospital Stay: Payer: MEDICAID | Attending: Internal Medicine | Admitting: Internal Medicine

## 2024-05-05 VITALS — BP 123/91 | HR 79 | Temp 97.7°F | Resp 18 | Wt 118.0 lb

## 2024-05-05 DIAGNOSIS — Z79899 Other long term (current) drug therapy: Secondary | ICD-10-CM | POA: Insufficient documentation

## 2024-05-05 DIAGNOSIS — F17210 Nicotine dependence, cigarettes, uncomplicated: Secondary | ICD-10-CM | POA: Diagnosis not present

## 2024-05-05 DIAGNOSIS — C712 Malignant neoplasm of temporal lobe: Secondary | ICD-10-CM | POA: Diagnosis present

## 2024-05-05 DIAGNOSIS — C719 Malignant neoplasm of brain, unspecified: Secondary | ICD-10-CM | POA: Diagnosis not present

## 2024-05-05 DIAGNOSIS — R569 Unspecified convulsions: Secondary | ICD-10-CM | POA: Diagnosis not present

## 2024-05-05 NOTE — Progress Notes (Signed)
 [facility] [address]  [place], [place] [address] [phone]   Interval Evaluation  Date of Service: 05/05/24 Patient Name: Sonya Prince Patient MRN: [id number] Patient DOB: 1991/09/14 Provider: Mamie Searles, MD  Identifying Statement:  Sonya Prince is a 33 y.o. 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 Andy Bannister  Biomarkers:  MGMT Unknown .  IDH 1/2 Unknown.  MN1 Altered  Ki-67 5%   Interval History:  Sonya Prince presents today following recent MRI brain, following several years of loss to follow up.  She describes no new neurologic symptoms, MRI was obtained due to concerns following head trauma.  Overall she is doing better following incarceration, staying at home with her parents.  No reported seizures, compliance with Trileptal  has been inconsistent per patient.  Continues to follow closely with her psychiatrist.    H+P. Patient presented to medical attention with a seizure or series of seizures associated with loss of consciousness.  Further details of the seizure events are not well described or recalled.  There was also a seizure event in 2020 and another going back to 2019, per patient and records.  CNS imaging demonstrated a large, enhancing left temporal mass.  Associated calcifications had been noted going back to trauma CT head eval in 2017.  At this time she is back at baseline, no recurrent seizure events.  She denies any other focal or progressive neurologic symptoms.  Currently living at home with her father.    Medications: Current Outpatient Medications on File Prior to Visit  Medication Sig Dispense Refill   acetaminophen  (TYLENOL ) 500 MG tablet Take 500 mg by mouth every 6 (six) hours as needed for mild pain.      amphetamine-dextroamphetamine (ADDERALL) 20 MG tablet Take 20 mg by mouth daily.      clonazePAM  (KLONOPIN ) 0.5 MG  tablet Take 0.5 mg by mouth as needed for anxiety.      diazepam  (VALIUM ) 5 MG tablet Take 5 mg by mouth as directed. Per surgery scheduled     FLUoxetine  (PROZAC ) 20 MG capsule Take 20 mg by mouth daily.     LORazepam  (ATIVAN ) 1 MG tablet Take 1 tablet (1 mg total) by mouth once as needed for up to 1 dose for anxiety (mri sedation). 3 tablet 0   OXcarbazepine  (TRILEPTAL ) 150 MG tablet Take 1 tablet (150 mg total) by mouth 2 (two) times daily. 90 tablet 0   OXcarbazepine  (TRILEPTAL ) 150 MG tablet Take 1 tablet (150 mg total) by mouth 2 (two) times daily. 50 tablet 2   No current facility-administered medications on file prior to visit.    Allergies:  Allergies  Allergen Reactions   Doxycycline  Swelling   Past Medical History:  Past Medical History:  Diagnosis Date   Anxiety    Bipolar disorder (HCC)    Father denies diagnosis   Mass of left temporal lobe    Meningitis    PTSD (post-traumatic stress disorder)    Seizures (HCC)    Past Surgical History:  Past Surgical History:  Procedure Laterality Date   APPLICATION OF CRANIAL NAVIGATION N/A 07/05/2020   Procedure: APPLICATION OF CRANIAL NAVIGATION;  Surgeon: Van Gelinas, MD;  Location: [facility] OR;  Service: Neurosurgery;  Laterality: N/A;  APPLICATION OF CRANIAL NAVIGATION   CRANIOTOMY Left 07/05/2020   Procedure: CRANIOTOMY LEFT TEMPORAL FOR TUMOR RESECTION;  Surgeon: Van Gelinas, MD;  [facility] OR;  Service: Neurosurgery;  Laterality: Left;  CRANIOTOMY LEFT TEMPORAL FOR TUMOR RESECTION   OPEN REDUCTION INTERNAL FIXATION (ORIF) METACARPAL Right 12/19/2019   Procedure: OPEN TREATMENT OF RIGHT 5TH METACARPAL FRACTURE;  Surgeon: Rober Chimera, MD;  Location: [facility];  Service: Orthopedics;  Laterality: Right;  WITH PREOP REGIONAL BLOCK   OPERATIVE ULTRASOUND Left 07/05/2020   Procedure: OPERATIVE ULTRASOUND;  Surgeon: Van Gelinas, MD;  Location: [facility] OR;  Service: Neurosurgery;  Laterality: Left;    WISDOM TOOTH EXTRACTION     Social History:  Social History   Socioeconomic History   Marital status: Single    Spouse name: Not on file   Number of children: Not on file   Years of education: Not on file   Highest education level: Not on file  Occupational History   Not on file  Tobacco Use   Smoking status: Every Day    Current packs/day: 0.50    Types: Cigarettes   Smokeless tobacco: Former  Building services engineer status: Every Day  Substance and Sexual Activity   Alcohol use: Yes    Comment: 3 times a month    Drug use: Yes    Types: Marijuana   Sexual activity: Yes  Other Topics Concern   Not on file  Social History Narrative   Not on file   Social Drivers of Health   Financial Resource Strain: Not on file  Food Insecurity: Not on file  Transportation Needs: Not on file  Physical Activity: Not on file  Stress: Not on file  Social Connections: Unknown (04/05/2022)   Received from [facility], [facility]   Social Network    Social Network: Not on file  Intimate Partner Violence: Unknown (03/03/2022)   Received from [employer], [facility]   HITS    Physically Hurt: Not on file    Insult or Talk Down To: Not on file    Threaten Physical Harm: Not on file    Scream or Curse: Not on file   Family History:  Family History  Family history unknown: Yes    Review of Systems: Constitutional: Doesn't report fevers, chills or abnormal weight loss Eyes: Doesn't report blurriness of vision Ears, nose, mouth, throat, and face: Doesn't report sore throat Respiratory: Doesn't report cough, dyspnea or wheezes Cardiovascular: Doesn't report palpitation, chest discomfort  Gastrointestinal:  Doesn't report nausea, constipation, diarrhea GU: Doesn't report incontinence Skin: Doesn't report skin rashes Neurological: Per HPI Musculoskeletal: Doesn't report joint pain Behavioral/Psych: Doesn't report anxiety  Physical Exam: Vitals:   05/05/24 0900  BP: (!) 123/91   Pulse: 79  Resp: 18  Temp: 97.7 F (36.5 C)  SpO2: 99%    KPS: 90. General: Alert, cooperative, pleasant, in no acute distress Head: Normal EENT: No conjunctival injection or scleral icterus.  Lungs: Resp effort normal Cardiac: Regular rate Abdomen: Non-distended abdomen Skin: No rashes cyanosis or petechiae. Extremities: No clubbing or edema  Neurologic Exam: Mental Status: Awake, alert, attentive to examiner. Oriented to self and environment. Language is fluent with intact comprehension.  Cranial Nerves: Visual acuity is grossly normal. Visual fields are full. Extra-ocular movements intact. No ptosis. Face is symmetric Motor: Tone and bulk are normal. Power is full in both arms and legs. Reflexes are symmetric, no pathologic reflexes present.  Sensory: Intact to light touch Gait: Normal.   Labs: I have reviewed the data as listed    Component Value Date/Time   NA 139 01/11/2024 1607  K 4.1 01/11/2024 1607   CL 103 01/11/2024 1607   CO2 30 01/11/2024 1607   GLUCOSE 76 01/11/2024 1607   BUN 14 01/11/2024 1607   CREATININE 0.53 01/11/2024 1607   CALCIUM 9.7 01/11/2024 1607   PROT 6.9 01/11/2024 1607   ALBUMIN 4.1 01/11/2024 1607   AST 15 01/11/2024 1607   ALT 16 01/11/2024 1607   ALKPHOS 65 01/11/2024 1607   BILITOT 0.3 01/11/2024 1607   GFRNONAA >60 01/11/2024 1607   GFRAA >60 07/06/2020 0500   Lab Results  Component Value Date   WBC 9.6 01/11/2024   NEUTROABS 5.7 05/22/2020   HGB 13.2 01/11/2024   HCT 38.9 01/11/2024   MCV 94.0 01/11/2024   PLT 241 01/11/2024   Imaging:  [facility] Clinician Interpretation: I have personally reviewed the CNS images as listed.  My interpretation, in the context of the patient's clinical presentation, is progressive disease  MR Brain W Wo Contrast Result Date: 04/29/2024 CLINICAL DATA:  Brain/CNS neoplasm.  Astro blastoma.  Follow-up. EXAM: MRI HEAD WITHOUT AND WITH CONTRAST TECHNIQUE: Multiplanar, multiecho pulse sequences of  the brain and surrounding structures were obtained without and with intravenous contrast. CONTRAST:  5mL GADAVIST  GADOBUTROL  1 MMOL/ML IV SOLN COMPARISON:  06/18/2023 CT.  MRI 07/05/2020. FINDINGS: Brain: No abnormality is seen affecting the brainstem or cerebellum. There has been distant resection of a heterogeneous mass with the epicenter in the left posterior temporal lobe. Along the posteroinferior margin of the resection, there is a 13 x 15 mm region abnormal tissue with microcystic change, showing abnormal contrast enhancement. The characteristics are similar to the initial tumor presentation in 2021 in this is consistent with a small well-circumscribed focus of recurrent disease along the posterior margin of the resection. There is no mass effect or vasogenic edema. No distant focus of tumor recurrence is identified. 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 No hydrocephalus. No extra-axial collection. Vascular: Major vessels at the base of the brain show flow. Skull and upper cervical spine: Negative Sinuses/Orbits: Clear/normal Other: None IMPRESSION: 13 x 15 mm focus of abnormal tissue with microcystic change and contrast enhancement along the posteroinferior margin of the resection cavity in the left posterior temporal lobe. The characteristics are similar to the initial tumor presentation in 2021 and this is consistent with a small focus of recurrent disease along the posterior margin of the resection. No mass effect or vasogenic edema. No distant focus of tumor recurrence is identified. Electronically Signed   By: Bettylou Brunner M.D.   On: 04/29/2024 16:03     Assessment/Plan Astroblastoma (HCC) [C71.9]  BAO COREAS is clinically stable today.  Social and psychiatric issues have also stabilized, allowing for re-imaging and clinic follow up.    MRI does demonstrate some regrowth of tumor adjacent to initial resection site from 2021.   We reviewed treatment options including  re-resection with adjuvant radiotherapy, or radiation without surgical intervention.  We also discussed reaching out to [facility] about a second opinion.  Regardless, case will be discussed in person with her surgeon at next CNS tumor board meeting.    We will meet with her again in person or virtually to review that discussion and further the goals of care discussion initiated today.  Should otherwise continue on Trileptal  150mg  BID for seizure prevention.   We ask that Sonya Prince return to clinic in 1 week or sooner as needed.  All questions were answered. The patient knows to call the clinic  with any problems, questions or concerns. No barriers to learning were detected.  The total time spent in the encounter was 40 minutes and more than 50% was on counseling and review of test results   Mamie Searles, MD Medical Director of Neuro-Oncology [facility] at [facility] 05/05/24 9:05 AM

## 2024-05-05 NOTE — Telephone Encounter (Signed)
 T/C from pt requesting a letter for her psychiatrist stating it is ok for pt to continue on her psychotic medications and that they will not interfere with her current health condition

## 2024-05-09 ENCOUNTER — Inpatient Hospital Stay: Payer: MEDICAID

## 2024-05-12 ENCOUNTER — Inpatient Hospital Stay (HOSPITAL_BASED_OUTPATIENT_CLINIC_OR_DEPARTMENT_OTHER): Payer: MEDICAID | Admitting: Internal Medicine

## 2024-05-12 VITALS — BP 104/62 | HR 93 | Temp 98.1°F | Resp 20 | Wt 115.2 lb

## 2024-05-12 DIAGNOSIS — C712 Malignant neoplasm of temporal lobe: Secondary | ICD-10-CM | POA: Diagnosis not present

## 2024-05-12 DIAGNOSIS — C719 Malignant neoplasm of brain, unspecified: Secondary | ICD-10-CM | POA: Diagnosis not present

## 2024-05-12 DIAGNOSIS — S0990XA Unspecified injury of head, initial encounter: Secondary | ICD-10-CM

## 2024-05-12 NOTE — Progress Notes (Signed)
 [facility] [address]  [place], [place] [address] [phone]   Interval Evaluation  Date of Service: 05/12/24 Patient Name: Sonya Prince Patient MRN: [id number] Patient DOB: 03-01-1991 Provider: Mamie Searles, MD  Identifying Statement:  Sonya Prince is a 33 y.o. 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 Andy Bannister  Biomarkers:  MGMT Unknown .  IDH 1/2 Unknown.  MN1 Altered  Ki-67 5%   Interval History:  Sonya Prince presents today for clinical follow up, following CNS tumor board discussion.  No changes, no further seizures.  She has had visual scotomas infrequently without associated headaches, typically after caffeine beverages.  Continues to have some discomfort in neck and back.  Prior: She describes no new neurologic symptoms, MRI was obtained due to concerns following head trauma.  Overall she is doing better following incarceration, staying at home with her parents.  No reported seizures, compliance with Trileptal  has been inconsistent per patient.  Continues to follow closely with her psychiatrist.      H+P. Patient presented to medical attention with a seizure or series of seizures associated with loss of consciousness.  Further details of the seizure events are not well described or recalled.  There was also a seizure event in 2020 and another going back to 2019, per patient and records.  CNS imaging demonstrated a large, enhancing left temporal mass.  Associated calcifications had been noted going back to trauma CT head eval in 2017.  At this time she is back at baseline, no recurrent seizure events.  She denies any other focal or progressive neurologic symptoms.  Currently living at home with her father.    Medications: Current Outpatient Medications on File Prior to Visit  Medication Sig Dispense Refill   acetaminophen  (TYLENOL ) 500 MG  tablet Take 500 mg by mouth every 6 (six) hours as needed for mild pain.      amphetamine-dextroamphetamine (ADDERALL) 20 MG tablet Take 20 mg by mouth daily.      clonazePAM  (KLONOPIN ) 0.5 MG tablet Take 0.5 mg by mouth as needed for anxiety.      FLUoxetine  (PROZAC ) 20 MG capsule Take 20 mg by mouth daily.     LORazepam  (ATIVAN ) 1 MG tablet Take 1 tablet (1 mg total) by mouth once as needed for up to 1 dose for anxiety (mri sedation). 3 tablet 0   OXcarbazepine  (TRILEPTAL ) 150 MG tablet Take 1 tablet (150 mg total) by mouth 2 (two) times daily. 50 tablet 2   diazepam  (VALIUM ) 5 MG tablet Take 5 mg by mouth as directed. Per surgery scheduled (Patient not taking: Reported on 05/12/2024)     OXcarbazepine  (TRILEPTAL ) 150 MG tablet Take 1 tablet (150 mg total) by mouth 2 (two) times daily. 90 tablet 0   No current facility-administered medications on file prior to visit.    Allergies:  Allergies  Allergen Reactions   Doxycycline  Swelling   Past Medical History:  Past Medical History:  Diagnosis Date   Anxiety    Bipolar disorder (HCC)    Father denies diagnosis   Mass of left temporal lobe    Meningitis    PTSD (post-traumatic stress disorder)    Seizures (HCC)    Past Surgical History:  Past Surgical History:  Procedure Laterality Date   APPLICATION OF CRANIAL NAVIGATION N/A 07/05/2020   Procedure: APPLICATION OF CRANIAL NAVIGATION;  Surgeon: Van Gelinas, MD;  Location: [facility] OR;  Service: Neurosurgery;  Laterality: N/A;  APPLICATION OF CRANIAL NAVIGATION   CRANIOTOMY Left 07/05/2020   Procedure: CRANIOTOMY LEFT TEMPORAL FOR TUMOR RESECTION;  Surgeon: Van Gelinas, MD;  Location: [facility] OR;  Service: Neurosurgery;  Laterality: Left;  CRANIOTOMY LEFT TEMPORAL FOR TUMOR RESECTION   OPEN REDUCTION INTERNAL FIXATION (ORIF) METACARPAL Right 12/19/2019   Procedure: OPEN TREATMENT OF RIGHT 5TH METACARPAL FRACTURE;  Surgeon: Rober Chimera, MD;  Location: [facility];   Service: Orthopedics;  Laterality: Right;  WITH PREOP REGIONAL BLOCK   OPERATIVE ULTRASOUND Left 07/05/2020   Procedure: OPERATIVE ULTRASOUND;  Surgeon: Van Gelinas, MD;  Location: [facility] OR;  Service: Neurosurgery;  Laterality: Left;   WISDOM TOOTH EXTRACTION     Social History:  Social History   Socioeconomic History   Marital status: Single    Spouse name: Not on file   Number of children: Not on file   Years of education: Not on file   Highest education level: Not on file  Occupational History   Not on file  Tobacco Use   Smoking status: Every Day    Current packs/day: 0.50    Types: Cigarettes   Smokeless tobacco: Former  Building services engineer status: Every Day  Substance and Sexual Activity   Alcohol use: Yes    Comment: 3 times a month    Drug use: Yes    Types: Marijuana   Sexual activity: Yes  Other Topics Concern   Not on file  Social History Narrative   Not on file   Social Drivers of Health   Financial Resource Strain: Not on file  Food Insecurity: Not on file  Transportation Needs: Not on file  Physical Activity: Not on file  Stress: Not on file  Social Connections: Unknown (04/05/2022)   Received from [facility]   Social Network    Social Network: Not on file  Intimate Partner Violence: Unknown (03/03/2022)   Received from [facility]   HITS    Physically Hurt: Not on file    Insult or Talk Down To: Not on file    Threaten Physical Harm: Not on file    Scream or Curse: Not on file   Family History:  Family History  Family history unknown: Yes    Review of Systems: Constitutional: Doesn't report fevers, chills or abnormal weight loss Eyes: Doesn't report blurriness of vision Ears, nose, mouth, throat, and face: Doesn't report sore throat Respiratory: Doesn't report cough, dyspnea or wheezes Cardiovascular: Doesn't report palpitation, chest discomfort  Gastrointestinal:  Doesn't report nausea, constipation, diarrhea GU: Doesn't report  incontinence Skin: Doesn't report skin rashes Neurological: Per HPI Musculoskeletal: Doesn't report joint pain Behavioral/Psych: Doesn't report anxiety  Physical Exam: Vitals:   05/12/24 1528  BP: 104/62  Pulse: 93  Resp: 20  Temp: 98.1 F (36.7 C)  SpO2: 98%    KPS: 90. General: Alert, cooperative, pleasant, in no acute distress Head: Normal EENT: No conjunctival injection or scleral icterus.  Lungs: Resp effort normal Cardiac: Regular rate Abdomen: Non-distended abdomen Skin: No rashes cyanosis or petechiae. Extremities: No clubbing or edema  Neurologic Exam: Mental Status: Awake, alert, attentive to examiner. Oriented to self and environment. Language is fluent with intact comprehension.  Cranial Nerves: Visual acuity is grossly normal. Visual fields are full. Extra-ocular movements intact. No ptosis. Face is symmetric Motor: Tone and bulk are normal. Power is full in both arms and legs. Reflexes are symmetric, no pathologic reflexes present.  Sensory: Intact to light touch Gait: Normal.   Labs: I have reviewed the data as listed    Component Value Date/Time   NA 139 01/11/2024 1607   K 4.1 01/11/2024 1607   CL 103 01/11/2024 1607   CO2 30 01/11/2024 1607   GLUCOSE 76 01/11/2024 1607   BUN 14 01/11/2024 1607   CREATININE 0.53 01/11/2024 1607   CALCIUM 9.7 01/11/2024 1607   PROT 6.9 01/11/2024 1607   ALBUMIN 4.1 01/11/2024 1607   AST 15 01/11/2024 1607   ALT 16 01/11/2024 1607   ALKPHOS 65 01/11/2024 1607   BILITOT 0.3 01/11/2024 1607   GFRNONAA >60 01/11/2024 1607   GFRAA >60 07/06/2020 0500   Lab Results  Component Value Date   WBC 9.6 01/11/2024   NEUTROABS 5.7 05/22/2020   HGB 13.2 01/11/2024   HCT 38.9 01/11/2024   MCV 94.0 01/11/2024   PLT 241 01/11/2024   Imaging:  [facility] Clinician Interpretation: I have personally reviewed the CNS images as listed.  My interpretation, in the context of the patient's clinical presentation, is progressive  disease  MR Brain W Wo Contrast Result Date: 04/29/2024 CLINICAL DATA:  Brain/CNS neoplasm.  Astro blastoma.  Follow-up. EXAM: MRI HEAD WITHOUT AND WITH CONTRAST TECHNIQUE: Multiplanar, multiecho pulse sequences of the brain and surrounding structures were obtained without and with intravenous contrast. CONTRAST:  5mL GADAVIST  GADOBUTROL  1 MMOL/ML IV SOLN COMPARISON:  06/18/2023 CT.  MRI 07/05/2020. FINDINGS: Brain: No abnormality is seen affecting the brainstem or cerebellum. There has been distant resection of a heterogeneous mass with the epicenter in the left posterior temporal lobe. Along the posteroinferior margin of the resection, there is a 13 x 15 mm region abnormal tissue with microcystic change, showing abnormal contrast enhancement. The characteristics are similar to the initial tumor presentation in 2021 in this is consistent with a small well-circumscribed focus of recurrent disease along the posterior margin of the resection. There is no mass effect or vasogenic edema. No distant focus of tumor recurrence is identified. 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 No hydrocephalus. No extra-axial collection. Vascular: Major vessels at the base of the brain show flow. Skull and upper cervical spine: Negative Sinuses/Orbits: Clear/normal Other: None IMPRESSION: 13 x 15 mm focus of abnormal tissue with microcystic change and contrast enhancement along the posteroinferior margin of the resection cavity in the left posterior temporal lobe. The characteristics are similar to the initial tumor presentation in 2021 and this is consistent with a small focus of recurrent disease along the posterior margin of the resection. No mass effect or vasogenic edema. No distant focus of tumor recurrence is identified. Electronically Signed   By: Bettylou Brunner M.D.   On: 04/29/2024 16:03     Assessment/Plan Injury of head, initial encounter [S09.90XA]  GUILIANNA MCKOY is clinically stable today.   We discussed goals of care extensively following tumor board discussion earlier this week.    Ultimately recommended repeating MRI brain in 2-3 months for better characterization of tumor, growth rate.  This will also allow her to continue to get other health problems addressed, and career development on current track.    Would consider referral to [facility] neurosurgery for second opinion following next scan.  Prior: Social and psychiatric issues have also stabilized, allowing for re-imaging and clinic follow up.    MRI does demonstrate some regrowth of tumor adjacent to initial resection site from 2021.   We reviewed treatment options including re-resection with adjuvant  radiotherapy, or radiation without surgical intervention.  We also discussed reaching out to [facility] about a second opinion.  Regardless, case will be discussed in person with her surgeon at next CNS tumor board meeting.     We ask that MYKENZI VANZILE return to clinic in 3 months or sooner as needed.  All questions were answered. The patient knows to call the clinic with any problems, questions or concerns. No barriers to learning were detected.  The total time spent in the encounter was 40 minutes and more than 50% was on counseling and review of test results   Mamie Searles, MD Medical Director of Neuro-Oncology [facility] at [facility] 05/12/24 4:53 PM

## 2024-05-16 ENCOUNTER — Other Ambulatory Visit: Payer: Self-pay | Admitting: Radiation Therapy
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
## 2024-05-26 ENCOUNTER — Ambulatory Visit (INDEPENDENT_AMBULATORY_CARE_PROVIDER_SITE_OTHER): Payer: MEDICAID

## 2024-05-26 ENCOUNTER — Other Ambulatory Visit: Payer: Self-pay

## 2024-05-26 ENCOUNTER — Encounter: Payer: MEDICAID | Admitting: Family Medicine

## 2024-05-26 ENCOUNTER — Encounter: Payer: Self-pay | Admitting: Family Medicine

## 2024-05-26 ENCOUNTER — Ambulatory Visit: Payer: Self-pay | Admitting: Family Medicine

## 2024-05-26 ENCOUNTER — Ambulatory Visit (INDEPENDENT_AMBULATORY_CARE_PROVIDER_SITE_OTHER): Payer: MEDICAID | Admitting: Family Medicine

## 2024-05-26 VITALS — BP 112/84 | HR 84 | Ht 62.0 in | Wt 117.0 lb

## 2024-05-26 DIAGNOSIS — M25562 Pain in left knee: Secondary | ICD-10-CM | POA: Diagnosis not present

## 2024-05-26 DIAGNOSIS — C719 Malignant neoplasm of brain, unspecified: Secondary | ICD-10-CM | POA: Diagnosis not present

## 2024-05-26 DIAGNOSIS — G8929 Other chronic pain: Secondary | ICD-10-CM

## 2024-05-26 DIAGNOSIS — Z87820 Personal history of traumatic brain injury: Secondary | ICD-10-CM | POA: Diagnosis not present

## 2024-05-26 NOTE — Progress Notes (Signed)
 Left knee x-ray looked normal to radiology.

## 2024-05-26 NOTE — Patient Instructions (Addendum)
 Thank you for coming in today.   Please get an Xray today before you leave   See you back as needed - for your next visit, lets try making a list of the things you would like to discuss so that we can optimize our time together and address your concerns effectively.

## 2024-05-30 ENCOUNTER — Encounter: Payer: MEDICAID | Admitting: Family Medicine

## 2024-06-13 ENCOUNTER — Other Ambulatory Visit: Payer: Self-pay

## 2024-06-13 ENCOUNTER — Ambulatory Visit (INDEPENDENT_AMBULATORY_CARE_PROVIDER_SITE_OTHER): Payer: MEDICAID | Admitting: Family Medicine

## 2024-06-13 VITALS — BP 110/74 | HR 90 | Ht 62.0 in | Wt 114.0 lb

## 2024-06-13 DIAGNOSIS — G8929 Other chronic pain: Secondary | ICD-10-CM

## 2024-06-13 DIAGNOSIS — M542 Cervicalgia: Secondary | ICD-10-CM

## 2024-06-13 DIAGNOSIS — M25511 Pain in right shoulder: Secondary | ICD-10-CM

## 2024-06-13 DIAGNOSIS — M25562 Pain in left knee: Secondary | ICD-10-CM | POA: Diagnosis not present

## 2024-06-13 NOTE — Patient Instructions (Addendum)
 Thank you for coming in today.   You should hear from MRI scheduling within 1 week. If you do not hear please let me know.    I've referred you to Physical Therapy.  Let us  know if you don't hear from them in one week.   Check back in 2 months
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
## 2024-06-14 ENCOUNTER — Encounter: Payer: Self-pay | Admitting: Family Medicine
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
## 2024-06-22 ENCOUNTER — Other Ambulatory Visit: Payer: Self-pay

## 2024-06-22 ENCOUNTER — Ambulatory Visit: Payer: MEDICAID | Attending: Family Medicine

## 2024-06-22 DIAGNOSIS — M25562 Pain in left knee: Secondary | ICD-10-CM | POA: Insufficient documentation

## 2024-06-22 DIAGNOSIS — G8929 Other chronic pain: Secondary | ICD-10-CM | POA: Insufficient documentation

## 2024-06-22 DIAGNOSIS — M25511 Pain in right shoulder: Secondary | ICD-10-CM | POA: Diagnosis present

## 2024-06-22 DIAGNOSIS — M542 Cervicalgia: Secondary | ICD-10-CM | POA: Insufficient documentation

## 2024-06-27 ENCOUNTER — Telehealth: Payer: Self-pay

## 2024-06-27 NOTE — Telephone Encounter (Signed)
 Pending denial for patients shoulder MRI as patient has not had 4 weeks of tried and failed treatment done in the last 6 months of PT or HEP. Patients first documented appointment for shoulder pain was on 06/13/24. I see patient started PT on 06/22/24.  4 weeks would be 07/11/24   Knee is also pending denial, but looks like patient has had 4 weeks of tried and failed treatment. I have provided that information to patients insurance.

## 2024-06-28 ENCOUNTER — Ambulatory Visit: Payer: MEDICAID

## 2024-06-28 DIAGNOSIS — M542 Cervicalgia: Secondary | ICD-10-CM | POA: Diagnosis not present

## 2024-06-28 DIAGNOSIS — G8929 Other chronic pain: Secondary | ICD-10-CM

## 2024-06-28 NOTE — Telephone Encounter (Signed)
 Left knee MRI is approved to be done for patients 06/29/24 appointment
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
## 2024-06-29 ENCOUNTER — Telehealth: Payer: Self-pay | Admitting: General Practice

## 2024-06-29 ENCOUNTER — Ambulatory Visit: Payer: MEDICAID

## 2024-06-29 ENCOUNTER — Inpatient Hospital Stay
Admission: RE | Admit: 2024-06-29 | Discharge: 2024-06-29 | Discharge status: Home / Self Care | Payer: MEDICAID | Source: Ambulatory Visit | Attending: Family Medicine | Admitting: Family Medicine

## 2024-06-29 DIAGNOSIS — G8929 Other chronic pain: Secondary | ICD-10-CM

## 2024-06-29 NOTE — Telephone Encounter (Unsigned)
 Copied from CRM [id number]. Topic: General - Other >> Jun 29, 2024  9:45 AM Gennette ORN wrote: Reason for CRM: Annabelle LIGHTER [phone] ext [phone] is calling because patient wasn't approved for the MRI on right shoulder. She wants to do peer to peer. Tracking Number [id number]   Call to do peer to peer [phone].

## 2024-07-01 ENCOUNTER — Ambulatory Visit: Payer: Self-pay | Admitting: Family Medicine

## 2024-07-01 NOTE — Progress Notes (Signed)
 Left knee MRI looks normal to radiology.  This is good news. Please continue PT.

## 2024-07-04 ENCOUNTER — Telehealth: Payer: Self-pay | Admitting: *Deleted
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
## 2024-07-04 NOTE — Telephone Encounter (Signed)
 Patient called last week requesting MD appointment, did not disclose reason for appointment.  Informed her Dr Buckley can see her tomorrow (07/05/24) at 12:00.  She verbalizes understanding, states she can make this appointment.
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
## 2024-07-05 ENCOUNTER — Ambulatory Visit: Payer: MEDICAID | Attending: Family Medicine

## 2024-07-05 ENCOUNTER — Inpatient Hospital Stay: Payer: MEDICAID | Attending: Internal Medicine | Admitting: Internal Medicine

## 2024-07-05 VITALS — BP 107/72 | HR 96 | Temp 97.9°F | Resp 20 | Wt 114.6 lb

## 2024-07-05 DIAGNOSIS — Z79899 Other long term (current) drug therapy: Secondary | ICD-10-CM | POA: Insufficient documentation

## 2024-07-05 DIAGNOSIS — C712 Malignant neoplasm of temporal lobe: Secondary | ICD-10-CM | POA: Diagnosis present

## 2024-07-05 DIAGNOSIS — M25511 Pain in right shoulder: Secondary | ICD-10-CM | POA: Diagnosis present

## 2024-07-05 DIAGNOSIS — C719 Malignant neoplasm of brain, unspecified: Secondary | ICD-10-CM

## 2024-07-05 DIAGNOSIS — M542 Cervicalgia: Secondary | ICD-10-CM | POA: Diagnosis present

## 2024-07-05 DIAGNOSIS — M25562 Pain in left knee: Secondary | ICD-10-CM | POA: Insufficient documentation

## 2024-07-05 DIAGNOSIS — R569 Unspecified convulsions: Secondary | ICD-10-CM | POA: Diagnosis not present

## 2024-07-05 DIAGNOSIS — G8929 Other chronic pain: Secondary | ICD-10-CM | POA: Insufficient documentation

## 2024-07-05 DIAGNOSIS — F17210 Nicotine dependence, cigarettes, uncomplicated: Secondary | ICD-10-CM | POA: Insufficient documentation

## 2024-07-05 DIAGNOSIS — F4310 Post-traumatic stress disorder, unspecified: Secondary | ICD-10-CM | POA: Diagnosis not present
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
## 2024-07-07 ENCOUNTER — Encounter: Payer: Self-pay | Admitting: Internal Medicine

## 2024-07-07 ENCOUNTER — Other Ambulatory Visit: Payer: Self-pay | Admitting: *Deleted

## 2024-07-07 DIAGNOSIS — R569 Unspecified convulsions: Secondary | ICD-10-CM

## 2024-07-07 MED ORDER — OXCARBAZEPINE 150 MG PO TABS: 150.0000 mg | ORAL_TABLET | Freq: Two times a day (BID) | ORAL | 2 refills | Status: DC

## 2024-07-11 ENCOUNTER — Encounter: Payer: Self-pay | Admitting: Internal Medicine
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
## 2024-07-12 ENCOUNTER — Ambulatory Visit: Payer: MEDICAID

## 2024-07-12 DIAGNOSIS — G8929 Other chronic pain: Secondary | ICD-10-CM

## 2024-07-12 DIAGNOSIS — M542 Cervicalgia: Secondary | ICD-10-CM | POA: Diagnosis not present

## 2024-07-13 ENCOUNTER — Encounter: Payer: Self-pay | Admitting: Family Medicine
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
## 2024-07-19 ENCOUNTER — Ambulatory Visit: Payer: MEDICAID
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
## 2024-07-21 ENCOUNTER — Ambulatory Visit: Payer: MEDICAID

## 2024-07-21 ENCOUNTER — Encounter: Payer: Self-pay | Admitting: Internal Medicine

## 2024-07-21 DIAGNOSIS — G8929 Other chronic pain: Secondary | ICD-10-CM

## 2024-07-21 DIAGNOSIS — M542 Cervicalgia: Secondary | ICD-10-CM | POA: Diagnosis not present

## 2024-07-23 ENCOUNTER — Emergency Department (HOSPITAL_COMMUNITY)
Admission: EM | Admit: 2024-07-23 | Discharge: 2024-07-23 | Disposition: A | Discharge status: Home / Self Care | Payer: MEDICAID | Attending: Emergency Medicine | Admitting: Emergency Medicine

## 2024-07-23 DIAGNOSIS — R4589 Other symptoms and signs involving emotional state: Secondary | ICD-10-CM | POA: Insufficient documentation

## 2024-07-23 DIAGNOSIS — Y900 Blood alcohol level of less than 20 mg/100 ml: Secondary | ICD-10-CM | POA: Insufficient documentation

## 2024-07-23 DIAGNOSIS — R4689 Other symptoms and signs involving appearance and behavior: Secondary | ICD-10-CM

## 2024-07-23 LAB — RAPID URINE DRUG SCREEN, HOSP PERFORMED
Amphetamines: NOT DETECTED
Barbiturates: NOT DETECTED
Benzodiazepines: NOT DETECTED
Cocaine: NOT DETECTED
Opiates: NOT DETECTED
Tetrahydrocannabinol: NOT DETECTED

## 2024-07-23 LAB — COMPREHENSIVE METABOLIC PANEL WITH GFR
ALT: 18 U/L (ref 0–44)
AST: 18 U/L (ref 15–41)
Albumin: 4.3 g/dL (ref 3.5–5.0)
Alkaline Phosphatase: 51 U/L (ref 38–126)
Anion gap: 8 (ref 5–15)
BUN: 11 mg/dL (ref 6–20)
CO2: 25 mmol/L (ref 22–32)
Calcium: 9.4 mg/dL (ref 8.9–10.3)
Chloride: 103 mmol/L (ref 98–111)
Creatinine, Ser: 0.67 mg/dL (ref 0.44–1.00)
GFR, Estimated: >60 mL/min (ref >60)
Glucose, Bld: 102 mg/dL — ABNORMAL HIGH (ref 70–99)
Potassium: 3.7 mmol/L (ref 3.5–5.1)
Sodium: 136 mmol/L (ref 135–145)
Total Bilirubin: 0.6 mg/dL (ref 0.0–1.2)
Total Protein: 7.9 g/dL (ref 6.5–8.1)

## 2024-07-23 LAB — CBC WITH DIFFERENTIAL/PLATELET
Abs Immature Granulocytes: 0.03 K/uL (ref 0.00–0.07)
Basophils Absolute: 0 K/uL (ref 0.0–0.1)
Basophils Relative: 0 %
Eosinophils Absolute: 0.1 K/uL (ref 0.0–0.5)
Eosinophils Relative: 1 %
HCT: 42.2 % (ref 36.0–46.0)
Hemoglobin: 14.1 g/dL (ref 12.0–15.0)
Immature Granulocytes: 0 %
Lymphocytes Relative: 22 %
Lymphs Abs: 2.2 K/uL (ref 0.7–4.0)
MCH: 31.5 pg (ref 26.0–34.0)
MCHC: 33.4 g/dL (ref 30.0–36.0)
MCV: 94.4 fL (ref 80.0–100.0)
Monocytes Absolute: 0.6 K/uL (ref 0.1–1.0)
Monocytes Relative: 6 %
Neutro Abs: 7 K/uL (ref 1.7–7.7)
Neutrophils Relative %: 71 %
Platelets: 255 K/uL (ref 150–400)
RBC: 4.47 MIL/uL (ref 3.87–5.11)
RDW: 11.3 % — ABNORMAL LOW (ref 11.5–15.5)
WBC: 10 K/uL (ref 4.0–10.5)
nRBC: 0 % (ref 0.0–0.2)

## 2024-07-23 LAB — HCG, SERUM, QUALITATIVE: Preg, Serum: NEGATIVE

## 2024-07-23 LAB — ETHANOL: Alcohol, Ethyl (B): <15 mg/dL (ref <15)

## 2024-07-23 MED ORDER — NICOTINE 14 MG/24HR TD PT24
14.0000 mg | MEDICATED_PATCH | Freq: Once | TRANSDERMAL | Status: DC
  Administered 2024-07-23: 14 mg via TRANSDERMAL
  Filled 2024-07-23: qty 1

## 2024-07-23 MED ORDER — NICOTINE 14 MG/24HR TD PT24: 14.0000 mg | MEDICATED_PATCH | Freq: Once | TRANSDERMAL | Status: DC
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
## 2024-07-23 NOTE — ED Notes (Signed)
 IVC orders were rescinded by EDP

## 2024-07-23 NOTE — ED Notes (Signed)
 Patient has been placed in burgundy scrubs and yellow socks.  Patient has been wanded by security.  Patient has clothes and a brown mini backpack with has her I phone in it.  I place it in the cabinet that has 9 thru [address] on it.

## 2024-07-23 NOTE — ED Triage Notes (Signed)
 Patient brought in by [employer] under IVC IVC states patient has been diagnosed with brain tumor and says patient has stated she should kill herself or harm family. Patient denies pain in triage Patient denies SI/HI in triage. States she just isn't please and this is a complicated situation

## 2024-07-23 NOTE — Discharge Instructions (Signed)
 I do not feel that you need inpatient criteria today and to be with our psychiatric team.  I understand you have some difficulties at home.  I have provided some resources for shelters in the event that you would like to stay somewhere else.  Please return to the emergency department for any worsening symptoms or concerns you might have.

## 2024-07-28 ENCOUNTER — Encounter (HOSPITAL_COMMUNITY): Payer: Self-pay

## 2024-07-28 ENCOUNTER — Ambulatory Visit (HOSPITAL_COMMUNITY)
Admission: RE | Admit: 2024-07-28 | Discharge: 2024-07-28 | Disposition: A | Discharge status: Home / Self Care | Payer: MEDICAID | Source: Ambulatory Visit | Attending: Internal Medicine | Admitting: Internal Medicine

## 2024-07-28 DIAGNOSIS — C719 Malignant neoplasm of brain, unspecified: Secondary | ICD-10-CM

## 2024-07-30 ENCOUNTER — Ambulatory Visit (HOSPITAL_COMMUNITY)
Admission: RE | Admit: 2024-07-30 | Discharge: 2024-07-30 | Disposition: A | Discharge status: Home / Self Care | Payer: MEDICAID | Source: Ambulatory Visit | Attending: Internal Medicine | Admitting: Internal Medicine

## 2024-07-30 DIAGNOSIS — C719 Malignant neoplasm of brain, unspecified: Secondary | ICD-10-CM | POA: Insufficient documentation

## 2024-07-30 MED ORDER — GADOBUTROL 1 MMOL/ML IV SOLN
5.0000 mL | Freq: Once | INTRAVENOUS | Status: AC | PRN
  Administered 2024-07-30: 5 mL via INTRAVENOUS

## 2024-08-04 ENCOUNTER — Inpatient Hospital Stay: Payer: MEDICAID | Attending: Internal Medicine | Admitting: Internal Medicine

## 2024-08-04 ENCOUNTER — Encounter: Payer: Self-pay | Admitting: Internal Medicine

## 2024-08-04 ENCOUNTER — Other Ambulatory Visit: Payer: Self-pay | Admitting: Radiation Therapy

## 2024-08-04 VITALS — BP 117/71 | HR 88 | Temp 97.9°F | Resp 14 | Wt 116.9 lb

## 2024-08-04 DIAGNOSIS — C712 Malignant neoplasm of temporal lobe: Secondary | ICD-10-CM | POA: Diagnosis present

## 2024-08-04 DIAGNOSIS — S069X9A Unspecified intracranial injury with loss of consciousness of unspecified duration, initial encounter: Secondary | ICD-10-CM | POA: Insufficient documentation

## 2024-08-04 DIAGNOSIS — R569 Unspecified convulsions: Secondary | ICD-10-CM | POA: Diagnosis not present

## 2024-08-04 DIAGNOSIS — Z79899 Other long term (current) drug therapy: Secondary | ICD-10-CM | POA: Insufficient documentation

## 2024-08-04 DIAGNOSIS — C719 Malignant neoplasm of brain, unspecified: Secondary | ICD-10-CM | POA: Diagnosis not present

## 2024-08-04 DIAGNOSIS — F17210 Nicotine dependence, cigarettes, uncomplicated: Secondary | ICD-10-CM | POA: Insufficient documentation

## 2024-08-04 MED ORDER — LAMOTRIGINE 100 MG PO TABS: 100.0000 mg | ORAL_TABLET | Freq: Every day | ORAL | 1 refills | Status: AC

## 2024-08-04 MED ORDER — LAMOTRIGINE 25 MG PO TABS: ORAL_TABLET | ORAL | 0 refills | Status: DC

## 2024-08-04 NOTE — Progress Notes (Signed)
 [facility] [address]  [place], [place] [address] [phone]   Interval Evaluation  Date of Service: 08/04/24 Patient Name: Sonya Prince Patient MRN: [id number] Patient DOB: [birth year] Provider: [name], MD  Identifying Statement:  Sonya Prince is a 33 y.o. 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 Debby  Biomarkers:  MGMT Unknown .  IDH 1/2 Unknown.  MN1 Altered  Ki-67 5%   Interval History:  EMMAGENE Prince presents today for clinical follow up.  No changes, no further seizures.  She admits she has not been dosing her Trileptal , except sporadically.  She would be open to a different agent.  Previously she had described visual scotomas infrequently without associated headaches, typically after caffeine beverages.  Continues to have some discomfort in neck and back.  Prior: She describes no new neurologic symptoms, MRI was obtained due to concerns following head trauma.  Overall she is doing better following incarceration, staying at home with her parents.  No reported seizures, compliance with Trileptal  has been inconsistent per patient.  Continues to follow closely with her psychiatrist.      H+P. Patient presented to medical attention with a seizure or series of seizures associated with loss of consciousness.  Further details of the seizure events are not well described or recalled.  There was also a seizure event in 2020 and another going back to 2019, per patient and records.  CNS imaging demonstrated a large, enhancing left temporal mass.  Associated calcifications had been noted going back to trauma CT head eval in 2017.  At this time she is back at baseline, no recurrent seizure events.  She denies any other focal or progressive neurologic symptoms.  Currently living at home with her father.    Medications: Current Outpatient  Medications on File Prior to Visit  Medication Sig Dispense Refill   acetaminophen  (TYLENOL ) 500 MG tablet Take 500 mg by mouth every 6 (six) hours as needed for mild pain.  (Patient not taking: Reported on 07/05/2024)     amphetamine-dextroamphetamine (ADDERALL) 20 MG tablet Take 20 mg by mouth daily.  (Patient not taking: Reported on 07/05/2024)     clonazePAM  (KLONOPIN ) 0.5 MG tablet Take 0.5 mg by mouth as needed for anxiety.  (Patient not taking: Reported on 07/05/2024)     diazepam  (VALIUM ) 5 MG tablet Take 5 mg by mouth as directed. Per surgery scheduled (Patient not taking: Reported on 07/05/2024)     FLUoxetine  (PROZAC ) 20 MG capsule Take 20 mg by mouth daily. (Patient not taking: Reported on 07/05/2024)     LORazepam  (ATIVAN ) 1 MG tablet Take 1 tablet (1 mg total) by mouth once as needed for up to 1 dose for anxiety (mri sedation). (Patient not taking: Reported on 07/05/2024) 3 tablet 0   OXcarbazepine  (TRILEPTAL ) 150 MG tablet Take 1 tablet (150 mg total) by mouth 2 (two) times daily. 50 tablet 2   No current facility-administered medications on file prior to visit.    Allergies:  Allergies  Allergen Reactions   Doxycycline  Swelling   Past Medical History:  Past Medical History:  Diagnosis Date   Anxiety    Bipolar disorder (HCC)    Father denies diagnosis   Mass of left temporal lobe    Meningitis    PTSD (post-traumatic stress disorder)    Seizures (HCC)    Past Surgical History:  Past Surgical History:  Procedure Laterality Date   APPLICATION OF CRANIAL NAVIGATION N/A 07/05/2020   Procedure: APPLICATION OF CRANIAL NAVIGATION;  Surgeon: Debby Dorn MATSU, MD;  Location: [facility] OR;  Service: Neurosurgery;  Laterality: N/A;  APPLICATION OF CRANIAL NAVIGATION   CRANIOTOMY Left 07/05/2020   Procedure: CRANIOTOMY LEFT TEMPORAL FOR TUMOR RESECTION;  Surgeon: Debby Dorn MATSU, MD;  Location: [facility] OR;  Service: Neurosurgery;  Laterality: Left;  CRANIOTOMY LEFT TEMPORAL FOR TUMOR RESECTION    OPEN REDUCTION INTERNAL FIXATION (ORIF) METACARPAL Right 12/19/2019   Procedure: OPEN TREATMENT OF RIGHT 5TH METACARPAL FRACTURE;  Surgeon: Sebastian Lenis, MD;  Location: [facility];  Service: Orthopedics;  Laterality: Right;  WITH PREOP REGIONAL BLOCK   OPERATIVE ULTRASOUND Left 07/05/2020   Procedure: OPERATIVE ULTRASOUND;  Surgeon: Debby Dorn MATSU, MD;  Location[facility] OR;  Service: Neurosurgery;  Laterality: Left;   WISDOM TOOTH EXTRACTION     Social History:  Social History   Socioeconomic History   Marital status: Single    Spouse name: Not on file   Number of children: Not on file   Years of education: Not on file   Highest education level: Not on file  Occupational History   Not on file  Tobacco Use   Smoking status: Every Day    Current packs/day: 0.50    Types: Cigarettes   Smokeless tobacco: Former  Building services engineer status: Every Day  Substance and Sexual Activity   Alcohol use: Yes    Comment: 3 times a month    Drug use: Yes    Types: Marijuana   Sexual activity: Yes  Other Topics Concern   Not on file  Social History Narrative   Not on file   Social Drivers of Health   Financial Resource Strain: Not on file  Food Insecurity: Not on file  Transportation Needs: Not on file  Physical Activity: Not on file  Stress: Not on file  Social Connections: Unknown (04/05/2022)   [facility] from [facility]   Social Network    Social Network: Not on file  Intimate Partner Violence: Unknown (03/03/2022)   Received from [facility]   HITS    Physically Hurt: Not on file    Insult or Talk Down To: Not on file    Threaten Physical Harm: Not on file    Scream or Curse: Not on file   Family History:  Family History  Family history unknown: Yes    Review of Systems: Constitutional: Doesn't report fevers, chills or abnormal weight loss Eyes: Doesn't report blurriness of vision Ears, nose, mouth, throat, and face: Doesn't report sore  throat Respiratory: Doesn't report cough, dyspnea or wheezes Cardiovascular: Doesn't report palpitation, chest discomfort  Gastrointestinal:  Doesn't report nausea, constipation, diarrhea GU: Doesn't report incontinence Skin: Doesn't report skin rashes Neurological: Per HPI Musculoskeletal: Doesn't report joint pain Behavioral/Psych: Doesn't report anxiety  Physical Exam: Vitals:   08/04/24 1017  BP: 117/71  Pulse: 88  Resp: 14  Temp: 97.9 F (36.6 C)  SpO2: 100%    KPS: 90. General: Alert, cooperative, pleasant, in no acute distress Head: Normal EENT: No conjunctival injection or scleral icterus.  Lungs: Resp effort normal Cardiac: Regular rate Abdomen: Non-distended abdomen Skin: No rashes cyanosis or petechiae. Extremities: No clubbing or edema  Neurologic Exam: Mental Status: Awake, alert, attentive to examiner. Oriented to self and environment. Language is fluent with intact comprehension.  Cranial Nerves: Visual acuity is grossly normal. Visual fields are full. Extra-ocular movements intact. No  ptosis. Face is symmetric Motor: Tone and bulk are normal. Power is full in both arms and legs. Reflexes are symmetric, no pathologic reflexes present.  Sensory: Intact to light touch Gait: Normal.   Labs: I have reviewed the data as listed    Component Value Date/Time   NA 136 07/23/2024 1602   K 3.7 07/23/2024 1602   CL 103 07/23/2024 1602   CO2 25 07/23/2024 1602   GLUCOSE 102 (H) 07/23/2024 1602   BUN 11 07/23/2024 1602   CREATININE 0.67 07/23/2024 1602   CALCIUM 9.4 07/23/2024 1602   PROT 7.9 07/23/2024 1602   ALBUMIN 4.3 07/23/2024 1602   AST 18 07/23/2024 1602   ALT 18 07/23/2024 1602   ALKPHOS 51 07/23/2024 1602   BILITOT 0.6 07/23/2024 1602   GFRNONAA >60 07/23/2024 1602   GFRAA >60 07/06/2020 0500   Lab Results  Component Value Date   WBC 10.0 07/23/2024   NEUTROABS 7.0 07/23/2024   HGB 14.1 07/23/2024   HCT 42.2 07/23/2024   MCV 94.4 07/23/2024    PLT 255 07/23/2024    Imaging:  [facility] Clinician Interpretation: I have personally reviewed the CNS images as listed.  My interpretation, in the context of the patient's clinical presentation, is progressive disease  MR BRAIN W WO CONTRAST Result Date: 07/30/2024 CLINICAL DATA:  Brain/CNS neoplasm, assess treatment response EXAM: MRI HEAD WITHOUT AND WITH CONTRAST TECHNIQUE: Multiplanar, multiecho pulse sequences of the brain and surrounding structures were obtained without and with intravenous contrast. CONTRAST:  5mL GADAVIST  GADOBUTROL  1 MMOL/ML IV SOLN COMPARISON:  Apr 29, 2024. FINDINGS: Brain: Left temporal resection cavity. Interval increase in size of a now 14 x 19 x 14 mm area of enhancement along the posterior margin of the resection cavity (previously 13 x 15 x 11 mm), concerning for progression of residual tumor. No substantial mass effect or surrounding brain edema. No other areas of enhancement identified. No evidence of acute infarct, midline shift, acute hemorrhage, or hydrocephalus. Vascular: Major arterial flow voids are maintained at the skull base. Skull and upper cervical spine: Sequela of prior left craniotomy. Otherwise, normal marrow signal. Sinuses/Orbits: Negative. IMPRESSION: Interval increase in size of a now 14 x 19 x 14 mm area of enhancement along the posterior margin of the resection cavity (previously 13 x 15 x 11 mm), concerning for progression of tumor. Electronically Signed   By: Gilmore GORMAN Molt M.D.   On: 07/30/2024 16:09   Assessment/Plan Astroblastoma (HCC) [C71.9]  TIDA SANER is clinically stable today.  Repeat MRI brain at 3 month interval demonstrated measurable progression, consistent with active recurrent and progressive disease adjacent to prior resection cavity.  We discussed goals of care again today at bedside.    We reviewed treatment options, including re-resection with adjuvant radiotherapy, or radiation without surgical intervention.  At this  time would favor re-resection followed by adjuvant IMRT radiation plan.  Will review in detail in CNS tumor board this week.  Will place referral to [facility] neurosurgery for second surgical opinion as discussed.  Recommended trial of Lamictal  for seizure prevention.  Will start with 25mg  daily x7 days, then 50mg  daily x7 days, then 100mg  daily thereafter if tolerated.  They will let us  know if there is a rash that develops.  We ask that SMERA GUYETTE return to clinic TBD following [facility] referral.   All questions were answered. The patient knows to call the clinic with any problems, questions or concerns. No barriers to learning were detected.  The total time spent in the encounter was 40 minutes and more than 50% was on counseling and review of test results   [name], MD Medical Director of Neuro-Oncology [facility] 08/04/24 10:06 AM

## 2024-08-05 ENCOUNTER — Telehealth: Payer: Self-pay | Admitting: *Deleted

## 2024-08-05 NOTE — Telephone Encounter (Signed)
 Katheryn Culliton Chaidez's mother Kaira Stringfield [phone]) called as advised by collaborative about disability forms. Xochilth E Gruszka has a form that needs tp be updated. No form in Media, Notes and Correspondence. It is a disability form from Department of Social Services or Optometrist.  She had it for a year and now is having trouble with it,  All questions answered.  Advised the Social Security Disability is not for the provider.  Patient, friend, family or a Museum/gallery exhibitions officer are able to assist.  Whoever assist must sign the form.  SS-A Form staff completes employer disability if out of work on leave of absence unable to return.  Sign the SS-A authorization form which will be sent to every provider listed on the application she has seen over the last twelve months.  SS-A compares application to medical records to make their decision.  If Patient portal MyCH[employer] is used, providers, past appointments, test results after visit summaries and more are available to find information.  You just made light or down-played a symptom.  Do not leave anything out that has occurred and affected or is affecting her life activities, abilities, ADL's that have been, are being affected or she is no longer able to do due to restrictions and limitations.   Denied further questions or needs.

## 2024-08-08 ENCOUNTER — Inpatient Hospital Stay: Payer: MEDICAID

## 2024-08-11 ENCOUNTER — Encounter: Payer: Self-pay | Admitting: Internal Medicine

## 2024-08-13 ENCOUNTER — Encounter: Payer: Self-pay | Admitting: Family Medicine

## 2024-08-16 ENCOUNTER — Ambulatory Visit: Admitting: Family Medicine

## 2024-08-18 NOTE — Progress Notes (Unsigned)
 LILLETTE Ileana Collet, PhD, LAT, ATC acting as a scribe for Artist Lloyd, MD.  Sonya Prince is a 33 y.o. female who presents to [facility] today for f/u R shoulder, L knee, and neck pain w/ MRI review. Pt was last seen by Dr. Lloyd on 06/13/24 and was referred to PT, completing 5 visits, and MRI's were ordered.  Today, pt reports L knee was particularly painful last night. She notes she is pretty inactive, so her pain is hard to determine.   Dx testing: 07/30/24 Brain MRI  06/29/24 L knee MRI 05/26/24 L knee XR 04/29/24 Brain MRI   Pertinent review of systems: No fevers or chills  Relevant historical information: Astra blastoma with recurrence will need repeat surgery soon.  She estimates it will be at [facility] in October or November.   Exam:  BP 110/70   Pulse 73   Ht 5' 4 (1.626 m)   Wt 115 lb (52.2 kg)   LMP 07/21/2024   SpO2 98%   BMI 19.74 kg/m  General: Well Developed, well nourished, and in no acute distress.   MSK: Shoulders bilaterally normal-appearing normal motion.  Right knee normal motion.  Normal gait.    Lab and Radiology Results  MR KNEE WITHOUT IV CONTRAST LEFT   COMPARISON: None.   CLINICAL HISTORY: Knee pain, meniscal injury.   PULSE SEQUENCES: Ax PD FS, Sag T2 ACL, Sag PD FS, Cor PD FS & COR T1   FINDINGS: Bones: There is no fracture or contusion pattern. No full-thickness cartilage defect is identified. There is no significant joint effusion. The extensor mechanism is intact.   Ligaments: The ACL, PCL, MCL and fibular collateral ligament are intact.   Menisci: Medial and lateral menisci are unremarkable.   IMPRESSION: Unremarkable MRI of the left knee.   Electronically signed by: Norleen Satchel MD 06/30/2024 05:05 PM EDT RP Workstation: [id number]  LILLETTE Artist Lloyd, personally (independently) visualized and performed the interpretation of the images attached in this note.      Assessment and Plan: 33 y.o.  female with knee and shoulder pain a bit improved with some physical therapy.  Fortunately knee MRI was unremarkable.  We talked about continuing physical therapy which I think would be helpful.  Will try to get her set up with the [place] [facility] location as that may be a bit more convenient for her.  We did talk about return to exercise.  This should be done in coordination with physical therapy and possibly even a Systems analyst.  She can do some basic weight lifting especially if she uses machines and go slow and keep the weight light and avoids extremes of range of motion.\ Obviously she will need to pause both physical therapy and weight lifting Following her brain surgery in the near future.  Recheck with me as needed.   PDMP not reviewed this encounter. Orders Placed This Encounter  Procedures   Ambulatory referral to Physical Therapy    Referral Priority:   Routine    Referral Type:   Physical Medicine    Referral Reason:   Specialty Services Required    Requested Specialty:   Physical Therapy    Number of Visits Requested:   1   No orders of the defined types were placed in this encounter.    Discussed warning signs or symptoms. Please see discharge instructions. Patient expresses understanding.   The above documentation has been reviewed and is accurate and complete Artist Lloyd, M.D.  Total encounter time 30 minutes including face-to-face time with the patient and, reviewing past medical record, and charting on the date of service.

## 2024-08-19 ENCOUNTER — Ambulatory Visit (INDEPENDENT_AMBULATORY_CARE_PROVIDER_SITE_OTHER): Payer: MEDICAID | Admitting: Family Medicine

## 2024-08-19 ENCOUNTER — Encounter: Payer: Self-pay | Admitting: Family Medicine

## 2024-08-19 VITALS — BP 110/70 | HR 73 | Ht 64.0 in | Wt 115.0 lb

## 2024-08-19 DIAGNOSIS — G8929 Other chronic pain: Secondary | ICD-10-CM | POA: Diagnosis not present

## 2024-08-19 DIAGNOSIS — M25562 Pain in left knee: Secondary | ICD-10-CM

## 2024-08-19 DIAGNOSIS — M79642 Pain in left hand: Secondary | ICD-10-CM | POA: Diagnosis not present

## 2024-08-19 DIAGNOSIS — M25511 Pain in right shoulder: Secondary | ICD-10-CM | POA: Diagnosis not present

## 2024-08-19 NOTE — Patient Instructions (Addendum)
Thank you for coming in today.   I've referred you to Physical Therapy.  Let us know if you don't hear from them in one week.   Let me know how it goes

## 2024-08-23 ENCOUNTER — Inpatient Hospital Stay (HOSPITAL_BASED_OUTPATIENT_CLINIC_OR_DEPARTMENT_OTHER): Payer: MEDICAID | Admitting: Internal Medicine

## 2024-08-23 VITALS — BP 102/72 | HR 70 | Temp 97.3°F | Resp 18 | Wt 115.3 lb

## 2024-08-23 DIAGNOSIS — R569 Unspecified convulsions: Secondary | ICD-10-CM | POA: Diagnosis not present

## 2024-08-23 DIAGNOSIS — C719 Malignant neoplasm of brain, unspecified: Secondary | ICD-10-CM | POA: Diagnosis not present

## 2024-08-23 DIAGNOSIS — C712 Malignant neoplasm of temporal lobe: Secondary | ICD-10-CM | POA: Diagnosis not present
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
## 2024-08-24 ENCOUNTER — Encounter: Payer: Self-pay | Admitting: Family Medicine

## 2024-08-24 NOTE — Telephone Encounter (Signed)
 Forwarding to Dr. Denyse Amass as Lorain Childes.

## 2024-08-25 NOTE — Progress Notes (Unsigned)
   LILLETTE Ileana Collet, PhD, LAT, ATC acting as a scribe for Artist Lloyd, MD.  Sonya Prince is a 33 y.o. female who presents to [facility] today for L hand pain. Pt was previously seen by Dr. Lloyd on 08/19/24 R shoulder, L knee, and neck pain.  Today, pt c/o L hand pain x ***. Pt locates pain to ***   Pertinent review of systems: ***  Relevant historical information: ***   Exam:  There were no vitals taken for this visit. General: Well Developed, well nourished, and in no acute distress.   MSK: ***    Lab and Radiology Results No results found for this or any previous visit (from the past 72 hours). No results found.     Assessment and Plan: 33 y.o. female with ***   PDMP not reviewed this encounter. No orders of the defined types were placed in this encounter.  No orders of the defined types were placed in this encounter.    Discussed warning signs or symptoms. Please see discharge instructions. Patient expresses understanding.   ***

## 2024-08-26 ENCOUNTER — Ambulatory Visit: Payer: MEDICAID

## 2024-08-26 ENCOUNTER — Encounter: Payer: Self-pay | Admitting: Family Medicine

## 2024-08-26 ENCOUNTER — Ambulatory Visit: Payer: MEDICAID | Admitting: Family Medicine

## 2024-08-26 DIAGNOSIS — M79642 Pain in left hand: Secondary | ICD-10-CM

## 2024-08-26 NOTE — Addendum Note (Signed)
 Addended by: GEROME ILEANA RAMAN on: 08/26/2024 07:56 AM   Modules accepted: Orders

## 2024-08-26 NOTE — Telephone Encounter (Signed)
 Forwarding to Dr. Denyse Amass to review and advise.

## 2024-08-29 ENCOUNTER — Encounter: Payer: Self-pay | Admitting: Internal Medicine

## 2024-08-30 ENCOUNTER — Encounter: Payer: Self-pay | Admitting: *Deleted

## 2024-09-01 ENCOUNTER — Ambulatory Visit: Payer: Self-pay | Admitting: Family Medicine

## 2024-09-01 NOTE — Progress Notes (Signed)
 Left hand x-ray does not show any broken bones.

## 2024-09-05 ENCOUNTER — Other Ambulatory Visit: Payer: Self-pay

## 2024-09-05 ENCOUNTER — Ambulatory Visit: Attending: Family Medicine

## 2024-09-05 DIAGNOSIS — M542 Cervicalgia: Secondary | ICD-10-CM | POA: Diagnosis present

## 2024-09-05 DIAGNOSIS — M25511 Pain in right shoulder: Secondary | ICD-10-CM | POA: Insufficient documentation

## 2024-09-05 DIAGNOSIS — M25562 Pain in left knee: Secondary | ICD-10-CM | POA: Diagnosis present

## 2024-09-05 DIAGNOSIS — G8929 Other chronic pain: Secondary | ICD-10-CM | POA: Diagnosis present
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
## 2024-09-06 ENCOUNTER — Ambulatory Visit: Admitting: Physical Therapy

## 2024-09-06 DIAGNOSIS — G8929 Other chronic pain: Secondary | ICD-10-CM

## 2024-09-06 DIAGNOSIS — M25511 Pain in right shoulder: Secondary | ICD-10-CM | POA: Diagnosis not present
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
## 2024-09-12 ENCOUNTER — Ambulatory Visit

## 2024-09-12 DIAGNOSIS — G8929 Other chronic pain: Secondary | ICD-10-CM

## 2024-09-12 DIAGNOSIS — M25511 Pain in right shoulder: Secondary | ICD-10-CM | POA: Diagnosis not present

## 2024-09-12 DIAGNOSIS — M542 Cervicalgia: Secondary | ICD-10-CM
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
## 2024-09-16 ENCOUNTER — Ambulatory Visit

## 2024-09-16 ENCOUNTER — Encounter: Payer: Self-pay | Admitting: Internal Medicine

## 2024-09-20 ENCOUNTER — Ambulatory Visit

## 2024-09-20 DIAGNOSIS — G8929 Other chronic pain: Secondary | ICD-10-CM

## 2024-09-20 DIAGNOSIS — M25511 Pain in right shoulder: Secondary | ICD-10-CM | POA: Diagnosis not present

## 2024-09-20 DIAGNOSIS — M542 Cervicalgia: Secondary | ICD-10-CM
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
## 2024-09-23 ENCOUNTER — Ambulatory Visit: Admitting: Physical Therapy

## 2024-09-23 DIAGNOSIS — M25511 Pain in right shoulder: Secondary | ICD-10-CM | POA: Diagnosis not present

## 2024-09-23 DIAGNOSIS — G8929 Other chronic pain: Secondary | ICD-10-CM
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
## 2024-09-27 ENCOUNTER — Ambulatory Visit

## 2024-09-27 DIAGNOSIS — M542 Cervicalgia: Secondary | ICD-10-CM

## 2024-09-27 DIAGNOSIS — G8929 Other chronic pain: Secondary | ICD-10-CM

## 2024-09-27 DIAGNOSIS — M25511 Pain in right shoulder: Secondary | ICD-10-CM | POA: Diagnosis not present
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
## 2024-09-30 ENCOUNTER — Ambulatory Visit

## 2024-10-03 ENCOUNTER — Ambulatory Visit

## 2024-10-04 ENCOUNTER — Telehealth: Payer: Self-pay | Admitting: Internal Medicine

## 2024-10-04 NOTE — Telephone Encounter (Signed)
 I left voicemail for patient regarding scheduled appointment on 10/18/2024 with Dr. Buckley per staff message. I advised patient to give me a call back if time/date does not work for her.

## 2024-10-07 ENCOUNTER — Ambulatory Visit

## 2024-10-18 ENCOUNTER — Inpatient Hospital Stay: Payer: Self-pay | Attending: Internal Medicine | Admitting: Internal Medicine

## 2024-10-18 ENCOUNTER — Other Ambulatory Visit: Payer: Self-pay | Admitting: *Deleted

## 2024-10-18 ENCOUNTER — Ambulatory Visit
Admission: RE | Admit: 2024-10-18 | Discharge: 2024-10-18 | Disposition: A | Discharge status: Home / Self Care | Payer: Self-pay | Source: Ambulatory Visit | Attending: Internal Medicine | Admitting: Internal Medicine

## 2024-10-18 ENCOUNTER — Encounter: Payer: Self-pay | Admitting: *Deleted

## 2024-10-18 VITALS — BP 140/89 | HR 83 | Temp 98.1°F | Resp 18 | Ht 64.0 in | Wt 126.0 lb

## 2024-10-18 DIAGNOSIS — C719 Malignant neoplasm of brain, unspecified: Secondary | ICD-10-CM | POA: Diagnosis not present

## 2024-10-18 DIAGNOSIS — R569 Unspecified convulsions: Secondary | ICD-10-CM

## 2024-10-18 NOTE — Progress Notes (Signed)
 Requested any brain imaging during the month of October from [facility], outside order placed, email to [employer] to link imaging to order.

## 2024-10-18 NOTE — Progress Notes (Signed)
 [facility] at [facility] [address]  [place], [place] [address] [phone]   Interval Evaluation  Date of Service: 10/18/24 Patient Name: Sonya Prince Patient MRN: [id number] Patient DOB: 27-Apr-1991 Provider: [name], MD  Identifying Statement:  Sonya Prince is a 33 y.o. 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 Debby 09/29/24: Repeat craniotomy following tumor recurrence at [facility] with Dr. Saturnino  Biomarkers:  MGMT Unknown .  IDH 1/2 Unknown.  MN1 Altered  Ki-67 5%   Interval History:  Sonya Prince presents today for follow up after recent craniotomy at [facility].  She tolerated surgery very well, no complications.  She continues on the Lamictal  100mg  daily, seizure free.  Has weaned off the post-op steroids.  No new or progressive changes otherwise.  No functional limitations at present.   Prior: She describes no new neurologic symptoms, MRI was obtained due to concerns following head trauma.  Overall she is doing better following incarceration, staying at home with her parents.  No reported seizures, compliance with Trileptal  has been inconsistent per patient.  Continues to follow closely with her psychiatrist.      H+P. Patient presented to medical attention with a seizure or series of seizures associated with loss of consciousness.  Further details of the seizure events are not well described or recalled.  There was also a seizure event in 2020 and another going back to 2019, per patient and records.  CNS imaging demonstrated a large, enhancing left temporal mass.  Associated calcifications had been noted going back to trauma CT head eval in 2017.  At this time she is back at baseline, no recurrent seizure events.  She denies any other focal or progressive neurologic symptoms.  Currently living at home with her father.    Medications: Current Outpatient  Medications on File Prior to Visit  Medication Sig Dispense Refill   acetaminophen  (TYLENOL ) 325 MG tablet Take 975 mg by mouth every 6 (six) hours as needed for moderate pain (pain score 4-6).     lamoTRIgine  (LAMICTAL ) 100 MG tablet Take 1 tablet (100 mg total) by mouth daily. 60 tablet 1   No current facility-administered medications on file prior to visit.    Allergies:  Allergies  Allergen Reactions   Doxycycline  Swelling   Past Medical History:  Past Medical History:  Diagnosis Date   Anxiety    Bipolar disorder (HCC)    Father denies diagnosis   Mass of left temporal lobe    Meningitis    PTSD (post-traumatic stress disorder)    Seizures (HCC)    Past Surgical History:  Past Surgical History:  Procedure Laterality Date   APPLICATION OF CRANIAL NAVIGATION N/A 07/05/2020   Procedure: APPLICATION OF CRANIAL NAVIGATION;  Surgeon: Debby Dorn MATSU, MD;  Location: [facility] OR;  Service: Neurosurgery;  Laterality: N/A;  APPLICATION OF CRANIAL NAVIGATION   CRANIOTOMY Left 07/05/2020   Procedure: CRANIOTOMY LEFT TEMPORAL FOR TUMOR RESECTION;  Surgeon: Debby Dorn MATSU, MD;  Location: [facility] OR;  Service: Neurosurgery;  Laterality: Left;  CRANIOTOMY LEFT TEMPORAL FOR TUMOR RESECTION   OPEN REDUCTION INTERNAL FIXATION (ORIF) METACARPAL Right 12/19/2019   Procedure: OPEN TREATMENT OF RIGHT 5TH METACARPAL FRACTURE;  Surgeon: Sebastian Lenis, MD;  Location: [facility];  Service: Orthopedics;  Laterality: Right;  WITH PREOP REGIONAL BLOCK   OPERATIVE ULTRASOUND Left 07/05/2020   Procedure: OPERATIVE ULTRASOUND;  Surgeon: Debby Dorn  G, MD;  Location: [facility] OR;  Service: Neurosurgery;  Laterality: Left;   WISDOM TOOTH EXTRACTION     Social History:  Social History   Socioeconomic History   Marital status: Single    Spouse name: Not on file   Number of children: Not on file   Years of education: Not on file   Highest education level: Not on file  Occupational History   Not on  file  Tobacco Use   Smoking status: Every Day    Current packs/day: 0.50    Types: Cigarettes   Smokeless tobacco: Former  Building Services Engineer status: Every Day  Substance and Sexual Activity   Alcohol use: Yes    Comment: 3 times a month    Drug use: Yes    Types: Marijuana   Sexual activity: Yes  Other Topics Concern   Not on file  Social History Narrative   Not on file   Social Drivers of Health   Financial Resource Strain: Low Risk  (10/03/2024)   Received from [facility]   Overall Financial Resource Strain (CARDIA)    Difficulty of Paying Living Expenses: Not hard at all  Food Insecurity: No Food Insecurity (10/03/2024)   Received from [facility]   Hunger Vital Sign    Within the past 12 months, you worried that your food would run out before you got the money to buy more.: Never true    Within the past 12 months, the food you bought just didn't last and you didn't have money to get more.: Never true  Transportation Needs: No Transportation Needs (10/03/2024)   Received from [facility] - Transportation    In the past 12 months, has lack of transportation kept you from medical appointments or from getting medications?: No    Lack of Transportation (Non-Medical): No  Physical Activity: Not on file  Stress: Not on file  Social Connections: Unknown (04/05/2022)   Received from [facility]   Social Network    Social Network: Not on file  Intimate Partner Violence: Unknown (03/03/2022)   Received from [facility]   HITS    Physically Hurt: Not on file    Insult or Talk Down To: Not on file    Threaten Physical Harm: Not on file    Scream or Curse: Not on file   Family History:  Family History  Family history unknown: Yes    Review of Systems: Constitutional: Doesn't report fevers, chills or abnormal weight loss Eyes: Doesn't report blurriness of vision Ears, nose, mouth, throat, and face: Doesn't  report sore throat Respiratory: Doesn't report cough, dyspnea or wheezes Cardiovascular: Doesn't report palpitation, chest discomfort  Gastrointestinal:  Doesn't report nausea, constipation, diarrhea GU: Doesn't report incontinence Skin: Doesn't report skin rashes Neurological: Per HPI Musculoskeletal: Doesn't report joint pain Behavioral/Psych: Doesn't report anxiety  Physical Exam: Vitals:   10/18/24 1043  BP: (!) 140/89  Pulse: 83  Resp: 18  Temp: 98.1 F (36.7 C)  SpO2: 99%    KPS: 90. General: Alert, cooperative, pleasant, in no acute distress Head: Normal EENT: No conjunctival injection or scleral icterus.  Lungs: Resp effort normal Cardiac: Regular rate Abdomen: Non-distended abdomen Skin: No rashes cyanosis or petechiae. Extremities: No clubbing or edema  Neurologic Exam: Mental Status: Awake, alert, attentive to examiner. Oriented to self and environment. Language is fluent with intact comprehension.  Cranial Nerves: Visual acuity is grossly normal. Visual fields are  full. Extra-ocular movements intact. No ptosis. Face is symmetric Motor: Tone and bulk are normal. Power is full in both arms and legs. Reflexes are symmetric, no pathologic reflexes present.  Sensory: Intact to light touch Gait: Normal.   Labs: I have reviewed the data as listed    Component Value Date/Time   NA 136 07/23/2024 1602   K 3.7 07/23/2024 1602   CL 103 07/23/2024 1602   CO2 25 07/23/2024 1602   GLUCOSE 102 (H) 07/23/2024 1602   BUN 11 07/23/2024 1602   CREATININE 0.67 07/23/2024 1602   CALCIUM 9.4 07/23/2024 1602   PROT 7.9 07/23/2024 1602   ALBUMIN 4.3 07/23/2024 1602   AST 18 07/23/2024 1602   ALT 18 07/23/2024 1602   ALKPHOS 51 07/23/2024 1602   BILITOT 0.6 07/23/2024 1602   GFRNONAA >60 07/23/2024 1602   GFRAA >60 07/06/2020 0500   Lab Results  Component Value Date   WBC 10.0 07/23/2024   NEUTROABS 7.0 07/23/2024   HGB 14.1 07/23/2024   HCT 42.2 07/23/2024   MCV  94.4 07/23/2024   PLT 255 07/23/2024    Imaging: MRI BRAIN WITHOUT AND WITH CONTRAST   INDICATION: brain tumor, D49.6 Neoplasm of unspecified behavior of brain  (CMS/HHS-HCC)   ADDITIONAL CLINICAL HISTORY: Patient has a history of astroblastoma which  was resected in 2022 and presented with recurrence on MRI. Patient is now  status post reresection.   BASELINE COMPARISON: May 22, 2020 MRI head  ADDITIONAL COMPARISON(S): MRI head dated July 30, 2024   TECHNIQUE/PROTOCOL: Adult brain tumor protocol without and with IV  contrast.   FINDINGS:  Postoperative changes of left parieto-occipital craniotomy with subjacent  resection cavity redemonstrated. Surrounding increased signal on T2  weighted imaging is unchanged. Nonspecific enhancement about the left  frontal horn (10:117) and left parietal lobe (18:135) which were not seen  on prior examination.   Other Brain Parenchyma:  No hemorrhage or acute cortical infarction.  No  midline shift.  Ventricles: No hydrocephalus. Pneumoventricle in the left frontal horn.  Extra-Axial Spaces: No extra-axial fluid collection.  Basal Cisterns: The basal cisterns are maintained.  Intracranial Flow-Voids: Normal.   Paranasal Sinuses: Normal.  Mastoids: Normal.  Orbits: Increased fluid in the optic nerve complexes, nonspecific and may  be postoperative.  Visualized upper cervical spine: No high grade stenosis.    IMPRESSION:  1.  Postsurgical changes of left temporal lobe tumor resection with likely  postoperative surrounding T2/FLAIR hyperintensity.  2.  Nonspecific enhancement at the margin of the frontal horn of the left  lateral ventricle and punctate focus subcortical high parietal lobe. New  compared to preoperative previous day exam. Attention on follow-up is  recommended.   3.  Small volume pneumoventricle most notably in the frontal horn of the  left lateral ventricle is thought to be evolving postsurgical.   The preliminary  report (critical or emergent communication) was reviewed  prior to this dictation and there are no substantial differences between  the preliminary results and the impressions in this final report.    Electronically Reviewed by:  Velma Millet, MD, [facility]  Electronically Reviewed on:  09/30/2024 10:07 AM    Assessment/Plan Astroblastoma (HCC) [C71.9]  Sonya Prince is clinically stable today, following re-resection of locally recurrent Astroblastoma.  Post-op MRI demonstrated several foci of enhancement of uncertain etiology; we are still waiting on physical images to be crossed over for viewing.     Goals of care were discussed again today in detail.  We will likely recommend adjuvant IMRT radiation, but will need repeat MRI brain to follow up on post-op MRI findings.  She already has this scheduled for 11/22/24 prior to follow up with Dr. Saturnino.  We also discussed and patient consented for additional tumor profiling and sequencing through [facility].  Advanced tumor profiling could help identify actionable mutation for targeted therapy and lead to direct clinical benefit.     Recommended otherwise continuing Lamictal  100mg  daily for seizure prevention.  We ask that Sonya Prince return to clinic next month following MRI.  All questions were answered. The patient knows to call the clinic with any problems, questions or concerns. No barriers to learning were detected.  The total time spent in the encounter was 40 minutes and more than 50% was on counseling and review of test results   [name], MD Medical Director of Neuro-Oncology [facility] 10/18/24 11:02 AM

## 2024-10-19 ENCOUNTER — Telehealth: Payer: Self-pay | Admitting: *Deleted

## 2024-10-19 ENCOUNTER — Telehealth: Payer: Self-pay | Admitting: Internal Medicine

## 2024-10-19 NOTE — Telephone Encounter (Signed)
 Sonya Prince left a message stating she did not receive the refill of acetaminophen . States it was not originally filled by Dr Buckley. It is Acetaminophen  325 mg, take 975 mg every 6 hours as needed for moderate pain.

## 2024-10-19 NOTE — Telephone Encounter (Signed)
 Scheduled patient for next appointment. Called and spoke with the patient, she is aware. She said that she might call back to change it.

## 2024-10-24 ENCOUNTER — Telehealth: Payer: Self-pay | Admitting: *Deleted

## 2024-10-24 NOTE — Telephone Encounter (Signed)
 Molecular Profiling Test Request Form faxed to [facility], along with pathology report from [facility], Dr Eward most recent office note & MRI report from [facility].  Faxed to [phone], fax confirmation received.

## 2024-11-02 ENCOUNTER — Other Ambulatory Visit: Payer: Self-pay

## 2024-11-02 ENCOUNTER — Ambulatory Visit

## 2024-11-02 DIAGNOSIS — M542 Cervicalgia: Secondary | ICD-10-CM | POA: Diagnosis present

## 2024-11-02 DIAGNOSIS — M25562 Pain in left knee: Secondary | ICD-10-CM | POA: Diagnosis present

## 2024-11-02 DIAGNOSIS — M25511 Pain in right shoulder: Secondary | ICD-10-CM | POA: Diagnosis present

## 2024-11-02 DIAGNOSIS — G8929 Other chronic pain: Secondary | ICD-10-CM | POA: Insufficient documentation
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
## 2024-11-04 ENCOUNTER — Encounter: Payer: Self-pay | Admitting: Internal Medicine

## 2024-11-07 ENCOUNTER — Ambulatory Visit: Admitting: Physical Therapy

## 2024-11-07 ENCOUNTER — Ambulatory Visit

## 2024-11-07 ENCOUNTER — Encounter: Payer: Self-pay | Admitting: Physical Therapy

## 2024-11-07 DIAGNOSIS — G8929 Other chronic pain: Secondary | ICD-10-CM

## 2024-11-07 DIAGNOSIS — M542 Cervicalgia: Secondary | ICD-10-CM

## 2024-11-07 DIAGNOSIS — M25511 Pain in right shoulder: Secondary | ICD-10-CM | POA: Diagnosis not present
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
## 2024-11-08 ENCOUNTER — Ambulatory Visit: Admitting: Physical Therapy

## 2024-11-10 ENCOUNTER — Ambulatory Visit: Admitting: Physical Therapy

## 2024-11-10 ENCOUNTER — Encounter: Payer: Self-pay | Admitting: Internal Medicine

## 2024-11-14 ENCOUNTER — Telehealth: Payer: Self-pay

## 2024-11-14 NOTE — Telephone Encounter (Signed)
 S/w patient after receiving message to schedule appointments. Informed patient that she was already scheduled to see Dr. Buckley on Monday, 12/29 at 9:00 AM.  Patient confirmed appointment date and time. All questions answered at time of call.

## 2024-11-22 ENCOUNTER — Ambulatory Visit: Admitting: Physical Therapy

## 2024-11-28 ENCOUNTER — Inpatient Hospital Stay: Admitting: Internal Medicine

## 2024-11-28 ENCOUNTER — Inpatient Hospital Stay: Attending: Internal Medicine | Admitting: Internal Medicine

## 2024-11-28 ENCOUNTER — Other Ambulatory Visit: Payer: Self-pay

## 2024-11-28 VITALS — BP 112/70 | HR 92 | Temp 97.3°F | Resp 20 | Wt 135.5 lb

## 2024-11-28 DIAGNOSIS — C719 Malignant neoplasm of brain, unspecified: Secondary | ICD-10-CM | POA: Diagnosis not present

## 2024-11-28 DIAGNOSIS — C712 Malignant neoplasm of temporal lobe: Secondary | ICD-10-CM | POA: Insufficient documentation

## 2024-11-28 DIAGNOSIS — F17290 Nicotine dependence, other tobacco product, uncomplicated: Secondary | ICD-10-CM | POA: Diagnosis not present

## 2024-11-28 DIAGNOSIS — F17210 Nicotine dependence, cigarettes, uncomplicated: Secondary | ICD-10-CM | POA: Insufficient documentation

## 2024-11-28 NOTE — Progress Notes (Unsigned)
 Called patient due to not showing up for her 9 am appointment with Dr. Buckley. No answer to call left a detailed message to call up back to be rescheduled. Contacted her mom and she stated she would call the patient or her dad and get her to call us  back.

## 2024-11-28 NOTE — Progress Notes (Signed)
 "  [facility] [address]  [place], [place] [address] [phone]   Interval Evaluation  Date of Service: 11/28/2024 Patient Name: Sonya Prince Patient MRN: [id number] Patient DOB: 1991-07-14 Provider: [name], MD  Identifying Statement:  Sonya Prince is a 33 y.o. 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 Debby 09/29/24: Repeat craniotomy following tumor recurrence at [facility] with Dr. Saturnino  Biomarkers:  MGMT Unknown .  IDH 1/2 Unknown.  MN1 Altered  Ki-67 5%   Interval History:  Sonya Prince presents today for follow up after recent consultation at [facility].  She continues to recover well following surgery.  She continues on the Lamictal  100mg  daily, seizure free.  No further steroids.  No new or progressive changes otherwise.  No functional limitations at present.   Prior: She describes no new neurologic symptoms, MRI was obtained due to concerns following head trauma.  Overall she is doing better following incarceration, staying at home with her parents.  No reported seizures, compliance with Trileptal  has been inconsistent per patient.  Continues to follow closely with her psychiatrist.      H+P. Patient presented to medical attention with a seizure or series of seizures associated with loss of consciousness.  Further details of the seizure events are not well described or recalled.  There was also a seizure event in 2020 and another going back to 2019, per patient and records.  CNS imaging demonstrated a large, enhancing left temporal mass.  Associated calcifications had been noted going back to trauma CT head eval in 2017.  At this time she is back at baseline, no recurrent seizure events.  She denies any other focal or progressive neurologic symptoms.  Currently living at home with her father.    Medications: Current Outpatient Medications on  File Prior to Visit  Medication Sig Dispense Refill   acetaminophen  (TYLENOL ) 325 MG tablet Take 975 mg by mouth every 6 (six) hours as needed for moderate pain (pain score 4-6).     lamoTRIgine  (LAMICTAL ) 100 MG tablet Take 1 tablet (100 mg total) by mouth daily. 60 tablet 1   No current facility-administered medications on file prior to visit.    Allergies:  Allergies  Allergen Reactions   Doxycycline  Swelling   Past Medical History:  Past Medical History:  Diagnosis Date   Anxiety    Bipolar disorder (HCC)    Father denies diagnosis   Mass of left temporal lobe    Meningitis    PTSD (post-traumatic stress disorder)    Seizures (HCC)    Past Surgical History:  Past Surgical History:  Procedure Laterality Date   APPLICATION OF CRANIAL NAVIGATION N/A 07/05/2020   Procedure: APPLICATION OF CRANIAL NAVIGATION;  Surgeon: Debby Dorn MATSU, MD;  Location[facility] OR;  Service: Neurosurgery;  Laterality: N/A;  APPLICATION OF CRANIAL NAVIGATION   CRANIOTOMY Left 07/05/2020   Procedure: CRANIOTOMY LEFT TEMPORAL FOR TUMOR RESECTION;  Surgeon: Debby Dorn MATSU, MD;  Location: [facility] OR;  Service: Neurosurgery;  Laterality: Left;  CRANIOTOMY LEFT TEMPORAL FOR TUMOR RESECTION   OPEN REDUCTION INTERNAL FIXATION (ORIF) METACARPAL Right 12/19/2019   Procedure: OPEN TREATMENT OF RIGHT 5TH METACARPAL FRACTURE;  Surgeon: Sebastian Lenis, MD;  Location: [facility];  Service: Orthopedics;  Laterality: Right;  WITH PREOP REGIONAL BLOCK   OPERATIVE ULTRASOUND Left 07/05/2020   Procedure: OPERATIVE ULTRASOUND;  Surgeon: Debby Dorn MATSU, MD;  Location: [facility] OR;  Service: Neurosurgery;  Laterality: Left;   WISDOM TOOTH EXTRACTION     Social History:  Social History   Socioeconomic History   Marital status: Single    Spouse name: Not on file   Number of children: Not on file   Years of education: Not on file   Highest education level: Not on file  Occupational History   Not on file  Tobacco  Use   Smoking status: Every Day    Current packs/day: 0.50    Types: Cigarettes   Smokeless tobacco: Former  Building Services Engineer status: Every Day  Substance and Sexual Activity   Alcohol use: Yes    Comment: 3 times a month    Drug use: Yes    Types: Marijuana   Sexual activity: Yes  Other Topics Concern   Not on file  Social History Narrative   Not on file   Social Drivers of Health   Tobacco Use: High Risk (11/07/2024)   Patient History    Smoking Tobacco Use: Every Day    Smokeless Tobacco Use: Former    Passive Exposure: Not on Actuary Strain: Low Risk  (10/03/2024)   Received from [facility]   Overall Financial Resource Strain (CARDIA)    Difficulty of Paying Living Expenses: Not hard at all  Food Insecurity: No Food Insecurity (10/03/2024)   Received from [facility]   Epic    Within the past 12 months, you worried that your food would run out before you got the money to buy more.: Never true    Within the past 12 months, the food you bought just didn't last and you didn't have money to get more.: Never true  Transportation Needs: No Transportation Needs (10/03/2024)   Received from [facility] - Transportation    In the past 12 months, has lack of transportation kept you from medical appointments or from getting medications?: No    Lack of Transportation (Non-Medical): No  Physical Activity: Not on file  Stress: Not on file  Social Connections: Unknown (04/05/2022)   Received from [facility]   Social Network    Social Network: Not on file  Intimate Partner Violence: Unknown (03/03/2022)   Received from [facility]   HITS    Physically Hurt: Not on file    Insult or Talk Down To: Not on file    Threaten Physical Harm: Not on file    Scream or Curse: Not on file  Depression (PHQ2-9): Low Risk (10/18/2024)   Depression (PHQ2-9)    PHQ-2 Score: 0  Alcohol Screen: Not on file   Housing: Not on file  Utilities: Not on file  Health Literacy: Not on file   Family History:  Family History  Family history unknown: Yes    Review of Systems: Constitutional: Doesn't report fevers, chills or abnormal weight loss Eyes: Doesn't report blurriness of vision Ears, nose, mouth, throat, and face: Doesn't report sore throat Respiratory: Doesn't report cough, dyspnea or wheezes Cardiovascular: Doesn't report palpitation, chest discomfort  Gastrointestinal:  Doesn't report nausea, constipation, diarrhea GU: Doesn't report incontinence Skin: Doesn't report skin rashes Neurological: Per HPI Musculoskeletal: Doesn't report joint pain Behavioral/Psych: Doesn't report anxiety  Physical Exam: Vitals:   11/28/24 1508  BP: 112/70  Pulse: 92  Resp: 20  Temp: (!) 97.3 F (36.3 C)  SpO2: 98%    KPS: 90. General: Alert, cooperative, pleasant, in  no acute distress Head: Normal EENT: No conjunctival injection or scleral icterus.  Lungs: Resp effort normal Cardiac: Regular rate Abdomen: Non-distended abdomen Skin: No rashes cyanosis or petechiae. Extremities: No clubbing or edema  Neurologic Exam: Mental Status: Awake, alert, attentive to examiner. Oriented to self and environment. Language is fluent with intact comprehension.  Cranial Nerves: Visual acuity is grossly normal. Visual fields are full. Extra-ocular movements intact. No ptosis. Face is symmetric Motor: Tone and bulk are normal. Power is full in both arms and legs. Reflexes are symmetric, no pathologic reflexes present.  Sensory: Intact to light touch Gait: Normal.   Labs: I have reviewed the data as listed    Component Value Date/Time   NA 136 07/23/2024 1602   K 3.7 07/23/2024 1602   CL 103 07/23/2024 1602   CO2 25 07/23/2024 1602   GLUCOSE 102 (H) 07/23/2024 1602   BUN 11 07/23/2024 1602   CREATININE 0.67 07/23/2024 1602   CALCIUM 9.4 07/23/2024 1602   PROT 7.9 07/23/2024 1602   ALBUMIN 4.3  07/23/2024 1602   AST 18 07/23/2024 1602   ALT 18 07/23/2024 1602   ALKPHOS 51 07/23/2024 1602   BILITOT 0.6 07/23/2024 1602   GFRNONAA >60 07/23/2024 1602   GFRAA >60 07/06/2020 0500   Lab Results  Component Value Date   WBC 10.0 07/23/2024   NEUTROABS 7.0 07/23/2024   HGB 14.1 07/23/2024   HCT 42.2 07/23/2024   MCV 94.4 07/23/2024   PLT 255 07/23/2024    Imaging: MRI BRAIN WITHOUT AND WITH CONTRAST   INDICATION: brain tumor, D49.6 Neoplasm of unspecified behavior of brain  (CMS/HHS-HCC)   ADDITIONAL CLINICAL HISTORY: Patient has a history of astroblastoma which  was resected in 2022 and presented with recurrence on MRI. Patient is now  status post reresection.   BASELINE COMPARISON: May 22, 2020 MRI head  ADDITIONAL COMPARISON(S): MRI head dated July 30, 2024   TECHNIQUE/PROTOCOL: Adult brain tumor protocol without and with IV  contrast.   FINDINGS:  Postoperative changes of left parieto-occipital craniotomy with subjacent  resection cavity redemonstrated. Surrounding increased signal on T2  weighted imaging is unchanged. Nonspecific enhancement about the left  frontal horn (10:117) and left parietal lobe (18:135) which were not seen  on prior examination.   Other Brain Parenchyma:  No hemorrhage or acute cortical infarction.  No  midline shift.  Ventricles: No hydrocephalus. Pneumoventricle in the left frontal horn.  Extra-Axial Spaces: No extra-axial fluid collection.  Basal Cisterns: The basal cisterns are maintained.  Intracranial Flow-Voids: Normal.   Paranasal Sinuses: Normal.  Mastoids: Normal.  Orbits: Increased fluid in the optic nerve complexes, nonspecific and may  be postoperative.  Visualized upper cervical spine: No high grade stenosis.    IMPRESSION:  1.  Postsurgical changes of left temporal lobe tumor resection with likely  postoperative surrounding T2/FLAIR hyperintensity.  2.  Nonspecific enhancement at the margin of the frontal horn  of the left  lateral ventricle and punctate focus subcortical high parietal lobe. New  compared to preoperative previous day exam. Attention on follow-up is  recommended.   3.  Small volume pneumoventricle most notably in the frontal horn of the  left lateral ventricle is thought to be evolving postsurgical.   The preliminary report (critical or emergent communication) was reviewed  prior to this dictation and there are no substantial differences between  the preliminary results and the impressions in this final report.    Electronically Reviewed by:  Velma Millet, MD, [facility]  Electronically Reviewed on:  09/30/2024 10:07 AM    Assessment/Plan Astroblastoma (HCC) [C71.9]  BERNADETT MILIAN is clinically stable today, following re-resection of locally recurrent Astroblastoma.    Goals of care were discussed again today in detail.  We have recommend 6 weeks adjuvant IMRT radiation, and it would be reasonable per [facility] team suggestion to add concurrent Temozolomide.  She does not consent at this time for either treatment, in particular the chemotherapy.  She is more open to radiation, but would like to wait to regain more strength following surgery.    We agreed to meet in the middle, obtain an updated MRI study in 4-6 weeks.  We are still pending additional tumor profiling and sequencing through [facility].  Advanced tumor profiling could help identify actionable mutation for targeted therapy and lead to direct clinical benefit.     Recommended otherwise continuing Lamictal  100mg  daily for seizure prevention.  We ask that BEVELYN ARRIOLA return to clinic in 4-6 weeks following updated MRI brain.  All questions were answered. The patient knows to call the clinic with any problems, questions or concerns. No barriers to learning were detected.  The total time spent in the encounter was 40 minutes and more than 50% was on counseling and review of test results   [name],  MD Medical Director of Neuro-Oncology [facility] at [place] 11/28/2024 4:22 PM "

## 2024-11-29 ENCOUNTER — Ambulatory Visit: Admitting: Physical Therapy

## 2024-12-02 ENCOUNTER — Ambulatory Visit: Admitting: Physical Therapy

## 2025-01-06 ENCOUNTER — Ambulatory Visit (HOSPITAL_COMMUNITY)

## 2025-01-08 ENCOUNTER — Ambulatory Visit (HOSPITAL_COMMUNITY)

## 2025-01-09 ENCOUNTER — Inpatient Hospital Stay: Attending: Internal Medicine | Admitting: Internal Medicine
